# Patient Record
Sex: Female | Born: 1940 | Race: White | Hispanic: No | Smoking: Current every day smoker
Health system: Southern US, Community
[De-identification: ages and names within clinical notes are randomized; demographics above are authoritative.]

## PROBLEM LIST (undated history)

## (undated) DIAGNOSIS — E119 Type 2 diabetes mellitus without complications: Secondary | ICD-10-CM

## (undated) DIAGNOSIS — B029 Zoster without complications: Secondary | ICD-10-CM

## (undated) DIAGNOSIS — N289 Disorder of kidney and ureter, unspecified: Secondary | ICD-10-CM

## (undated) DIAGNOSIS — I509 Heart failure, unspecified: Secondary | ICD-10-CM

## (undated) DIAGNOSIS — I10 Essential (primary) hypertension: Secondary | ICD-10-CM

## (undated) HISTORY — DX: Disorder of kidney and ureter, unspecified: N28.9

## (undated) HISTORY — DX: Zoster without complications: B02.9

---

## 2010-11-26 ENCOUNTER — Other Ambulatory Visit: Payer: Self-pay | Admitting: Nephrology

## 2010-11-26 DIAGNOSIS — N181 Chronic kidney disease, stage 1: Secondary | ICD-10-CM

## 2010-12-01 ENCOUNTER — Ambulatory Visit
Admission: RE | Admit: 2010-12-01 | Discharge: 2010-12-01 | Disposition: A | Discharge status: Home / Self Care | Payer: Medicare Other | Source: Ambulatory Visit | Attending: Nephrology | Admitting: Nephrology

## 2010-12-01 DIAGNOSIS — N181 Chronic kidney disease, stage 1: Secondary | ICD-10-CM

## 2014-01-22 ENCOUNTER — Other Ambulatory Visit (HOSPITAL_BASED_OUTPATIENT_CLINIC_OR_DEPARTMENT_OTHER): Payer: Self-pay | Admitting: Family Medicine

## 2014-01-22 DIAGNOSIS — R0609 Other forms of dyspnea: Principal | ICD-10-CM

## 2014-01-24 ENCOUNTER — Ambulatory Visit (HOSPITAL_BASED_OUTPATIENT_CLINIC_OR_DEPARTMENT_OTHER)
Admission: RE | Admit: 2014-01-24 | Discharge: 2014-01-24 | Disposition: A | Discharge status: Home / Self Care | Payer: Medicare Other | Source: Ambulatory Visit | Attending: Family Medicine | Admitting: Family Medicine

## 2014-01-24 DIAGNOSIS — I509 Heart failure, unspecified: Secondary | ICD-10-CM | POA: Insufficient documentation

## 2014-01-24 DIAGNOSIS — R0602 Shortness of breath: Secondary | ICD-10-CM | POA: Diagnosis present

## 2014-01-24 DIAGNOSIS — J811 Chronic pulmonary edema: Secondary | ICD-10-CM | POA: Diagnosis not present

## 2014-01-24 DIAGNOSIS — J90 Pleural effusion, not elsewhere classified: Secondary | ICD-10-CM | POA: Diagnosis not present

## 2014-01-24 DIAGNOSIS — R0609 Other forms of dyspnea: Secondary | ICD-10-CM

## 2015-05-29 DIAGNOSIS — L0100 Impetigo, unspecified: Secondary | ICD-10-CM | POA: Insufficient documentation

## 2015-05-29 DIAGNOSIS — I5033 Acute on chronic diastolic (congestive) heart failure: Secondary | ICD-10-CM | POA: Insufficient documentation

## 2016-01-27 ENCOUNTER — Inpatient Hospital Stay (HOSPITAL_COMMUNITY): Payer: Medicare Other

## 2016-01-27 ENCOUNTER — Encounter (HOSPITAL_COMMUNITY): Payer: Self-pay | Admitting: Emergency Medicine

## 2016-01-27 ENCOUNTER — Inpatient Hospital Stay (HOSPITAL_COMMUNITY)
Admission: EM | Admit: 2016-01-27 | Discharge: 2016-02-05 | DRG: 291 | Disposition: A | Discharge status: Skilled Nursing Facility | Payer: Medicare Other | Attending: Internal Medicine | Admitting: Internal Medicine

## 2016-01-27 ENCOUNTER — Emergency Department (HOSPITAL_COMMUNITY): Payer: Medicare Other

## 2016-01-27 DIAGNOSIS — I509 Heart failure, unspecified: Secondary | ICD-10-CM | POA: Diagnosis not present

## 2016-01-27 DIAGNOSIS — F17210 Nicotine dependence, cigarettes, uncomplicated: Secondary | ICD-10-CM | POA: Diagnosis present

## 2016-01-27 DIAGNOSIS — Z6841 Body Mass Index (BMI) 40.0 and over, adult: Secondary | ICD-10-CM

## 2016-01-27 DIAGNOSIS — Z7984 Long term (current) use of oral hypoglycemic drugs: Secondary | ICD-10-CM | POA: Diagnosis not present

## 2016-01-27 DIAGNOSIS — Z7189 Other specified counseling: Secondary | ICD-10-CM

## 2016-01-27 DIAGNOSIS — I872 Venous insufficiency (chronic) (peripheral): Secondary | ICD-10-CM | POA: Diagnosis present

## 2016-01-27 DIAGNOSIS — Z9119 Patient's noncompliance with other medical treatment and regimen: Secondary | ICD-10-CM

## 2016-01-27 DIAGNOSIS — Z882 Allergy status to sulfonamides status: Secondary | ICD-10-CM

## 2016-01-27 DIAGNOSIS — I248 Other forms of acute ischemic heart disease: Secondary | ICD-10-CM | POA: Diagnosis present

## 2016-01-27 DIAGNOSIS — N170 Acute kidney failure with tubular necrosis: Secondary | ICD-10-CM | POA: Diagnosis present

## 2016-01-27 DIAGNOSIS — I130 Hypertensive heart and chronic kidney disease with heart failure and stage 1 through stage 4 chronic kidney disease, or unspecified chronic kidney disease: Secondary | ICD-10-CM | POA: Diagnosis present

## 2016-01-27 DIAGNOSIS — F4320 Adjustment disorder, unspecified: Secondary | ICD-10-CM | POA: Diagnosis not present

## 2016-01-27 DIAGNOSIS — I4892 Unspecified atrial flutter: Secondary | ICD-10-CM | POA: Diagnosis present

## 2016-01-27 DIAGNOSIS — Z515 Encounter for palliative care: Secondary | ICD-10-CM | POA: Diagnosis present

## 2016-01-27 DIAGNOSIS — G4733 Obstructive sleep apnea (adult) (pediatric): Secondary | ICD-10-CM

## 2016-01-27 DIAGNOSIS — I5033 Acute on chronic diastolic (congestive) heart failure: Secondary | ICD-10-CM

## 2016-01-27 DIAGNOSIS — N183 Chronic kidney disease, stage 3 (moderate): Secondary | ICD-10-CM | POA: Diagnosis present

## 2016-01-27 DIAGNOSIS — I5032 Chronic diastolic (congestive) heart failure: Secondary | ICD-10-CM | POA: Diagnosis not present

## 2016-01-27 DIAGNOSIS — I5023 Acute on chronic systolic (congestive) heart failure: Secondary | ICD-10-CM

## 2016-01-27 DIAGNOSIS — J441 Chronic obstructive pulmonary disease with (acute) exacerbation: Secondary | ICD-10-CM | POA: Diagnosis present

## 2016-01-27 DIAGNOSIS — E1122 Type 2 diabetes mellitus with diabetic chronic kidney disease: Secondary | ICD-10-CM | POA: Diagnosis present

## 2016-01-27 DIAGNOSIS — Z720 Tobacco use: Secondary | ICD-10-CM | POA: Diagnosis not present

## 2016-01-27 DIAGNOSIS — S81802D Unspecified open wound, left lower leg, subsequent encounter: Secondary | ICD-10-CM

## 2016-01-27 DIAGNOSIS — E118 Type 2 diabetes mellitus with unspecified complications: Secondary | ICD-10-CM | POA: Diagnosis not present

## 2016-01-27 DIAGNOSIS — Z66 Do not resuscitate: Secondary | ICD-10-CM | POA: Diagnosis present

## 2016-01-27 DIAGNOSIS — D638 Anemia in other chronic diseases classified elsewhere: Secondary | ICD-10-CM | POA: Diagnosis present

## 2016-01-27 DIAGNOSIS — N189 Chronic kidney disease, unspecified: Secondary | ICD-10-CM

## 2016-01-27 DIAGNOSIS — E039 Hypothyroidism, unspecified: Secondary | ICD-10-CM | POA: Diagnosis present

## 2016-01-27 DIAGNOSIS — Z79899 Other long term (current) drug therapy: Secondary | ICD-10-CM | POA: Diagnosis not present

## 2016-01-27 DIAGNOSIS — E0590 Thyrotoxicosis, unspecified without thyrotoxic crisis or storm: Secondary | ICD-10-CM | POA: Diagnosis present

## 2016-01-27 DIAGNOSIS — N182 Chronic kidney disease, stage 2 (mild): Secondary | ICD-10-CM

## 2016-01-27 DIAGNOSIS — I5043 Acute on chronic combined systolic (congestive) and diastolic (congestive) heart failure: Secondary | ICD-10-CM | POA: Diagnosis present

## 2016-01-27 DIAGNOSIS — I420 Dilated cardiomyopathy: Secondary | ICD-10-CM | POA: Diagnosis present

## 2016-01-27 DIAGNOSIS — L0100 Impetigo, unspecified: Secondary | ICD-10-CM | POA: Diagnosis present

## 2016-01-27 DIAGNOSIS — R0602 Shortness of breath: Secondary | ICD-10-CM

## 2016-01-27 DIAGNOSIS — I272 Pulmonary hypertension, unspecified: Secondary | ICD-10-CM | POA: Diagnosis present

## 2016-01-27 DIAGNOSIS — I5022 Chronic systolic (congestive) heart failure: Secondary | ICD-10-CM

## 2016-01-27 DIAGNOSIS — J9601 Acute respiratory failure with hypoxia: Secondary | ICD-10-CM | POA: Diagnosis present

## 2016-01-27 DIAGNOSIS — E662 Morbid (severe) obesity with alveolar hypoventilation: Secondary | ICD-10-CM | POA: Diagnosis present

## 2016-01-27 DIAGNOSIS — E119 Type 2 diabetes mellitus without complications: Secondary | ICD-10-CM

## 2016-01-27 DIAGNOSIS — S81802A Unspecified open wound, left lower leg, initial encounter: Secondary | ICD-10-CM

## 2016-01-27 HISTORY — DX: Type 2 diabetes mellitus without complications: E11.9

## 2016-01-27 HISTORY — DX: Essential (primary) hypertension: I10

## 2016-01-27 HISTORY — DX: Disorder of kidney and ureter, unspecified: N28.9

## 2016-01-27 HISTORY — DX: Heart failure, unspecified: I50.9

## 2016-01-27 LAB — POCT I-STAT 3, ART BLOOD GAS (G3+)
Acid-Base Excess: 4 mmol/L — ABNORMAL HIGH (ref 0.0–2.0)
BICARBONATE: 28 mmol/L (ref 20.0–28.0)
O2 Saturation: 89 %
PH ART: 7.483 — AB (ref 7.350–7.450)
TCO2: 29 mmol/L (ref 0–100)
pCO2 arterial: 37.4 mmHg (ref 32.0–48.0)
pO2, Arterial: 52 mmHg — ABNORMAL LOW (ref 83.0–108.0)

## 2016-01-27 LAB — I-STAT TROPONIN, ED: Troponin i, poc: 0.03 ng/mL (ref 0.00–0.08)

## 2016-01-27 LAB — CBC AND DIFFERENTIAL
HCT: 32 % — AB (ref 36–46)
HEMOGLOBIN: 9.4 g/dL — AB (ref 12.0–16.0)
Platelets: 399 10*3/uL (ref 150–399)
WBC: 9.1 10^3/mL

## 2016-01-27 LAB — TROPONIN I
TROPONIN I: 0.04 ng/mL — AB (ref <0.03)
Troponin I: 0.04 ng/mL (ref <0.03)

## 2016-01-27 LAB — BASIC METABOLIC PANEL
ANION GAP: 7 (ref 5–15)
BUN: 43 mg/dL — AB (ref 4–21)
BUN: 43 mg/dL — ABNORMAL HIGH (ref 6–20)
CHLORIDE: 98 mmol/L — AB (ref 101–111)
CO2: 31 mmol/L (ref 22–32)
Calcium: 8.4 mg/dL — ABNORMAL LOW (ref 8.9–10.3)
Creatinine, Ser: 1.71 mg/dL — ABNORMAL HIGH (ref 0.44–1.00)
Creatinine: 1.7 mg/dL — AB (ref 0.5–1.1)
GFR calc Af Amer: 32 mL/min — ABNORMAL LOW (ref >60)
GFR, EST NON AFRICAN AMERICAN: 28 mL/min — AB (ref >60)
GLUCOSE: 111 mg/dL — AB (ref 65–99)
Glucose: 111 mg/dL
POTASSIUM: 3.8 mmol/L (ref 3.4–5.3)
POTASSIUM: 3.8 mmol/L (ref 3.5–5.1)
SODIUM: 136 mmol/L — AB (ref 137–147)
Sodium: 136 mmol/L (ref 135–145)

## 2016-01-27 LAB — I-STAT ARTERIAL BLOOD GAS, ED
Acid-Base Excess: 2 mmol/L (ref 0.0–2.0)
Bicarbonate: 28.5 mmol/L — ABNORMAL HIGH (ref 20.0–28.0)
O2 Saturation: 92 %
PCO2 ART: 50.3 mmHg — AB (ref 32.0–48.0)
Patient temperature: 98.4
TCO2: 30 mmol/L (ref 0–100)
pH, Arterial: 7.361 (ref 7.350–7.450)
pO2, Arterial: 66 mmHg — ABNORMAL LOW (ref 83.0–108.0)

## 2016-01-27 LAB — CBC WITH DIFFERENTIAL/PLATELET
Basophils Absolute: 0 10*3/uL (ref 0.0–0.1)
Basophils Relative: 0 %
Eosinophils Absolute: 0.1 10*3/uL (ref 0.0–0.7)
Eosinophils Relative: 2 %
HEMATOCRIT: 31.5 % — AB (ref 36.0–46.0)
Hemoglobin: 9.4 g/dL — ABNORMAL LOW (ref 12.0–15.0)
LYMPHS ABS: 1.1 10*3/uL (ref 0.7–4.0)
LYMPHS PCT: 12 %
MCH: 24.7 pg — AB (ref 26.0–34.0)
MCHC: 29.8 g/dL — AB (ref 30.0–36.0)
MCV: 82.9 fL (ref 78.0–100.0)
MONO ABS: 0.9 10*3/uL (ref 0.1–1.0)
MONOS PCT: 10 %
NEUTROS ABS: 6.9 10*3/uL (ref 1.7–7.7)
Neutrophils Relative %: 76 %
Platelets: 399 10*3/uL (ref 150–400)
RBC: 3.8 MIL/uL — ABNORMAL LOW (ref 3.87–5.11)
RDW: 19.4 % — AB (ref 11.5–15.5)
WBC: 9.1 10*3/uL (ref 4.0–10.5)

## 2016-01-27 LAB — GLUCOSE, CAPILLARY
GLUCOSE-CAPILLARY: 64 mg/dL — AB (ref 65–99)
GLUCOSE-CAPILLARY: 67 mg/dL (ref 65–99)
Glucose-Capillary: 90 mg/dL (ref 65–99)
Glucose-Capillary: 99 mg/dL (ref 65–99)

## 2016-01-27 LAB — ECHOCARDIOGRAM COMPLETE
Height: 60 in
Weight: 4548.8 oz

## 2016-01-27 LAB — TSH: TSH: 6.845 u[IU]/mL — ABNORMAL HIGH (ref 0.350–4.500)

## 2016-01-27 LAB — LACTIC ACID, PLASMA
LACTIC ACID, VENOUS: 0.8 mmol/L (ref 0.5–1.9)
Lactic Acid, Venous: 1 mmol/L (ref 0.5–1.9)

## 2016-01-27 LAB — BRAIN NATRIURETIC PEPTIDE: B Natriuretic Peptide: 512.7 pg/mL — ABNORMAL HIGH (ref 0.0–100.0)

## 2016-01-27 LAB — PREALBUMIN: PREALBUMIN: 21.3 mg/dL (ref 18–38)

## 2016-01-27 LAB — MRSA PCR SCREENING: MRSA BY PCR: NEGATIVE

## 2016-01-27 MED ORDER — DOCUSATE SODIUM 100 MG PO CAPS
100.0000 mg | ORAL_CAPSULE | Freq: Two times a day (BID) | ORAL | Status: DC | PRN
  Administered 2016-01-31: 100 mg via ORAL
  Filled 2016-01-27 (×2): qty 1

## 2016-01-27 MED ORDER — ACETAMINOPHEN 325 MG PO TABS
650.0000 mg | ORAL_TABLET | ORAL | Status: DC | PRN
  Administered 2016-01-28: 650 mg via ORAL
  Filled 2016-01-27: qty 2

## 2016-01-27 MED ORDER — AMLODIPINE BESYLATE 10 MG PO TABS
10.0000 mg | ORAL_TABLET | Freq: Every day | ORAL | Status: DC
  Administered 2016-01-28: 10 mg via ORAL
  Filled 2016-01-27: qty 1

## 2016-01-27 MED ORDER — CHLORHEXIDINE GLUCONATE 0.12 % MT SOLN
15.0000 mL | Freq: Two times a day (BID) | OROMUCOSAL | Status: DC
  Administered 2016-01-27 – 2016-02-04 (×14): 15 mL via OROMUCOSAL
  Filled 2016-01-27 (×16): qty 15

## 2016-01-27 MED ORDER — SODIUM CHLORIDE 0.9 % IV SOLN: 250.0000 mL | INTRAVENOUS | Status: DC | PRN

## 2016-01-27 MED ORDER — POTASSIUM CHLORIDE CRYS ER 20 MEQ PO TBCR
40.0000 meq | EXTENDED_RELEASE_TABLET | Freq: Two times a day (BID) | ORAL | Status: DC
  Administered 2016-01-27 – 2016-02-05 (×17): 40 meq via ORAL
  Filled 2016-01-27 (×19): qty 2

## 2016-01-27 MED ORDER — DEXTROSE 50 % IV SOLN
INTRAVENOUS | Status: AC
  Administered 2016-01-27: 50 mL
  Filled 2016-01-27: qty 50

## 2016-01-27 MED ORDER — HYDRALAZINE HCL 25 MG PO TABS
25.0000 mg | ORAL_TABLET | Freq: Three times a day (TID) | ORAL | Status: DC
  Administered 2016-01-27 – 2016-02-05 (×25): 25 mg via ORAL
  Filled 2016-01-27 (×28): qty 1

## 2016-01-27 MED ORDER — ALPRAZOLAM 0.25 MG PO TABS
0.2500 mg | ORAL_TABLET | Freq: Two times a day (BID) | ORAL | Status: DC | PRN
  Administered 2016-01-28 – 2016-01-31 (×6): 0.25 mg via ORAL
  Filled 2016-01-27 (×10): qty 1

## 2016-01-27 MED ORDER — FUROSEMIDE 10 MG/ML IJ SOLN: 120.0000 mg | Freq: Two times a day (BID) | INTRAVENOUS | Status: DC

## 2016-01-27 MED ORDER — FUROSEMIDE 10 MG/ML IJ SOLN
120.0000 mg | Freq: Two times a day (BID) | INTRAVENOUS | Status: DC
  Administered 2016-01-27 – 2016-01-29 (×4): 120 mg via INTRAVENOUS
  Filled 2016-01-27 (×6): qty 12

## 2016-01-27 MED ORDER — SODIUM CHLORIDE 0.9% FLUSH: 3.0000 mL | INTRAVENOUS | Status: DC | PRN

## 2016-01-27 MED ORDER — DEXTROSE 5 % IV SOLN
120.0000 mg | Freq: Two times a day (BID) | INTRAVENOUS | Status: DC
  Filled 2016-01-27: qty 12

## 2016-01-27 MED ORDER — HEPARIN SODIUM (PORCINE) 5000 UNIT/ML IJ SOLN
5000.0000 [IU] | Freq: Three times a day (TID) | INTRAMUSCULAR | Status: DC
  Administered 2016-01-27 – 2016-02-05 (×23): 5000 [IU] via SUBCUTANEOUS
  Filled 2016-01-27 (×23): qty 1

## 2016-01-27 MED ORDER — FUROSEMIDE 10 MG/ML IJ SOLN
80.0000 mg | INTRAMUSCULAR | Status: AC
  Administered 2016-01-27: 80 mg via INTRAVENOUS
  Filled 2016-01-27: qty 8

## 2016-01-27 MED ORDER — SODIUM CHLORIDE 0.9% FLUSH
3.0000 mL | Freq: Two times a day (BID) | INTRAVENOUS | Status: DC
  Administered 2016-01-27 – 2016-01-29 (×5): 3 mL via INTRAVENOUS

## 2016-01-27 MED ORDER — CEPHALEXIN 500 MG PO CAPS
500.0000 mg | ORAL_CAPSULE | Freq: Two times a day (BID) | ORAL | Status: DC
  Administered 2016-01-27 – 2016-02-01 (×10): 500 mg via ORAL
  Filled 2016-01-27 (×11): qty 1

## 2016-01-27 MED ORDER — FUROSEMIDE 10 MG/ML IJ SOLN
40.0000 mg | Freq: Once | INTRAMUSCULAR | Status: AC
  Administered 2016-01-27: 40 mg via INTRAVENOUS
  Filled 2016-01-27: qty 4

## 2016-01-27 MED ORDER — ORAL CARE MOUTH RINSE
15.0000 mL | Freq: Two times a day (BID) | OROMUCOSAL | Status: DC
  Administered 2016-01-27 – 2016-02-02 (×12): 15 mL via OROMUCOSAL

## 2016-01-27 MED ORDER — ASPIRIN EC 81 MG PO TBEC
81.0000 mg | DELAYED_RELEASE_TABLET | Freq: Every day | ORAL | Status: DC
  Administered 2016-01-28 – 2016-02-05 (×9): 81 mg via ORAL
  Filled 2016-01-27 (×10): qty 1

## 2016-01-27 MED ORDER — ONDANSETRON HCL 4 MG/2ML IJ SOLN: 4.0000 mg | Freq: Four times a day (QID) | INTRAMUSCULAR | Status: DC | PRN

## 2016-01-27 MED ORDER — INSULIN ASPART 100 UNIT/ML ~~LOC~~ SOLN
0.0000 [IU] | Freq: Three times a day (TID) | SUBCUTANEOUS | Status: DC
  Administered 2016-01-29 (×2): 2 [IU] via SUBCUTANEOUS
  Administered 2016-01-30: 1 [IU] via SUBCUTANEOUS
  Administered 2016-01-30: 2 [IU] via SUBCUTANEOUS
  Administered 2016-01-30: 3 [IU] via SUBCUTANEOUS
  Administered 2016-01-31: 1 [IU] via SUBCUTANEOUS
  Administered 2016-01-31 – 2016-02-01 (×2): 3 [IU] via SUBCUTANEOUS
  Administered 2016-02-01: 2 [IU] via SUBCUTANEOUS
  Administered 2016-02-02: 3 [IU] via SUBCUTANEOUS
  Administered 2016-02-03 (×2): 2 [IU] via SUBCUTANEOUS
  Administered 2016-02-05 (×2): 1 [IU] via SUBCUTANEOUS

## 2016-01-27 NOTE — Progress Notes (Signed)
Orthopedic Tech Progress Note Patient Details:  Sue MacleodCarolyn Roach 07/10/1940 [id number]  Ortho Devices Type of Ortho Device: Roland RackUnna boot Ortho Device/Splint Location: lle Ortho Device/Splint Interventions: Application   Ashana Tullo 01/27/2016, 3:09 PM

## 2016-01-27 NOTE — Progress Notes (Signed)
CSW consult acknowledged re: "To provide home health nursing upon discharge". CSW to defer to RN Case Manager to assist with home health needs. This CSW signing off. Please contact should new need(s) arise.           Enos FlingAshley Shellie Goettl, MSW, LCSW [facility] ED/40M Clinical Social Worker [phone]

## 2016-01-27 NOTE — ED Triage Notes (Addendum)
Patient daughter called EMS from home.  Patient was at 61% O2 sat on room air when they arrived.  Per EMS, placed patient directly on CPAP and sats came up within 5 minutes to 83%.   Patient on arrival was 95% on CPAP.   EMS placed 22 G L hand en route.  Patient refused to be undressed and wants to keep her sweat clothes on.

## 2016-01-27 NOTE — H&P (Signed)
History and Physical    Sue Roach ZOX:[id number] DOB: 01/07/1941 DOA: 01/27/2016   PCP: Kaleen MaskELKINS,WILSON OLIVER, MD   Patient coming from:  Home    Chief Complaint: Shortness of breath   HPI: Sue Roach is a 75 y.o. female with medical history significant for chronic diastolic congestive heart failure, with prior hospitalization in April 2017 with similar symptoms as of today, presenting with acute episode of shortness of breath.  The patient is not on oxygen at home. When  EMS arrived, she was slumped over in the chair, minimally responsive. Her O2 sats were 61% in room air. EMS applied 4 L with improvement to 83%. Her level of consciousness at the time improved, so she was able to get out of her chair, and walked to the front door. However, she was increasingly fatigued, and was started on CPAP, with immediate improvement of symptoms. The patient had been seen by her PCP 2 days prior, at which time the PCP did not deem necessary to change any of her CHF meds. The patient was taking Lasix twice a day, sometimes 3. However, her symptoms appear to worsen . Her O2 sats with BiPAP, in prove to 94%, but she was unable to be weaned off. The patient according to her daughter has had a 10 to 12 pound weight gain, with some decreased urine output recently. She also has noted increase in lower extremity edema, in addition to impetigo of the left leg, currently dressed in bandages. Moreover, she has lower extremity edema occur right as well. According to her daughter, the patient cannot walk more than 20 steps without becoming short of breath. She is unable to walk upstairs to her bedroom. No apartment chest pain or palpitations. She did CAD intermittent nausea without vomiting, no diarrhea or abdominal pain.  No fever, chills, night sweats or bleeding issues reported . She is being admitted for further management of her symptoms, and further evaluation.  ED Course:  BP 113/55   Pulse 63    Temp 98.4 F (36.9 C) (Axillary)   Resp 15   Ht 5' (1.524 m)   Wt 129.3 kg (285 lb)   SpO2 91%   BMI 55.66 kg/m     Received 80 mg IV Lasix at 9:45 am   CXR with mild CHF with cardiomegaly and pulmonary vascular congestion Weight  129.3 kg (per daughter, 10-12 lbs more than her BL )  O2 sats  Were in the 60s, placed on NRB , unable to wean her at this time    ECHO in 05/2015 (TEE) EF 55 .  BNP 512.7 troponin 0.03  white count normal at 9.1.  Bicarb 31.    Review of Systems: As per HPI otherwise 10 point review of systems negative.   Past Medical History:  Diagnosis Date  . CHF (congestive heart failure) (HCC)   . Diabetes mellitus without complication (HCC)   . Hypertension   . Renal disorder     No past surgical history on file.  Social History Social History   Social History  . Marital status: Widowed    Spouse name: N/A  . Number of children: N/A  . Years of education: N/A   Occupational History  . Not on file.   Social History Main Topics  . Smoking status: Current Every Day Smoker    Packs/day: 1.00    Types: Cigarettes  . Smokeless tobacco: Not on file  . Alcohol use No  . Drug use: No  . Sexual  activity: Not on file   Other Topics Concern  . Not on file   Social History Narrative  . No narrative on file     Allergies  Allergen Reactions  . Sulfa Antibiotics     No family history on file.    Prior to Admission medications   Medication Sig Start Date End Date Taking? Authorizing Provider  amLODipine (NORVASC) 10 MG tablet Take 10 mg by mouth daily. 11/03/15  Yes Historical Provider, MD  aspirin EC 81 MG tablet Take 81 mg by mouth daily.   Yes Historical Provider, MD  docusate sodium (COLACE) 100 MG capsule Take 100 mg by mouth 2 (two) times daily as needed for constipation.   Yes Historical Provider, MD  furosemide (LASIX) 80 MG tablet Take 80 mg by mouth every 12 (twelve) hours. Max 3 tabs in 24 hours 12/21/15  Yes Historical Provider, MD    glimepiride (AMARYL) 4 MG tablet Take 8 mg by mouth every morning. 12/28/15  Yes Historical Provider, MD  hydrALAZINE (APRESOLINE) 25 MG tablet Take 25 mg by mouth 3 (three) times daily. 11/04/15  Yes Historical Provider, MD  metFORMIN (GLUCOPHAGE) 1000 MG tablet Take 1,000 mg by mouth 2 (two) times daily.   Yes Historical Provider, MD  pioglitazone (ACTOS) 45 MG tablet Take 45 mg by mouth daily. 11/20/15  Yes Historical Provider, MD  traMADol (ULTRAM) 50 MG tablet Take 25 mg by mouth every 8 (eight) hours as needed for fluid. 01/25/16  Yes Historical Provider, MD    Physical Exam:    Vitals:   01/27/16 0927 01/27/16 0930 01/27/16 0945 01/27/16 1015  BP: 132/58  116/55 113/55  Pulse: 66  63 63  Resp: 16  18 15   Temp:      TempSrc:      SpO2: 94% 93% 92% 91%  Weight:      Height:           Constitutional: NAD, calm, comfortable on BiPAP, sound asleep  Vitals:   01/27/16 0927 01/27/16 0930 01/27/16 0945 01/27/16 1015  BP: 132/58  116/55 113/55  Pulse: 66  63 63  Resp: 16  18 15   Temp:      TempSrc:      SpO2: 94% 93% 92% 91%  Weight:      Height:       Eyes: PERRL, lids and conjunctivae normal ENMT: patient on BiPAP, unable to assess Neck: normal, supple, no masses, no thyromegaly Respiratory:  Rhonchi and crackle bilaterally, no wheezing  Normal respiratory effort. No accessory muscle use.  Cardiovascular: Regular rate and rhythm, no murmurs / rubs / gallops. 2-3 + pitting extremity edema. LLE with wound dressing .  2+ pedal pulses. No carotid bruits.  Abdomen:  Morbidly obese no tenderness, no masses palpated. No hepatosplenomegaly. Bowel sounds positive.  Musculoskeletal: no clubbing / cyanosis. No joint deformity upper and lower extremities.  Skin:Remarkable for left lower extremity with bandage due to leg went, right lower extremity with some impetigo noted, and [facility] orders report, known right sacral wound Neurologic: Unable to assess, patient is sound asleep at this  time     Labs on Admission: I have personally reviewed following labs and imaging studies  CBC:  Recent Labs Lab 01/27/16 0858  WBC 9.1  NEUTROABS 6.9  HGB 9.4*  HCT 31.5*  MCV 82.9  PLT 399    Basic Metabolic Panel:  Recent Labs Lab 01/27/16 0858  NA 136  K 3.8  CL 98*  CO2 31  GLUCOSE 111*  BUN 43*  CREATININE 1.71*  CALCIUM 8.4*    GFR: Estimated Creatinine Clearance: 34.9 mL/min (by C-G formula based on SCr of 1.71 mg/dL (H)).  Liver Function Tests: No results for input(s): AST, ALT, ALKPHOS, BILITOT, PROT, ALBUMIN in the last 168 hours. No results for input(s): LIPASE, AMYLASE in the last 168 hours. No results for input(s): AMMONIA in the last 168 hours.  Coagulation Profile: No results for input(s): INR, PROTIME in the last 168 hours.  Cardiac Enzymes: No results for input(s): CKTOTAL, CKMB, CKMBINDEX, TROPONINI in the last 168 hours.  BNP (last 3 results) No results for input(s): PROBNP in the last 8760 hours.  HbA1C: No results for input(s): HGBA1C in the last 72 hours.  CBG: No results for input(s): GLUCAP in the last 168 hours.  Lipid Profile: No results for input(s): CHOL, HDL, LDLCALC, TRIG, CHOLHDL, LDLDIRECT in the last 72 hours.  Thyroid Function Tests: No results for input(s): TSH, T4TOTAL, FREET4, T3FREE, THYROIDAB in the last 72 hours.  Anemia Panel: No results for input(s): VITAMINB12, FOLATE, FERRITIN, TIBC, IRON, RETICCTPCT in the last 72 hours.  Urine analysis: No results found for: COLORURINE, APPEARANCEUR, LABSPEC, PHURINE, GLUCOSEU, HGBUR, BILIRUBINUR, KETONESUR, PROTEINUR, UROBILINOGEN, NITRITE, LEUKOCYTESUR  Sepsis Labs: @LABRCNTIP (procalcitonin:4,lacticidven:4) )No results found for this or any previous visit (from the past 240 hour(s)).   Radiological Exams on Admission: Dg Chest Port 1 View  Result Date: 01/27/2016 CLINICAL DATA:  Short of breath, low oxygen saturation, smoking history EXAM: PORTABLE CHEST 1  VIEW COMPARISON:  Chest x-ray of 01/24/2014 FINDINGS: The lungs are poorly aerated. There is underpenetration of the lung bases, and the left lung base in particular cannot be well evaluated. Pneumonia or atelectasis cannot be excluded at the left lung base. There is cardiomegaly present with mild pulmonary vascular congestion. No bony abnormality is seen. IMPRESSION: Poor inspiration film with poor penetration. Probable mild CHF with cardiomegaly and pulmonary vascular congestion. Cannot evaluate the left lung base. Electronically Signed   By: Dwyane Dee M.D.   On: 01/27/2016 09:12    EKG: Independently reviewed.  Assessment/Plan Active Problems:   CHF (congestive heart failure) (HCC)   Acute respiratory failure with hypoxia (HCC)   Chronic kidney disease   Diabetes mellitus, type II (HCC)   Leg wound, left   Anemia of chronic disease   Tobacco abuse  Acute respiratory failure with hypoxia likely due to  Acute on chronic diastolic CHF exacerbation with undiagnosed underlying COPD . Received 80 mg IV Lasix at 9:45 am  CXR with mild CHF with cardiomegaly and pulmonary vascular congestion.  Tn neg  Weight  129.3 kg (per daughter, 10-12 lbs more than her BL )  O2 sats  Were in the 60s, placed on NRB , unable to wean her at this time   ECHO in 05/2015 (TEE) EF 55 . BNP 512.7 troponin 0.03 white count normal at 9.1. Bicarb 31. Lactic acid pending  Stepdown  CHF order set  TSH   Cycle troponins  Daily weights and strict I/O  ECHO   Will increase her Lasix dose to 120 bid, with 40 of Kdur  Bladder scan to monitor residual   Continue aspirin  2 gm sodium diet with   fluid restriction Will need CHF OP follow up  OP PFT to monitor for COPD   Hypertension BP 132/58  Pulse 66  Controlled, at her baseline  Continue home anti-hypertensive medications    Type II Diabetes Current blood sugar level is  111 No results found for: HGBA1C Hgb A1C Hold home oral diabetic medications.   SSI    Acute  on Chronic kidney disease stage 3  baseline creatinine  1.5-1.6     Current Cr 1.7  Lab Results  Component Value Date   CREATININE 1.71 (H) 01/27/2016  Will hold IVF at this time  Repeat CMET in am  Anemia of chronic disease Hemoglobin on admission 9, at baseline  . No bleeding issues reported  Repeat CBC in am Start Iron supplement   . Tobacco abuse  Unable to determine amount of cigarettes a day at this time  -  Nicotine patch  prn   LLE  And R sacral wound.   Wearing una boot  on her LLE  Keflex   Wound care consult for replacement of  UNA boot prior to her discharge and for the care of her R sacral wound   Deconditioning. OT/PT consult Check PAB, may need nutritional consultati9n   Social: patient lives alone, will benefit from home health nursing, due to the multitudes of medical issues, and unknown compliance with medications.  DVT prophylaxis Heparin   Code Status:   DNR  Family Communication:  Discussed with patient Disposition Plan: Expect patient to be discharged to home after condition improves Consults called:    None Admission status:  Inpatient SDU    [facility] E, PA-C [facility]   01/27/2016, 10:45 AM

## 2016-01-27 NOTE — Consult Note (Signed)
WOC Nurse wound consult note Reason for Consult: LLE unna's boot and R sacral wound However when I arrived to the unit patient denies sacral wound and refuses to be turned.  When I talked with 2 bedside nurses that admitted the patient to the unit and completed the skin assessment they both state the patient has no wounds on the sacrum. Wound type: Venous stasis LLE Wound bed: venous stasis dermatitis, with evidence of some skin peeling but no active draining open wounds, only some drainage on the Unna's boot most likely from weeping. Palpable pulses  Drainage (amount, consistency, odor) no active drainage Periwound: venous stasis dermatitis and hemosiderin staining. Dressing procedure/placement/frequency: Orthopedic tech to replace Unna's boot to the LLE, change weekly on Wednesdays. Resume HHRN for or MD for weekly changes.  Discussed POC with patient and bedside nurse.  Re consult if needed, will not follow at this time. Thanks  [place] [employer]a M[employer] MSN, RN,CWOCN, CNS [phone])

## 2016-01-27 NOTE — Care Management Note (Signed)
Case Management Note  Patient Details  Name: Sue Roach MRN: [id number] Date of Birth: 1941/01/24  Subjective/Objective:                  75 y.o. female with medical history significant for chronic diastolic congestive heart failure, with prior hospitalization in April 2017 with similar symptoms as of today, presenting with acute respiratory distress. The patient is not on oxygen at home. Went EMS arrived, she was slumped over in the chair, minimally responsive. / From home.  Action/Plan: Follow for disposition needs.   Expected Discharge Date:  01/30/16               Expected Discharge Plan:  Home w Home Health Services; DME  In-House Referral:  NA  Discharge planning Services  CM Consult  Post Acute Care Choice:    [facility]     Status of Service:  In process, will continue to follow    Additional Comments:  Sue Roach, Sue Vahey, RN 01/27/2016, 11:09 AM

## 2016-01-27 NOTE — ED Provider Notes (Signed)
[facility]-EMERGENCY DEPT Provider Note   CSN: [id number] Arrival date & time: 01/27/16  [id number]     History   Chief Complaint Chief Complaint  Patient presents with  . Respiratory Distress    HPI Sue Roach is a 75 y.o. female.  The history is provided by medical records.    75 year old female with history of congestive heart failure with EF estimated at 55% by echo in April 2017, HTN, presenting to the ED for SOB.  EMS was called out this morning for respiratory distress. When they arrived at her home she was slumped over in the chair and minimally responsive. Her O2 sats were 61% on RA.  They applied 4L with improvement to 83%.  Her level of consciousness improved and she was able to get out of a chair and walk to the front door, however with very fatigued and increasingly short of breath after this. Patient was started on CPAP with immediate improvement of her symptoms, after about 5-10 minutes she reported she was feeling significantly better. She denies any fever. She has had a recent cough. Patient does have impetigo of the left leg, currently dressed in bandages.  States now she has pain in her right leg as well.  Currently, patient is awake, alert, oriented.  She is able to talk through the bipap in short sentences.  Her O2 sats are around 94% on bipap.  She does have significant pitting edema of the legs.  She does take high dose lasix, 80 mg BID-TID.  states she saw her PCP on Monday and told him that she did not feel her medications were working.  Reports 10-12 lbs weight gain, decreased urine output recently.  She does not use home O2.  She is not followed by cardiology for her CHF.  No past medical history on file.  There are no active problems to display for this patient.   No past surgical history on file.  OB History    No data available       Home Medications    Prior to Admission medications   Not on File    Family History No family history on  file.  Social History Social History  Substance Use Topics  . Smoking status: Not on file  . Smokeless tobacco: Not on file  . Alcohol use Not on file     Allergies   Patient has no allergy information on record.   Review of Systems Review of Systems  Respiratory: Positive for cough and shortness of breath.   Cardiovascular: Positive for leg swelling.  All other systems reviewed and are negative.    Physical Exam Updated Vital Signs BP (!) 152/52 (BP Location: Right Arm)   Pulse 76   Temp 98.4 F (36.9 C) (Axillary)   Resp 20   SpO2 95%   Physical Exam  Constitutional: She is oriented to person, place, and time. She appears well-developed and well-nourished.  HENT:  Head: Normocephalic and atraumatic.  Mouth/Throat: Oropharynx is clear and moist.  Eyes: Conjunctivae and EOM are normal. Pupils are equal, round, and reactive to light.  Neck: Normal range of motion.  Cardiovascular: Normal rate, regular rhythm and normal heart sounds.   Pulmonary/Chest: Effort normal and breath sounds normal.  Slightly increased work of breathing, appears comfortable on the bipap, able to speak in short sentences through bipap  Abdominal: Soft. Bowel sounds are normal.  Musculoskeletal: Normal range of motion.  2+ pitting edema of the legs, left leg in wound dressing,  right leg slightly erythematous and warm to touch; some areas appear to look like impetigo; no active drainage; no tissue crepitus  Neurological: She is alert and oriented to person, place, and time.  Skin: Skin is warm and dry.  Psychiatric: She has a normal mood and affect.  Nursing note and vitals reviewed.    ED Treatments / Results  Labs (all labs ordered are listed, but only abnormal results are displayed) Labs Reviewed  CBC WITH DIFFERENTIAL/PLATELET - Abnormal; Notable for the following:       Result Value   RBC 3.80 (*)    Hemoglobin 9.4 (*)    HCT 31.5 (*)    MCH 24.7 (*)    MCHC 29.8 (*)    RDW 19.4  (*)    All other components within normal limits  BRAIN NATRIURETIC PEPTIDE - Abnormal; Notable for the following:    B Natriuretic Peptide 512.7 (*)    All other components within normal limits  BASIC METABOLIC PANEL - Abnormal; Notable for the following:    Chloride 98 (*)    Glucose, Bld 111 (*)    BUN 43 (*)    Creatinine, Ser 1.71 (*)    Calcium 8.4 (*)    GFR calc non Af Amer 28 (*)    GFR calc Af Amer 32 (*)    All other components within normal limits  I-STAT ARTERIAL BLOOD GAS, ED - Abnormal; Notable for the following:    pCO2 arterial 50.3 (*)    pO2, Arterial 66.0 (*)    Bicarbonate 28.5 (*)    All other components within normal limits  I-STAT TROPOININ, ED    EKG  EKG Interpretation None       Radiology No results found.  Procedures Procedures (including critical care time)  CRITICAL CARE Performed by: Garlon Hatchet   Total critical care time: 50 minutes  Critical care time was exclusive of separately billable procedures and treating other patients.  Critical care was necessary to treat or prevent imminent or life-threatening deterioration.  Critical care was time spent personally by me on the following activities: development of treatment plan with patient and/or surrogate as well as nursing, discussions with consultants, evaluation of patient's response to treatment, examination of patient, obtaining history from patient or surrogate, ordering and performing treatments and interventions, ordering and review of laboratory studies, ordering and review of radiographic studies, pulse oximetry and re-evaluation of patient's condition.   Medications Ordered in ED Medications  furosemide (LASIX) injection 40 mg (not administered)  amLODipine (NORVASC) tablet 10 mg (not administered)  aspirin EC tablet 81 mg (not administered)  docusate sodium (COLACE) capsule 100 mg (not administered)  hydrALAZINE (APRESOLINE) tablet 25 mg (not administered)  sodium  chloride flush (NS) 0.9 % injection 3 mL (not administered)  sodium chloride flush (NS) 0.9 % injection 3 mL (not administered)  0.9 %  sodium chloride infusion (not administered)  acetaminophen (TYLENOL) tablet 650 mg (not administered)  ondansetron (ZOFRAN) injection 4 mg (not administered)  heparin injection 5,000 Units (not administered)  potassium chloride SA (K-DUR,KLOR-CON) CR tablet 40 mEq (not administered)  ALPRAZolam (XANAX) tablet 0.25 mg (not administered)  insulin aspart (novoLOG) injection 0-9 Units (not administered)  furosemide (LASIX) 120 mg in dextrose 5 % 50 mL IVPB (not administered)  cephALEXin (KEFLEX) capsule 500 mg (not administered)  furosemide (LASIX) injection 80 mg (80 mg Intravenous Given 01/27/16 0944)     Initial Impression / Assessment and Plan / ED Course  I  have reviewed the triage vital signs and the nursing notes.  Pertinent labs & imaging results that were available during my care of the patient were reviewed by me and considered in my medical decision making (see chart for details).  Clinical Course    75 year old female here with shortness of breath. She was hypoxic in the field at a low of 61% on room air, has improved significantly with placement of CPAP. She was transitioned to BiPAP on her arrival. She is awake, alert, oriented. She is able to talk in short sentences with the BiPAP. She does have significant edema of her legs and reports a 10-12 pound weight gain over the past few days despite taking her Lasix as directed. Patient does appear fluid overloaded and workup is consistent with such. Her creatinine is 1.7 which is baseline. She is anemic with a hemoglobin of 9.4 which is also baseline. Chest x-ray with vascular congestion, BNP elevated at 512. Patient was given dose of IV Lasix. She remains calm on the BiPAP. Will admit for ongoing diuresis.  Discussed with hospitalist service, will admit for ongoing care.  Inpatient, step-down.  Temp  admit orders placed.  Final Clinical Impressions(s) / ED Diagnoses   Final diagnoses:  Acute on chronic congestive heart failure, unspecified congestive heart failure type (HCC)  Shortness of breath    New Prescriptions New Prescriptions   No medications on file     Garlon HatchetLisa M Tijuana Scheidegger, Cordelia Poche-C 01/27/16 1116    Laurence Spatesachel Morgan Little, MD 01/27/16 1146

## 2016-01-27 NOTE — ED Notes (Signed)
Tried to call report to 4N.   RN in a room and will c/b when she is able to get report.

## 2016-01-27 NOTE — Progress Notes (Signed)
Pt. Was transported to 4N12 without any complications.

## 2016-01-27 NOTE — Progress Notes (Signed)
  Echocardiogram 2D Echocardiogram has been performed.  Janalyn HarderWest, Donovon Micheletti R 01/27/2016, 2:55 PM

## 2016-01-28 ENCOUNTER — Inpatient Hospital Stay (HOSPITAL_COMMUNITY): Payer: Medicare Other

## 2016-01-28 DIAGNOSIS — I5022 Chronic systolic (congestive) heart failure: Secondary | ICD-10-CM

## 2016-01-28 DIAGNOSIS — J441 Chronic obstructive pulmonary disease with (acute) exacerbation: Secondary | ICD-10-CM

## 2016-01-28 DIAGNOSIS — N182 Chronic kidney disease, stage 2 (mild): Secondary | ICD-10-CM

## 2016-01-28 DIAGNOSIS — E118 Type 2 diabetes mellitus with unspecified complications: Secondary | ICD-10-CM

## 2016-01-28 DIAGNOSIS — N170 Acute kidney failure with tubular necrosis: Secondary | ICD-10-CM

## 2016-01-28 DIAGNOSIS — I272 Pulmonary hypertension, unspecified: Secondary | ICD-10-CM

## 2016-01-28 DIAGNOSIS — G4733 Obstructive sleep apnea (adult) (pediatric): Secondary | ICD-10-CM

## 2016-01-28 DIAGNOSIS — E662 Morbid (severe) obesity with alveolar hypoventilation: Secondary | ICD-10-CM

## 2016-01-28 DIAGNOSIS — I420 Dilated cardiomyopathy: Secondary | ICD-10-CM

## 2016-01-28 LAB — BASIC METABOLIC PANEL
Anion gap: 10 (ref 5–15)
BUN: 43 mg/dL — AB (ref 6–20)
CHLORIDE: 100 mmol/L — AB (ref 101–111)
CO2: 28 mmol/L (ref 22–32)
CREATININE: 1.64 mg/dL — AB (ref 0.44–1.00)
Calcium: 8.2 mg/dL — ABNORMAL LOW (ref 8.9–10.3)
GFR calc Af Amer: 34 mL/min — ABNORMAL LOW (ref >60)
GFR calc non Af Amer: 29 mL/min — ABNORMAL LOW (ref >60)
GLUCOSE: 44 mg/dL — AB (ref 65–99)
POTASSIUM: 3.9 mmol/L (ref 3.5–5.1)
Sodium: 138 mmol/L (ref 135–145)

## 2016-01-28 LAB — GLUCOSE, CAPILLARY
GLUCOSE-CAPILLARY: 110 mg/dL — AB (ref 65–99)
GLUCOSE-CAPILLARY: 75 mg/dL (ref 65–99)
Glucose-Capillary: 97 mg/dL (ref 65–99)
Glucose-Capillary: 122 mg/dL — ABNORMAL HIGH (ref 65–99)
Glucose-Capillary: 110 mg/dL — ABNORMAL HIGH (ref 65–99)

## 2016-01-28 LAB — HEMOGLOBIN A1C
Hgb A1c MFr Bld: 6.5 % — ABNORMAL HIGH (ref 4.8–5.6)
MEAN PLASMA GLUCOSE: 140 mg/dL

## 2016-01-28 LAB — TROPONIN I: TROPONIN I: 0.03 ng/mL — AB (ref <0.03)

## 2016-01-28 MED ORDER — CARVEDILOL 3.125 MG PO TABS: 3.1250 mg | ORAL_TABLET | Freq: Two times a day (BID) | ORAL | Status: DC

## 2016-01-28 MED ORDER — HALOPERIDOL LACTATE 5 MG/ML IJ SOLN
5.0000 mg | Freq: Four times a day (QID) | INTRAMUSCULAR | Status: DC | PRN
  Administered 2016-01-29: 5 mg via INTRAVENOUS
  Filled 2016-01-28: qty 1

## 2016-01-28 MED ORDER — LEVOTHYROXINE SODIUM 25 MCG PO TABS
25.0000 ug | ORAL_TABLET | Freq: Every day | ORAL | Status: DC
  Administered 2016-01-29 – 2016-02-05 (×7): 25 ug via ORAL
  Filled 2016-01-28 (×9): qty 1

## 2016-01-28 MED ORDER — METOLAZONE 5 MG PO TABS
5.0000 mg | ORAL_TABLET | Freq: Two times a day (BID) | ORAL | Status: AC
  Administered 2016-01-28 – 2016-01-29 (×3): 5 mg via ORAL
  Filled 2016-01-28 (×3): qty 1

## 2016-01-28 MED ORDER — CARVEDILOL 3.125 MG PO TABS
3.1250 mg | ORAL_TABLET | Freq: Two times a day (BID) | ORAL | Status: DC
  Administered 2016-01-28 – 2016-02-05 (×15): 3.125 mg via ORAL
  Filled 2016-01-28 (×18): qty 1

## 2016-01-28 NOTE — Progress Notes (Signed)
Blood glucose now up to 97.  Will continue to monitor.

## 2016-01-28 NOTE — Progress Notes (Signed)
CRITICAL VALUE ALERT  Critical value received:  Glucose 44  Date of notification:  01/28/2016  Time of notification:  0500  Critical value read back:no  Nurse who received alert:  Herma ArdMIchele Khali Albanese RN  MD notified (1st page):   Time of first page:    MD notified (2nd page):  Time of second page:  Responding MD:    Time MD responded:

## 2016-01-28 NOTE — Evaluation (Signed)
Occupational Therapy Evaluation Patient Details Name: Sue Roach MRN: [id number] DOB: Nov 18, 1940 Today's Date: 01/28/2016    History of Present Illness Pt adm with Acute respiratory failure with hypoxia likely due to  Acute on chronic diastolic CHF exacerbation with undiagnosed underlying COPD. PMH - chf,  DM, HTN   Clinical Impression   Pt admitted with above. She demonstrates the below listed deficits and will benefit from continued OT to maximize safety and independence with BADLs.  Pt presents to OT with generalized weakness, impaired cognition, pain Lt LE, decreased activity tolerance.  She is very irritable and requires max encouragement for limited participation.  She requires max A for ADLs, and +2 assist for bed mobility.  She was unable to achieve standing position with +2 max A.  She was living alone PTA, and will not be able to return to home at that level at discharge.  Recommend SNF level rehab (pt likely to resist)      Follow Up Recommendations  SNF    Equipment Recommendations  None recommended by OT    Recommendations for Other Services       Precautions / Restrictions Precautions Precautions: Fall Restrictions Weight Bearing Restrictions: No      Mobility Bed Mobility Overal bed mobility: Needs Assistance Bed Mobility: Supine to Sit;Sit to Supine     Supine to sit: +2 for physical assistance;Max assist Sit to supine: +2 for physical assistance;Mod assist   General bed mobility comments: Assist to move legs off bed and to elevate trunk into sitting. Assist to bring legs back into bed when returning to supine.  Transfers                 General transfer comment: Attempted to stand from EOB with +2 assist. Pt unable to bring hips up from bed. C/O lt foot pain. Returned pt to supine for placement of foley.    Balance Overall balance assessment: Needs assistance Sitting-balance support: No upper extremity supported;Feet  supported Sitting balance-Leahy Scale: Fair                                      ADL Overall ADL's : Needs assistance/impaired Eating/Feeding: Independent   Grooming: Wash/dry face;Wash/dry hands;Oral care;Brushing hair;Set up;Sitting   Upper Body Bathing: Maximal assistance;Sitting;Bed level   Lower Body Bathing: Total assistance;Bed level   Upper Body Dressing : Maximal assistance;Sitting   Lower Body Dressing: Total assistance;Bed level   Toilet Transfer: Total assistance Toilet Transfer Details (indicate cue type and reason): unable  Toileting- Clothing Manipulation and Hygiene: Total assistance;Bed level Toileting - Clothing Manipulation Details (indicate cue type and reason): pt incontinent of urine, making no attempt to contact staff for assistance with bedpan or peri care      Functional mobility during ADLs: Maximal assistance;+2 for physical assistance (bed mobility) General ADL Comments: Pt very irritable, and would frequently refuse to answer questions or engage in activity when asked.      Vision     Perception     Praxis      Pertinent Vitals/Pain Pain Assessment: Faces Faces Pain Scale: Hurts even more Pain Location: lt foot Pain Descriptors / Indicators: Grimacing Pain Intervention(s): Limited activity within patient's tolerance     Hand Dominance     Extremity/Trunk Assessment Upper Extremity Assessment Upper Extremity Assessment: Generalized weakness   Lower Extremity Assessment Lower Extremity Assessment: Defer to PT evaluation   Cervical /  Trunk Assessment Cervical / Trunk Assessment: Normal (pt morbidly obese )   Communication Communication Communication: No difficulties   Cognition Arousal/Alertness: Awake/alert Behavior During Therapy:  (Very irritable) Overall Cognitive Status: No family/caregiver present to determine baseline cognitive functioning Area of Impairment: Memory;Orientation;Safety/judgement;Problem  solving Orientation Level: Disoriented to;Place   Memory: Decreased short-term memory   Safety/Judgement: Decreased awareness of deficits;Decreased awareness of safety   Problem Solving: Slow processing;Decreased initiation;Requires verbal cues;Difficulty sequencing;Requires tactile cues General Comments: Pt very irritable and uncooperative for much of session.    General Comments       Exercises       Shoulder Instructions      Home Living Family/patient expects to be discharged to:: Private residence Living Arrangements: Alone   Type of Home: House Home Access: Level entry     Home Layout: Two level;Bed/bath upstairs;1/2 bath on main level Alternate Level Stairs-Number of Steps: flight Alternate Level Stairs-Rails: Right Bathroom Shower/Tub: Tub/shower unit;Walk-in shower   Bathroom Toilet: Standard     Home Equipment: Environmental consultantWalker - 2 wheels;Cane - single point;Bedside commode   Additional Comments: Pt refused to answer most questions. Information gathered from old [facility] in April      Prior Functioning/Environment          Comments: Uses walker but pt unwilling to discuss details with us        OT Problem List: Decreased strength;Decreased activity tolerance;Impaired balance (sitting and/or standing);Decreased cognition;Decreased safety awareness;Decreased knowledge of use of DME or AE;Obesity;Pain   OT Treatment/Interventions: Self-care/ADL training;Therapeutic exercise;Energy conservation;DME and/or AE instruction;Therapeutic activities;Cognitive remediation/compensation;Patient/family education;Balance training    OT Goals(Current goals can be found in the care plan section) Acute Rehab OT Goals Patient Stated Goal: Not stated OT Goal Formulation: With patient Time For Goal Achievement: 02/11/16 Potential to Achieve Goals: Fair ADL Goals Pt Will Perform Grooming: with set-up;sitting Pt Will Perform Upper Body Bathing: with  supervision;with set-up;sitting Pt Will Perform Lower Body Bathing: sit to/from stand;with mod assist Pt Will Perform Upper Body Dressing: with set-up;sitting Pt Will Transfer to Toilet: with mod assist;stand pivot transfer;bedside commode Pt Will Perform Toileting - Clothing Manipulation and hygiene: with mod assist;sit to/from stand  OT Frequency: Min 2X/week   Barriers to D/C: Decreased caregiver support          Co-evaluation PT/OT/SLP Co-Evaluation/Treatment: Yes Reason for Co-Treatment: Complexity of the patient's impairments (multi-system involvement) PT goals addressed during session: Mobility/safety with mobility OT goals addressed during session: ADL's and self-care;Strengthening/ROM      End of Session Equipment Utilized During Treatment: Oxygen Nurse Communication: Mobility status  Activity Tolerance: Patient limited by pain;Other (comment) (cognitive and behavioral deficits ) Patient left: in bed;with call bell/phone within reach;with nursing/sitter in room   Time: 1449-1505 OT Time Calculation (min): 16 min Charges:  OT General Charges $OT Visit: 1 Procedure OT Evaluation $OT Eval Moderate Complexity: 1 Procedure G-Codes:    Sue Roach, Sue Roach 01/28/2016, 6:36 PM

## 2016-01-28 NOTE — Evaluation (Signed)
Physical Therapy Evaluation Patient Details Name: Sue Roach MRN: [id number] DOB: 11/17/1940 Today's Date: 01/28/2016   History of Present Illness  Pt adm with Acute respiratory failure with hypoxia likely due to  Acute on chronic diastolic CHF exacerbation with undiagnosed underlying COPD. PMH - chf,  DM, HTN  Clinical Impression  Pt admitted with above diagnosis and presents to PT with functional limitations due to deficits listed below (See PT problem list). Pt needs skilled PT to maximize independence and safety to allow discharge to SNF. Pt with significant cognitive and mobility issues. Pt currently self limiting and needed max encouragement for any participation.     Follow Up Recommendations SNF    Equipment Recommendations  None recommended by PT    Recommendations for Other Services       Precautions / Restrictions Precautions Precautions: Fall Restrictions Weight Bearing Restrictions: No      Mobility  Bed Mobility Overal bed mobility: Needs Assistance Bed Mobility: Supine to Sit;Sit to Supine     Supine to sit: +2 for physical assistance;Max assist Sit to supine: +2 for physical assistance;Mod assist   General bed mobility comments: Assist to move legs off bed and to elevate trunk into sitting. Assist to bring legs back into bed when returning to supine.  Transfers                 General transfer comment: Attempted to stand from EOB with +2 assist. Pt unable to bring hips up from bed. C/O lt foot pain. Returned pt to supine for placement of foley.  Ambulation/Gait                Stairs            Wheelchair Mobility    Modified Rankin (Stroke Patients Only)       Balance Overall balance assessment: Needs assistance Sitting-balance support: No upper extremity supported;Feet supported Sitting balance-Leahy Scale: Fair                                       Pertinent Vitals/Pain Pain Assessment:  Faces Faces Pain Scale: Hurts even more Pain Location: lt foot Pain Descriptors / Indicators: Grimacing Pain Intervention(s): Limited activity within patient's tolerance;Monitored during session    Home Living Family/patient expects to be discharged to:: Private residence Living Arrangements: Alone   Type of Home: House Home Access: Level entry     Home Layout: Two level;Bed/bath upstairs;1/2 bath on main level Home Equipment: Walker - 2 wheels;Cane - single point;Bedside commode Additional Comments: Pt refused to answer most questions. Information gathered from old [facility] in April    Prior Function           Comments: Uses walker but pt unwilling to discuss details with us     Hand Dominance        Extremity/Trunk Assessment   Upper Extremity Assessment: Defer to OT evaluation           Lower Extremity Assessment: Generalized weakness         Communication   Communication: No difficulties  Cognition Arousal/Alertness: Awake/alert Behavior During Therapy:  (Very irritable) Overall Cognitive Status: No family/caregiver present to determine baseline cognitive functioning Area of Impairment: Memory;Orientation;Safety/judgement;Problem solving Orientation Level: Disoriented to;Place   Memory: Decreased short-term memory   Safety/Judgement: Decreased awareness of deficits;Decreased awareness of safety   Problem Solving: Slow processing;Decreased initiation;Requires verbal cues;Difficulty sequencing;Requires  tactile cues General Comments: Pt very irritable and uncooperative for much of session.     General Comments      Exercises     Assessment/Plan    PT Assessment Patient needs continued PT services  PT Problem List Decreased strength;Decreased activity tolerance;Decreased balance;Decreased mobility;Decreased cognition;Decreased safety awareness;Obesity;Pain          PT Treatment Interventions DME instruction;Gait  training;Functional mobility training;Therapeutic activities;Therapeutic exercise;Balance training;Patient/family education    PT Goals (Current goals can be found in the Care Plan section)  Acute Rehab PT Goals Patient Stated Goal: Not stated PT Goal Formulation: With patient Time For Goal Achievement: 02/11/16 Potential to Achieve Goals: Fair    Frequency Min 3X/week   Barriers to discharge Inaccessible home environment;Decreased caregiver support two story home and lives alone    Co-evaluation PT/OT/SLP Co-Evaluation/Treatment: Yes Reason for Co-Treatment: Necessary to address cognition/behavior during functional activity;For patient/therapist safety PT goals addressed during session: Mobility/safety with mobility         End of Session Equipment Utilized During Treatment: Oxygen Activity Tolerance: Other (comment) (Self limiting) Patient left: in bed;with call bell/phone within reach;with nursing/sitter in room Nurse Communication: Mobility status         Time: [id number] PT Time Calculation (min) (ACUTE ONLY): 17 min   Charges:         PT G CodesAngelina Ok:        Sibel Khurana W Maycok 01/28/2016, 3:37 PM  [employer]or CorporationCary Johndavid Geralds PT [phone]

## 2016-01-28 NOTE — Progress Notes (Signed)
PROGRESS NOTE    Sue Roach  ZOX:[id number] DOB: [birth year] DOA: 01/27/2016 PCP: Kaleen Mask, MD   Brief Narrative:  75 y.o. WF PMHx Chronic Diastolic CHF, DM type II controlled with complication, HTN  With prior hospitalization in April 2017 with similar symptoms as of today, presenting with acute episode of shortness of breath.  The patient is not on oxygen at home. When  EMS arrived, she was slumped over in the chair, minimally responsive. Her O2 sats were 61% in room air. EMS applied 4 L with improvement to 83%. Her level of consciousness at the time improved, so she was able to get out of her chair, and walked to the front door. However, she was increasingly fatigued, and was started on CPAP, with immediate improvement of symptoms. The patient had been seen by her PCP 2 days prior, at which time the PCP did not deem necessary to change any of her CHF meds. The patient was taking Lasix twice a day, sometimes 3. However, her symptoms appear to worsen . Her O2 sats with BiPAP, improved to 94%, but she was unable to be weaned off. The patient according to her daughter has had a 10 to 12 pound weight gain, with some decreased urine output recently. She also has noted increase in lower extremity edema, in addition to impetigo of the left leg, currently dressed in bandages. Moreover, she has lower extremity edema occur right as well. According to her daughter, the patient cannot walk more than 20 steps without becoming short of breath. She is unable to walk upstairs to her bedroom. No apartment chest pain or palpitations. She did CAD intermittent nausea without vomiting, no diarrhea or abdominal pain.  No fever, chills, night sweats or bleeding issues reported . She is being admitted for further management of her symptoms, and further evaluation.   Subjective: 12/7 A/O 2 (does not know where, when). States does not use home O2 (daughter states is supposed to be on home O2). States  ambulates with walker with in-house. Unknown base weight, does not weigh self daily.     Assessment & Plan:   Active Problems:   CHF (congestive heart failure) (HCC)   Acute respiratory failure with hypoxia (HCC)   Chronic kidney disease   Diabetes mellitus, type II (HCC)   Leg wound, left   Anemia of chronic disease   Tobacco abuse   Diabetes mellitus with complication (HCC)   Acute respiratory failure with hypoxia  -Most likely multifactorial likely, Acute on chronic diastolic CHF,COPD, OSA/OHS .  -Troponins mildly elevated demand ischemia most likely cause. -  Will need CHF OP follow up  -OP PFT to monitor for COPD    COPD exacerbation  -Secondary to noncompliance -DuoNeb QID   OSA/OHS -BiPAP per respiratory QHS/PRN -Titrate O2 to maintain SPO2 89 and 93%  Tobacco abuse -Per daughter smoked 1 pack per day prior to admission  Chronic Systolic CHF/cardiomyopathy( Weight  129.3 kg (per daughter,10-12 lbs more than her BL) -Strict in and out since admission +76ml -Daily weight Filed Weights   01/27/16 0845 01/27/16 1200 01/28/16 0300  Weight: 129.3 kg (285 lb) 129 kg (284 lb 4.8 oz) 129.4 kg (285 lb 4.4 oz)  -Coreg 3.125 mg BID -Lasix 120 mg BID -Zaroxolyn 5 mg 3 doses -Hold on starting ACEI/ARB secondary to renal function  Pulmonary hypertension -See CHF  HTN -See CHF  Type II Diabetes  -Hemoglobin A1c= 6.5 -Sensitive SSI   Acute on Chronic kidney disease stage 3(baseline Cr1.5-1.6)  Lab Results  Component Value Date   CREATININE 1.64 (H) 01/28/2016   CREATININE 1.71 (H) 01/27/2016    Anemia of chronic disease Hemoglobin on admission 9, at baseline   -No bleeding issues reported  -Start Iron supplement   Tobacco abuse   -Per daughter 1 PPD   -  Nicotine patch  prn  Hyperthyroidism -TSH: Elevated 6.8 (hyperthyroidism)  -Synthroid 25 g daily   LLE  And R sacral wound.    -Wearing una boot  on her LLE  Keflex   Wound care consult for  replacement of  UNA boot prior to her discharge and for the care of her R sacral wound   Agitation -PRN Haldol  Deconditioning. OT/PT consult pending  Social:  -patient lives alone, does not appear to be competent. Psychiatry consult placed, IVC? -At a minimum patient will require SNF at discharge.     DVT prophylaxis: Subcutaneous heparin Code Status: DO NOT RESUSCITATE Family Communication: None Disposition Plan: IVC vs SNF   Consultants:  Psychiatry pending  Procedures/Significant Events:  12/6 Echocardiogram:Left ventricle: moderate LVH. -LVEF =50% to 55%. -Right ventricle: moderately dilated. - Right atrium: mildly to moderately dilated. - Pulmonary arteries: PA   peak pressure: 36 mm Hg (S).   VENTILATOR SETTINGS: NA   Cultures NA  Antimicrobials: NA  Devices    LINES / TUBES:  NA    Continuous Infusions:   Objective: Vitals:   01/28/16 0300 01/28/16 0700 01/28/16 0800 01/28/16 1100  BP: (!) 148/56 (!) 140/56 (!) 140/56 (!) 128/52  Pulse: 71 72 73 86  Resp: (!) 23 (!) 23 19 (!) 21  Temp: 97.9 F (36.6 C) 97.8 F (36.6 C)    TempSrc: Axillary Oral    SpO2: 92% 94% 92% 93%  Weight: 129.4 kg (285 lb 4.4 oz)     Height:        Intake/Output Summary (Last 24 hours) at 01/28/16 1330 Last data filed at 01/27/16 1825  Gross per 24 hour  Intake               62 ml  Output                0 ml  Net               62 ml   Filed Weights   01/27/16 0845 01/27/16 1200 01/28/16 0300  Weight: 129.3 kg (285 lb) 129 kg (284 lb 4.8 oz) 129.4 kg (285 lb 4.4 oz)    Examination:  General: A/O 2 (does not know where, when)., positive acute on chronic Respiratory distress (requires home O2 but REFUSES to use per daughter) Eyes: negative scleral hemorrhage, negative anisocoria, negative icterus ENT: Negative Runny nose, negative gingival bleeding, Neck:  Negative scars, masses, torticollis, lymphadenopathy, JVD Lungs: poor air movement diffusely,  positive mild expiratory  wheezes  Cardiovascular: Regular rate and rhythm without murmur gallop or rub normal S1 and S2 Abdomen: MORBIDLY OBESE, negative abdominal pain, nondistended, positive soft, bowel sounds, no rebound, no ascites, no appreciable mass, anasarca Extremities: LLE Unna boot, RLE closely edematous.  Skin: Negative rashes, lesions, ulcers Psychiatric:  Not competent. Patient with no understanding of her medical condition. Central nervous system:  Cranial nerves II through XII intact, tongue/uvula midline, all extremities muscle strength 5/5, sensation intact throughout, negative dysarthria, negative expressive aphasia, negative receptive aphasia.  .     Data Reviewed: Care during the described time interval was provided by me .  I have reviewed this  patient's available data, including medical history, events of note, physical examination, and all test results as part of my evaluation. I have personally reviewed and interpreted all radiology studies.  CBC:  Recent Labs Lab 01/27/16 0858  WBC 9.1  NEUTROABS 6.9  HGB 9.4*  HCT 31.5*  MCV 82.9  PLT 399   Basic Metabolic Panel:  Recent Labs Lab 01/27/16 0858 01/28/16 0359  NA 136 138  K 3.8 3.9  CL 98* 100*  CO2 31 28  GLUCOSE 111* 44*  BUN 43* 43*  CREATININE 1.71* 1.64*  CALCIUM 8.4* 8.2*   GFR: Estimated Creatinine Clearance: 36.4 mL/min (by C-G formula based on SCr of 1.64 mg/dL (H)). Liver Function Tests: No results for input(s): AST, ALT, ALKPHOS, BILITOT, PROT, ALBUMIN in the last 168 hours. No results for input(s): LIPASE, AMYLASE in the last 168 hours. No results for input(s): AMMONIA in the last 168 hours. Coagulation Profile: No results for input(s): INR, PROTIME in the last 168 hours. Cardiac Enzymes:  Recent Labs Lab 01/27/16 1130 01/27/16 1630 01/27/16 2242  TROPONINI 0.04* 0.04* 0.03*   BNP (last 3 results) No results for input(s): PROBNP in the last 8760 hours. HbA1C:  Recent  Labs  01/27/16 1130  HGBA1C 6.5*   CBG:  Recent Labs Lab 01/27/16 1727 01/27/16 2058 01/28/16 0524 01/28/16 0821 01/28/16 1131  GLUCAP 99 67 97 122* 110*   Lipid Profile: No results for input(s): CHOL, HDL, LDLCALC, TRIG, CHOLHDL, LDLDIRECT in the last 72 hours. Thyroid Function Tests:  Recent Labs  01/27/16 1130  TSH 6.845*   Anemia Panel: No results for input(s): VITAMINB12, FOLATE, FERRITIN, TIBC, IRON, RETICCTPCT in the last 72 hours. Urine analysis: No results found for: COLORURINE, APPEARANCEUR, LABSPEC, PHURINE, GLUCOSEU, HGBUR, BILIRUBINUR, KETONESUR, PROTEINUR, UROBILINOGEN, NITRITE, LEUKOCYTESUR Sepsis Labs: @LABRCNTIP (procalcitonin:4,lacticidven:4)  ) Recent Results (from the past 240 hour(s))  MRSA PCR Screening     Status: None   Collection Time: 01/27/16 11:52 AM  Result Value Ref Range Status   MRSA by PCR NEGATIVE NEGATIVE Final    Comment:        The GeneXpert MRSA Assay (FDA approved for NASAL specimens only), is one component of a comprehensive MRSA colonization surveillance program. It is not intended to diagnose MRSA infection nor to guide or monitor treatment for MRSA infections.          Radiology Studies: Dg Chest 2 View  Result Date: 01/28/2016 CLINICAL DATA:  CHF exacerbation EXAM: CHEST  2 VIEW COMPARISON:  Portable chest x-ray of 01/27/2016 FINDINGS: The probable CHF previously has diminished and aeration has improved. Moderate cardiomegaly remains. There may be very minimal pulmonary vascular congestion remaining. No effusion is seen. No bony abnormality is noted. IMPRESSION: Improvement in the edema pattern with perhaps mild residual pulmonary vascular congestion. Stable cardiomegaly Electronically Signed   By: Dwyane Dee M.D.   On: 01/28/2016 09:51   Dg Chest Port 1 View  Result Date: 01/27/2016 CLINICAL DATA:  Short of breath, low oxygen saturation, smoking history EXAM: PORTABLE CHEST 1 VIEW COMPARISON:  Chest x-ray of  01/24/2014 FINDINGS: The lungs are poorly aerated. There is underpenetration of the lung bases, and the left lung base in particular cannot be well evaluated. Pneumonia or atelectasis cannot be excluded at the left lung base. There is cardiomegaly present with mild pulmonary vascular congestion. No bony abnormality is seen. IMPRESSION: Poor inspiration film with poor penetration. Probable mild CHF with cardiomegaly and pulmonary vascular congestion. Cannot evaluate the left lung base. Electronically  Signed   By: Dwyane DeePaul  Barry M.D.   On: 01/27/2016 09:12        Scheduled Meds: . amLODipine  10 mg Oral Daily  . aspirin EC  81 mg Oral Daily  . cephALEXin  500 mg Oral Q12H  . chlorhexidine  15 mL Mouth Rinse BID  . furosemide  120 mg Intravenous BID  . heparin  5,000 Units Subcutaneous Q8H  . hydrALAZINE  25 mg Oral TID  . insulin aspart  0-9 Units Subcutaneous TID WC  . mouth rinse  15 mL Mouth Rinse q12n4p  . potassium chloride  40 mEq Oral BID  . sodium chloride flush  3 mL Intravenous Q12H   Continuous Infusions:   LOS: 1 day    Time spent: 40 minutes    [place], Roselind MessierURTIS J, MD [facility] Pager [phone]   If 7PM-7AM, please contact night-coverage [email or web address] Password [email or web address] 01/28/2016, 1:30 PM

## 2016-01-28 NOTE — Progress Notes (Signed)
Placed patient on BiPAP for the night without complications. RT will continue to monitor throughout use.

## 2016-01-29 DIAGNOSIS — F17210 Nicotine dependence, cigarettes, uncomplicated: Secondary | ICD-10-CM

## 2016-01-29 DIAGNOSIS — F4320 Adjustment disorder, unspecified: Secondary | ICD-10-CM

## 2016-01-29 DIAGNOSIS — D638 Anemia in other chronic diseases classified elsewhere: Secondary | ICD-10-CM

## 2016-01-29 DIAGNOSIS — Z79899 Other long term (current) drug therapy: Secondary | ICD-10-CM

## 2016-01-29 DIAGNOSIS — Z882 Allergy status to sulfonamides status: Secondary | ICD-10-CM

## 2016-01-29 LAB — BASIC METABOLIC PANEL
Anion gap: 11 (ref 5–15)
BUN: 43 mg/dL — AB (ref 6–20)
CO2: 28 mmol/L (ref 22–32)
CREATININE: 1.71 mg/dL — AB (ref 0.44–1.00)
Calcium: 8.1 mg/dL — ABNORMAL LOW (ref 8.9–10.3)
Chloride: 97 mmol/L — ABNORMAL LOW (ref 101–111)
GFR, EST AFRICAN AMERICAN: 32 mL/min — AB (ref >60)
GFR, EST NON AFRICAN AMERICAN: 28 mL/min — AB (ref >60)
Glucose, Bld: 125 mg/dL — ABNORMAL HIGH (ref 65–99)
POTASSIUM: 3.7 mmol/L (ref 3.5–5.1)
SODIUM: 136 mmol/L (ref 135–145)

## 2016-01-29 LAB — URINALYSIS, ROUTINE W REFLEX MICROSCOPIC
BILIRUBIN URINE: NEGATIVE
Glucose, UA: NEGATIVE mg/dL
Ketones, ur: NEGATIVE mg/dL
NITRITE: NEGATIVE
Protein, ur: 100 mg/dL — AB
Specific Gravity, Urine: 1.009 (ref 1.005–1.030)
pH: 5 (ref 5.0–8.0)

## 2016-01-29 LAB — MAGNESIUM: MAGNESIUM: 2.6 mg/dL — AB (ref 1.7–2.4)

## 2016-01-29 LAB — GLUCOSE, CAPILLARY
GLUCOSE-CAPILLARY: 177 mg/dL — AB (ref 65–99)
GLUCOSE-CAPILLARY: 170 mg/dL — AB (ref 65–99)
Glucose-Capillary: 124 mg/dL — ABNORMAL HIGH (ref 65–99)
Glucose-Capillary: 126 mg/dL — ABNORMAL HIGH (ref 65–99)
Glucose-Capillary: 97 mg/dL (ref 65–99)

## 2016-01-29 MED ORDER — ACETAMINOPHEN 325 MG PO TABS: 650.0000 mg | ORAL_TABLET | ORAL | Status: DC | PRN

## 2016-01-29 MED ORDER — FUROSEMIDE 10 MG/ML IJ SOLN
80.0000 mg | Freq: Three times a day (TID) | INTRAMUSCULAR | Status: DC
  Administered 2016-01-29 – 2016-01-30 (×3): 80 mg via INTRAVENOUS
  Filled 2016-01-29 (×3): qty 8

## 2016-01-29 NOTE — Progress Notes (Signed)
Patient refusing BiPAP, not placed back on at this time.

## 2016-01-29 NOTE — Progress Notes (Signed)
Patients CBG at bedtime was 75. Gave patient a 4 oz orange juice and 1/2 an Ensure for some protein. Had to take patient off of the bipap to administer and will wait 30 minutes after, before calling RT to put her back on. Will reassess CBG within 30 minutes of her completing supplements.

## 2016-01-29 NOTE — Progress Notes (Signed)
After completing her snack, patient refused to go back on Bipap. Stated she was afraid someone would come in and re-wrap her lower legs in an Radio broadcast assistantUnna boot. Nurse and RT explained to patient that her oxygen saturations were less than 885 and it was crucial. Patient responded by saying that [facility] discharged her with her sats that low and she's fine. Stated she wanted to go home and that we were liars. Patient was aggressive with staff. Haldol given IV as ordered but was unsuccessful. On call [facility] provider notified and said to turn her 02 up to 6L via n/c and as long as her sats were greater than 88% it was okay. Charge nurse notified and came in and could not reason with patient either. Will continue to monitor closely.

## 2016-01-29 NOTE — Consult Note (Signed)
[facility] Face-to-Face Psychiatry Consult   Reason for Consult:  Competency Referring Physician:  Dr. Sherral Hammers Patient Identification: Sue Roach MRN:  [id number] Principal Diagnosis: <principal problem not specified> Diagnosis:   Patient Active Problem List   Diagnosis Date Noted  . COPD exacerbation ([place]) [J44.1]   . Obstructive sleep apnea [G47.33]   . Obesity hypoventilation syndrome ([place]) [E66.2]   . Chronic systolic CHF (congestive heart failure) ([place]) [I50.22]   . Dilated cardiomyopathy ([place]) [I42.0]   . Pulmonary hypertension [I27.20]   . Controlled diabetes mellitus type 2 with complications ([place]) [E83.1]   . Acute renal failure with acute tubular necrosis superimposed on stage 2 chronic kidney disease ([place]) [N17.0, N18.2]   . CHF (congestive heart failure) ([place]) [I50.9] 01/27/2016  . Acute respiratory failure with hypoxia ([place]) [J96.01] 01/27/2016  . Chronic kidney disease [N18.9] 01/27/2016  . Diabetes mellitus, type II ([place]) [E11.9] 01/27/2016  . Leg wound, left [S81.802A] 01/27/2016  . Anemia of chronic disease [D63.8] 01/27/2016  . Tobacco abuse [Z72.0] 01/27/2016  . Diabetes mellitus with complication ([place]) [D17.6]   . Impetigo [L01.00] 05/29/2015  . Morbid obesity due to excess calories ([place]) [E66.01] 05/29/2015  . Acute on chronic diastolic congestive heart failure (HCC) [I50.33] 05/29/2015    Total Time spent with patient: 1 hour  Subjective:   Sue Roach is a 75 y.o. female patient admitted with acute respiratory failure with hypoxia.  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 Patient seen, chart reviewed and case discussed with staff RN and patient daughter who arrived to the patient bedside during my evaluation. Patient provided  consent to discuss with her regarding her case. Patient is awake, alert, oriented 3 and pleasant during this evaluation. Patient is on continuous oxygen supply and cardiac monitoring during this evaluation. Patient reported she was admitted to the hospital because her daughter contacted emergency medical services because of her shortness of breath. Patient reported she was recently admitted to [facility] for the same problem. Patient also endorses smoking tobacco 1 pack for day because she likes to smoke. Patient reported she is willing to quit because it's making her more sick and needed frequent hospitalization. Patient lives by herself and reportedly has help at home and also her daughter and son will visit her frequently and help her for shopping. Patient reported she walks with a walker at home and also has been paying her bills online banking. Patient reportedly fix her own medication and has a pill organizer at home. Patient cognitions are intact except mild confusion for being in hospital and being sick. For example patient stated she is in [employer] and [employer] She also slightly off on her days and date of the day. She has taken excessive amount of time for supper reactions of 7 or 3 serious but able to do well on spelling word "WORLD" even she took second attempt. She has fund of knowledge under appropriate language functions according to her age. Patient reportedly has driving privileges but decided not to drive because of her health problems. Patient likes to have her own independence and preferred to live at her own home when given options of staying skilled nursing facility with rehabilitation services.  Past Psychiatric History: None reported.  Risk to Self: Is patient at risk for suicide?: No Risk  to Others:   Prior Inpatient Therapy:   Prior Outpatient Therapy:    Past Medical History:  Past Medical History:  Diagnosis Date  . CHF (congestive heart  failure) ([place])   . Diabetes mellitus without complication ([place])   . Hypertension   . Renal disorder    History reviewed. No pertinent surgical history. Family History: History reviewed. No pertinent family history. Family Psychiatric  History: No family history of psychiatric illness. Social History:  History  Alcohol Use No     History  Drug Use No    Social History   Social History  . Marital status: Widowed    Spouse name: N/A  . Number of children: N/A  . Years of education: N/A   Social History Main Topics  . Smoking status: Current Every Day Smoker    Packs/day: 1.00    Types: Cigarettes  . Smokeless tobacco: Never Used  . Alcohol use No  . Drug use: No  . Sexual activity: Not Asked   Other Topics Concern  . None   Social History Narrative  . None   Additional Social History:    Allergies:   Allergies  Allergen Reactions  . Sulfa Antibiotics     Labs:  Results for orders placed or performed during the hospital encounter of 01/27/16 (from the past 48 hour(s))  I-Stat arterial blood gas, ED     Status: Abnormal   Collection Time: 01/27/16  9:24 AM  Result Value Ref Range   pH, Arterial 7.361 7.350 - 7.450   pCO2 arterial 50.3 (H) 32.0 - 48.0 mmHg   pO2, Arterial 66.0 (L) 83.0 - 108.0 mmHg   Bicarbonate 28.5 (H) 20.0 - 28.0 mmol/L   TCO2 30 0 - 100 mmol/L   O2 Saturation 92.0 %   Acid-Base Excess 2.0 0.0 - 2.0 mmol/L   Patient temperature 98.4 F    Collection site RADIAL, ALLEN'S TEST ACCEPTABLE    Drawn by RT    Sample type ARTERIAL   Hemoglobin A1c     Status: Abnormal   Collection Time: 01/27/16 11:30 AM  Result Value Ref Range   Hgb A1c MFr Bld 6.5 (H) 4.8 - 5.6 %    Comment: (NOTE)         Pre-diabetes: 5.7 - 6.4         Diabetes: >6.4         Glycemic control for adults with diabetes: <7.0    Mean Plasma Glucose 140 mg/dL    Comment: (NOTE) Performed At: [facility] [place], [place] [id number] Lindon Romp  MD CB:[id number]   TSH     Status: Abnormal   Collection Time: 01/27/16 11:30 AM  Result Value Ref Range   TSH 6.845 (H) 0.350 - 4.500 uIU/mL    Comment: Performed by a 3rd Generation assay with a functional sensitivity of <=0.01 uIU/mL.  Troponin I     Status: Abnormal   Collection Time: 01/27/16 11:30 AM  Result Value Ref Range   Troponin I 0.04 (HH) <0.03 ng/mL    Comment: CRITICAL RESULT CALLED TO, READ BACK BY AND VERIFIED WITH: ATEPE,A RN @ 1305 01/27/16 LEONARA,D   Lactic acid, plasma     Status: None   Collection Time: 01/27/16 11:30 AM  Result Value Ref Range   Lactic Acid, Venous 1.0 0.5 - 1.9 mmol/L  Prealbumin     Status: None   Collection Time: 01/27/16 11:30 AM  Result Value Ref Range   Prealbumin  21.3 18 - 38 mg/dL  MRSA PCR Screening     Status: None   Collection Time: 01/27/16 11:52 AM  Result Value Ref Range   MRSA by PCR NEGATIVE NEGATIVE    Comment:        The GeneXpert MRSA Assay (FDA approved for NASAL specimens only), is one component of a comprehensive MRSA colonization surveillance program. It is not intended to diagnose MRSA infection nor to guide or monitor treatment for MRSA infections.   Glucose, capillary     Status: None   Collection Time: 01/27/16 12:39 PM  Result Value Ref Range   Glucose-Capillary 90 65 - 99 mg/dL  Lactic acid, plasma     Status: None   Collection Time: 01/27/16  3:09 PM  Result Value Ref Range   Lactic Acid, Venous 0.8 0.5 - 1.9 mmol/L  Glucose, capillary     Status: Abnormal   Collection Time: 01/27/16  3:50 PM  Result Value Ref Range   Glucose-Capillary 64 (L) 65 - 99 mg/dL  Troponin I     Status: Abnormal   Collection Time: 01/27/16  4:30 PM  Result Value Ref Range   Troponin I 0.04 (HH) <0.03 ng/mL    Comment: CRITICAL VALUE NOTED.  VALUE IS CONSISTENT WITH PREVIOUSLY REPORTED AND CALLED VALUE.  Glucose, capillary     Status: None   Collection Time: 01/27/16  5:27 PM  Result Value Ref Range    Glucose-Capillary 99 65 - 99 mg/dL  I-STAT 3, arterial blood gas (G3+)     Status: Abnormal   Collection Time: 01/27/16  6:10 PM  Result Value Ref Range   pH, Arterial 7.483 (H) 7.350 - 7.450   pCO2 arterial 37.4 32.0 - 48.0 mmHg   pO2, Arterial 52.0 (L) 83.0 - 108.0 mmHg   Bicarbonate 28.0 20.0 - 28.0 mmol/L   TCO2 29 0 - 100 mmol/L   O2 Saturation 89.0 %   Acid-Base Excess 4.0 (H) 0.0 - 2.0 mmol/L   Patient temperature HIDE    Sample type ARTERIAL   Glucose, capillary     Status: None   Collection Time: 01/27/16  8:58 PM  Result Value Ref Range   Glucose-Capillary 67 65 - 99 mg/dL  Troponin I     Status: Abnormal   Collection Time: 01/27/16 10:42 PM  Result Value Ref Range   Troponin I 0.03 (HH) <0.03 ng/mL    Comment: CRITICAL VALUE NOTED.  VALUE IS CONSISTENT WITH PREVIOUSLY REPORTED AND CALLED VALUE.  Basic metabolic panel     Status: Abnormal   Collection Time: 01/28/16  3:59 AM  Result Value Ref Range   Sodium 138 135 - 145 mmol/L   Potassium 3.9 3.5 - 5.1 mmol/L   Chloride 100 (L) 101 - 111 mmol/L   CO2 28 22 - 32 mmol/L   Glucose, Bld 44 (LL) 65 - 99 mg/dL    Comment: CRITICAL RESULT CALLED TO, READ BACK BY AND VERIFIED WITH: MCMULLEN M,RN 01/28/16 0502 WAYK    BUN 43 (H) 6 - 20 mg/dL   Creatinine, Ser 1.64 (H) 0.44 - 1.00 mg/dL   Calcium 8.2 (L) 8.9 - 10.3 mg/dL   GFR calc non Af Amer 29 (L) >60 mL/min   GFR calc Af Amer 34 (L) >60 mL/min    Comment: (NOTE) The eGFR has been calculated using the CKD EPI equation. This calculation has not been validated in all clinical situations. eGFR's persistently <60 mL/min signify possible Chronic Kidney Disease.  Anion gap 10 5 - 15  Glucose, capillary     Status: None   Collection Time: 01/28/16  5:24 AM  Result Value Ref Range   Glucose-Capillary 97 65 - 99 mg/dL  Glucose, capillary     Status: Abnormal   Collection Time: 01/28/16  8:21 AM  Result Value Ref Range   Glucose-Capillary 122 (H) 65 - 99 mg/dL   Glucose, capillary     Status: Abnormal   Collection Time: 01/28/16 11:31 AM  Result Value Ref Range   Glucose-Capillary 110 (H) 65 - 99 mg/dL  Glucose, capillary     Status: Abnormal   Collection Time: 01/28/16  4:14 PM  Result Value Ref Range   Glucose-Capillary 110 (H) 65 - 99 mg/dL  Glucose, capillary     Status: None   Collection Time: 01/28/16 10:24 PM  Result Value Ref Range   Glucose-Capillary 75 65 - 99 mg/dL  Glucose, capillary     Status: Abnormal   Collection Time: 01/29/16 12:16 AM  Result Value Ref Range   Glucose-Capillary 126 (H) 65 - 99 mg/dL  Basic metabolic panel     Status: Abnormal   Collection Time: 01/29/16  1:41 AM  Result Value Ref Range   Sodium 136 135 - 145 mmol/L   Potassium 3.7 3.5 - 5.1 mmol/L   Chloride 97 (L) 101 - 111 mmol/L   CO2 28 22 - 32 mmol/L   Glucose, Bld 125 (H) 65 - 99 mg/dL   BUN 43 (H) 6 - 20 mg/dL   Creatinine, Ser 1.71 (H) 0.44 - 1.00 mg/dL   Calcium 8.1 (L) 8.9 - 10.3 mg/dL   GFR calc non Af Amer 28 (L) >60 mL/min   GFR calc Af Amer 32 (L) >60 mL/min    Comment: (NOTE) The eGFR has been calculated using the CKD EPI equation. This calculation has not been validated in all clinical situations. eGFR's persistently <60 mL/min signify possible Chronic Kidney Disease.    Anion gap 11 5 - 15  Magnesium     Status: Abnormal   Collection Time: 01/29/16  1:41 AM  Result Value Ref Range   Magnesium 2.6 (H) 1.7 - 2.4 mg/dL  Urinalysis, Routine w reflex microscopic     Status: Abnormal   Collection Time: 01/29/16  5:08 AM  Result Value Ref Range   Color, Urine YELLOW YELLOW   APPearance CLEAR CLEAR   Specific Gravity, Urine 1.009 1.005 - 1.030   pH 5.0 5.0 - 8.0   Glucose, UA NEGATIVE NEGATIVE mg/dL   Hgb urine dipstick SMALL (A) NEGATIVE   Bilirubin Urine NEGATIVE NEGATIVE   Ketones, ur NEGATIVE NEGATIVE mg/dL   Protein, ur 100 (A) NEGATIVE mg/dL   Nitrite NEGATIVE NEGATIVE   Leukocytes, UA SMALL (A) NEGATIVE   RBC / HPF  6-30 0 - 5 RBC/hpf   WBC, UA 6-30 0 - 5 WBC/hpf   Bacteria, UA RARE (A) NONE SEEN   Squamous Epithelial / LPF 0-5 (A) NONE SEEN   Hyaline Casts, UA PRESENT   Glucose, capillary     Status: None   Collection Time: 01/29/16  7:47 AM  Result Value Ref Range   Glucose-Capillary 97 65 - 99 mg/dL    Current Facility-Administered Medications  Medication Dose Route Frequency Provider Last Rate Last Dose  . 0.9 %  sodium chloride infusion  250 mL Intravenous PRN Rondel Jumbo, PA-C      . acetaminophen (TYLENOL) tablet 650 mg  650 mg Oral Q4H  PRN Rondel Jumbo, PA-C   650 mg at 01/28/16 2312  . ALPRAZolam Duanne Moron) tablet 0.25 mg  0.25 mg Oral BID PRN Rondel Jumbo, PA-C   0.25 mg at 01/28/16 2311  . aspirin EC tablet 81 mg  81 mg Oral Daily Rondel Jumbo, PA-C   81 mg at 01/28/16 1050  . carvedilol (COREG) tablet 3.125 mg  3.125 mg Oral BID WC Allie Bossier, MD   3.125 mg at 01/28/16 2312  . cephALEXin (KEFLEX) capsule 500 mg  500 mg Oral Q12H Rondel Jumbo, PA-C   500 mg at 01/28/16 2311  . chlorhexidine (PERIDEX) 0.12 % solution 15 mL  15 mL Mouth Rinse BID Larene Pickett, PA-C   15 mL at 01/28/16 2322  . docusate sodium (COLACE) capsule 100 mg  100 mg Oral BID PRN Rondel Jumbo, PA-C      . furosemide (LASIX) 120 mg in dextrose 5 % 50 mL IVPB  120 mg Intravenous BID Rondel Jumbo, PA-C   120 mg at 01/28/16 1531  . haloperidol lactate (HALDOL) injection 5 mg  5 mg Intravenous Q6H PRN Allie Bossier, MD   5 mg at 01/29/16 0047  . heparin injection 5,000 Units  5,000 Units Subcutaneous Q8H Rondel Jumbo, PA-C   5,000 Units at 01/28/16 2312  . hydrALAZINE (APRESOLINE) tablet 25 mg  25 mg Oral TID Rondel Jumbo, PA-C   25 mg at 01/28/16 2311  . insulin aspart (novoLOG) injection 0-9 Units  0-9 Units Subcutaneous TID WC Rondel Jumbo, PA-C      . levothyroxine (SYNTHROID, LEVOTHROID) tablet 25 mcg  25 mcg Oral QAC breakfast Allie Bossier, MD      . MEDLINE mouth rinse  15 mL Mouth Rinse  q12n4p Larene Pickett, PA-C   15 mL at 01/28/16 1531  . metolazone (ZAROXOLYN) tablet 5 mg  5 mg Oral BID Allie Bossier, MD   5 mg at 01/28/16 1529  . ondansetron (ZOFRAN) injection 4 mg  4 mg Intravenous Q6H PRN Rondel Jumbo, PA-C      . potassium chloride SA (K-DUR,KLOR-CON) CR tablet 40 mEq  40 mEq Oral BID Rondel Jumbo, PA-C   40 mEq at 01/28/16 2312  . sodium chloride flush (NS) 0.9 % injection 3 mL  3 mL Intravenous Q12H Rondel Jumbo, PA-C   3 mL at 01/28/16 2313  . sodium chloride flush (NS) 0.9 % injection 3 mL  3 mL Intravenous PRN Rondel Jumbo, PA-C        Musculoskeletal: Strength & Muscle Tone: decreased Gait & Station: unable to stand Patient leans: N/A  Psychiatric Specialty Exam: Physical Exam as per history and physical   ROS morbid overweight, multiple medical problems including shortness of breath and hypoxia, dry lips and frequently taking sips. No Fever-chills, No Headache, No changes with Vision or hearing, reports vertigo No problems swallowing food or Liquids, No Chest pain, Cough or Shortness of Breath, No Abdominal pain, No Nausea or Vommitting, Bowel movements are regular, No Blood in stool or Urine, No dysuria, No new skin rashes or bruises, No new joints pains-aches,  No new weakness, tingling, numbness in any extremity, No recent weight gain or loss, No polyuria, polydypsia or polyphagia,   A full 10 point Review of Systems was done, except as stated above, all other Review of Systems were negative.   Blood pressure (!) 148/53, pulse 71, temperature 97.5 F (36.4 C),  temperature source Oral, resp. rate (!) 28, height 5' (1.524 m), weight 128.6 kg (283 lb 8.2 oz), SpO2 100 %.Body mass index is 55.37 kg/m.  General Appearance: Casual  Eye Contact:  Good  Speech:  Clear and Coherent  Volume:  Decreased  Mood:  Anxious and Euthymic  Affect:  Appropriate and Congruent  Thought Process:  Coherent and Goal Directed  Orientation:  Full (Time,  Place, and Person)  Thought Content:  Logical  Suicidal Thoughts:  No  Homicidal Thoughts:  No  Memory:  Immediate;   Fair Recent;   Fair Remote;   Fair  Judgement:  Intact  Insight:  Fair  Psychomotor Activity:  Decreased  Concentration:  Concentration: Fair and Attention Span: Fair  Recall:  [employer] of Knowledge:  Good  Language:  Good  Akathisia:  Negative  Handed:  Right  AIMS (if indicated):     Assets:  Communication Skills Desire for Improvement Financial Resources/Insurance Housing Leisure Time Resilience Social Support  ADL's:  Impaired  Cognition:  WNL  Sleep:        Treatment Plan Summary: 75 years old female with multiple medical problems including congestive heart failure, acute respiratory failure with hypoxia, chronic kidney disease, diabetes mellitus type 2 and left leg swelling and also tobacco dependence admitted for shortness of breath. Patient has mild cognitive deficits but overall she has intact mental status. Based on my evaluation patient meets criteria capacity to make her own medical decisions and living arrangements. It is understood patient family concern that she may not able to get the medical help on time when she needed and she may not able to ask for help and nobody around her as she lives by herself and has a lot of physical disabilities .  Patient daughter was advised to make sure family members have medical care power of attorney to make appropriate medical decisions and living arrangements when she becomes incapacitated in near future.   Reportedly patient son may have already medical care power of attorney as suggested.  Appreciate psychiatric consultation and we sign off as of today Please contact [phone] or [phone] if needs further assistance   Disposition: Patient benefit from skilled nursing facility with rehabilitation secondary to multiple physical disabilities and also has a limited support at home.  Patient at least  benefit from home health care services if she is not chosen to go to skilled nursing facility during this hospitalization.  Supportive therapy provided about ongoing stressors.  Ambrose Finland, MD 01/29/2016 9:17 AM

## 2016-01-29 NOTE — Progress Notes (Addendum)
[place] TEAM 1 - Stepdown/ICU TEAM  Sue Roach  ZOX:[id number] DOB: 1940-09-12 DOA: 01/27/2016 PCP: Kaleen MaskELKINS,WILSON OLIVER, MD    Brief Narrative:  75 y.o.F Hx Chronic Diastolic CHF, DM2, and HTN who presented with acute shortness of breath. When EMS arrived she was slumped over in a chair, minimally responsive. Her O2 sats were 61% on RA. EMS applied 4 L with improvement to 83%. Her level of consciousness improved.  She was started on CPAP, with immediate improvement of symptoms. The patient had a recent 10-12 pound weight gain, with decreased urine output accompanied by increased lower extremity edema.   Subjective: The patient is resting soundly the time of my visit.  Oxygen saturations while asleep are 88-89% and she is taking rapid shallow breaths.  She awakens to during my exam and denies complaints but then very quickly goes back to sleep.  She cannot provide a detailed review of systems due to somnolence.  Assessment & Plan:  Acute hypoxic respiratory failure  Multifactorial:  Acute on chronic diastolic CHF, COPD, OSA/OHS - wean oxygen as able  Acute exacerbation chronic diastolic CHF TTE unable to comment on diastolic function - physical exam consistent with significant volume overload - diurese and follow Filed Weights   01/28/16 0300 01/29/16 0326 01/29/16 0411  Weight: 129.4 kg (285 lb 4.4 oz) 128.6 kg (283 lb 8.2 oz) 128.6 kg (283 lb 8.2 oz)    Acute COPD exacerbation  Continue maximal medical therapy and follow  OSA/OHS BiPAP QHS as patient will allow but note patient has refused already since admission  Acute on Chronic kidney disease stage 3 baseline Cr 1.5-1.6 - follow with ongoing diuresis  Recent Labs Lab 01/27/16 0858 01/28/16 0359 01/29/16 0141  CREATININE 1.71* 1.64* 1.71*    Tobacco abuse Ongoing 1 pack per day habit  Pulmonary hypertension  HTN Blood pressure not at goal - follow trend  DM2 A1c 6.5 - CBG reasonably controlled -  follow without change in treatment plan today  Anemia of chronic disease Hemoglobin 9 at baseline   Recent Labs Lab 01/27/16 0858  HGB 9.4*   Hypothyroidism TSH elevated at 6.8 - Synthroid 25 g daily initiated this hospital stay   Left LE Was wearing an una boot on her LLE - plan for replacement of UNA boot prior to her discharge and for the care of her R sacral wound   Agitation PRN Haldol  Deconditioning OT/PT consulted  Social Psychiatry has evaluated the patient and found her to be competent to make her own healthcare decisions - she will herself therefore have to consent to any disposition plans  DVT prophylaxis: SQ heparin Code Status: DNR / NO CODE Family Communication: no family present at time of exam - Son desired call from physician but patient is not able to remain awake long enough to provide consent for me to share her healthcare information with others and therefore I am not able to call the son to provide protected healthcare information/treatment plans Disposition Plan: SDU   Consultants:  Psychiatry   Procedures: 12/6 TTE - EF 50-55 % - RV moderately dilated - RA moderately dilated - PA peak pressure 36 mmHg  Antimicrobials:  none  Objective: Blood pressure (!) 141/64, pulse 78, temperature 97.7 F (36.5 C), temperature source Oral, resp. rate (!) 22, height 5' (1.524 m), weight 128.6 kg (283 lb 8.2 oz), SpO2 90 %.  Intake/Output Summary (Last 24 hours) at 01/29/16 1644 Last data filed at 01/29/16 0930  Gross per 24  hour  Intake              662 ml  Output             1475 ml  Net             -813 ml   Filed Weights   01/28/16 0300 01/29/16 0326 01/29/16 0411  Weight: 129.4 kg (285 lb 4.4 oz) 128.6 kg (283 lb 8.2 oz) 128.6 kg (283 lb 8.2 oz)    Examination: General: Somnolent Lungs: Poor air movement throughout all fields - no wheezing - basilar crackles Cardiovascular: Regular rate and rhythm without murmur gallop or rub normal S1  and S2 Abdomen: Nontender, nondistended, soft, bowel sounds positive, no rebound, no ascites, no appreciable mass Extremities: 2+ bilateral lower extremity edema  CBC:  Recent Labs Lab 01/27/16 0858  WBC 9.1  NEUTROABS 6.9  HGB 9.4*  HCT 31.5*  MCV 82.9  PLT 399   Basic Metabolic Panel:  Recent Labs Lab 01/27/16 0858 01/28/16 0359 01/29/16 0141  NA 136 138 136  K 3.8 3.9 3.7  CL 98* 100* 97*  CO2 31 28 28   GLUCOSE 111* 44* 125*  BUN 43* 43* 43*  CREATININE 1.71* 1.64* 1.71*  CALCIUM 8.4* 8.2* 8.1*  MG  --   --  2.6*   GFR: Estimated Creatinine Clearance: 34.8 mL/min (by C-G formula based on SCr of 1.71 mg/dL (H)).  Liver Function Tests: No results for input(s): AST, ALT, ALKPHOS, BILITOT, PROT, ALBUMIN in the last 168 hours. No results for input(s): LIPASE, AMYLASE in the last 168 hours. No results for input(s): AMMONIA in the last 168 hours.  Cardiac Enzymes:  Recent Labs Lab 01/27/16 1130 01/27/16 1630 01/27/16 2242  TROPONINI 0.04* 0.04* 0.03*    HbA1C: Hgb A1c MFr Bld  Date/Time Value Ref Range Status  01/27/2016 11:30 AM 6.5 (H) 4.8 - 5.6 % Final    Comment:    (NOTE)         Pre-diabetes: 5.7 - 6.4         Diabetes: >6.4         Glycemic control for adults with diabetes: <7.0     CBG:  Recent Labs Lab 01/28/16 2224 01/29/16 0016 01/29/16 0747 01/29/16 1144 01/29/16 1544  GLUCAP 75 126* 97 177* 170*    Recent Results (from the past 240 hour(s))  MRSA PCR Screening     Status: None   Collection Time: 01/27/16 11:52 AM  Result Value Ref Range Status   MRSA by PCR NEGATIVE NEGATIVE Final    Comment:        The GeneXpert MRSA Assay (FDA approved for NASAL specimens only), is one component of a comprehensive MRSA colonization surveillance program. It is not intended to diagnose MRSA infection nor to guide or monitor treatment for MRSA infections.      Scheduled Meds: . aspirin EC  81 mg Oral Daily  . carvedilol  3.125 mg  Oral BID WC  . cephALEXin  500 mg Oral Q12H  . chlorhexidine  15 mL Mouth Rinse BID  . furosemide  120 mg Intravenous BID  . heparin  5,000 Units Subcutaneous Q8H  . hydrALAZINE  25 mg Oral TID  . insulin aspart  0-9 Units Subcutaneous TID WC  . levothyroxine  25 mcg Oral QAC breakfast  . mouth rinse  15 mL Mouth Rinse q12n4p  . metolazone  5 mg Oral BID  . potassium chloride  40 mEq Oral BID  .  sodium chloride flush  3 mL Intravenous Q12H    LOS: 2 days   Lonia BloodJeffrey T. McClung, MD [facility] Office  [phone] Pager - Text Page per Amion as per below:  On-Call/Text Page:      [email or web address]      password [email or web address]  If 7PM-7AM, please contact night-coverage [email or web address] Password [email or web address] 01/29/2016, 4:44 PM

## 2016-01-30 LAB — CBC AND DIFFERENTIAL
HEMATOCRIT: 31 % — AB (ref 36–46)
Hemoglobin: 8.7 g/dL — AB (ref 12.0–16.0)
PLATELETS: 328 10*3/uL (ref 150–399)
WBC: 7.7 10^3/mL

## 2016-01-30 LAB — COMPREHENSIVE METABOLIC PANEL
ALBUMIN: 2.4 g/dL — AB (ref 3.5–5.0)
ALK PHOS: 83 U/L (ref 38–126)
ALT: 16 U/L (ref 14–54)
ANION GAP: 10 (ref 5–15)
AST: 14 U/L — ABNORMAL LOW (ref 15–41)
BILIRUBIN TOTAL: 0.9 mg/dL (ref 0.3–1.2)
BUN: 45 mg/dL — ABNORMAL HIGH (ref 6–20)
CALCIUM: 8.5 mg/dL — AB (ref 8.9–10.3)
CO2: 30 mmol/L (ref 22–32)
Chloride: 100 mmol/L — ABNORMAL LOW (ref 101–111)
Creatinine, Ser: 1.7 mg/dL — ABNORMAL HIGH (ref 0.44–1.00)
GFR calc non Af Amer: 28 mL/min — ABNORMAL LOW (ref >60)
GFR, EST AFRICAN AMERICAN: 33 mL/min — AB (ref >60)
GLUCOSE: 87 mg/dL (ref 65–99)
POTASSIUM: 4.3 mmol/L (ref 3.5–5.1)
SODIUM: 140 mmol/L (ref 135–145)
TOTAL PROTEIN: 6 g/dL — AB (ref 6.5–8.1)

## 2016-01-30 LAB — HEPATIC FUNCTION PANEL: Bilirubin, Total: 0.9 mg/dL

## 2016-01-30 LAB — GLUCOSE, CAPILLARY
GLUCOSE-CAPILLARY: 149 mg/dL — AB (ref 65–99)
GLUCOSE-CAPILLARY: 223 mg/dL — AB (ref 65–99)
GLUCOSE-CAPILLARY: 168 mg/dL — AB (ref 65–99)
GLUCOSE-CAPILLARY: 116 mg/dL — AB (ref 65–99)

## 2016-01-30 LAB — CBC
HEMATOCRIT: 30.5 % — AB (ref 36.0–46.0)
HEMOGLOBIN: 8.7 g/dL — AB (ref 12.0–15.0)
MCH: 24.2 pg — ABNORMAL LOW (ref 26.0–34.0)
MCHC: 28.5 g/dL — ABNORMAL LOW (ref 30.0–36.0)
MCV: 85 fL (ref 78.0–100.0)
Platelets: 328 10*3/uL (ref 150–400)
RBC: 3.59 MIL/uL — AB (ref 3.87–5.11)
RDW: 19.3 % — ABNORMAL HIGH (ref 11.5–15.5)
WBC: 7.7 10*3/uL (ref 4.0–10.5)

## 2016-01-30 MED ORDER — FUROSEMIDE 10 MG/ML IJ SOLN
80.0000 mg | Freq: Four times a day (QID) | INTRAMUSCULAR | Status: DC
  Administered 2016-01-30 – 2016-02-03 (×14): 80 mg via INTRAVENOUS
  Filled 2016-01-30 (×15): qty 8

## 2016-01-30 MED ORDER — HALOPERIDOL LACTATE 5 MG/ML IJ SOLN
2.0000 mg | Freq: Four times a day (QID) | INTRAMUSCULAR | Status: DC | PRN
  Administered 2016-01-31: 2 mg via INTRAVENOUS
  Filled 2016-01-30: qty 1

## 2016-01-30 MED ORDER — METOLAZONE 5 MG PO TABS
5.0000 mg | ORAL_TABLET | Freq: Every day | ORAL | Status: DC
  Administered 2016-01-30 – 2016-02-01 (×3): 5 mg via ORAL
  Filled 2016-01-30 (×3): qty 1

## 2016-01-30 NOTE — Progress Notes (Signed)
Patient refused BIPAP at this time. Patient is currently on 5L [place] with O2 sat 92%. RT will continue to monitor as needed.

## 2016-01-30 NOTE — Progress Notes (Signed)
[place] TEAM 1 - Stepdown/ICU TEAM  Sue Roach  FAO:[id number] DOB: [birth year] DOA: 01/27/2016 PCP: Kaleen MaskELKINS,WILSON OLIVER, MD    Brief Narrative:  75 y.o.F Hx Chronic Diastolic CHF, DM2, and HTN who presented with acute shortness of breath. When EMS arrived she was slumped over in a chair, minimally responsive. Her O2 sats were 61% on RA. EMS applied 4 L with improvement to 83%. Her level of consciousness improved.  She was started on CPAP, with immediate improvement of symptoms. The patient had a recent 10-12 pound weight gain, with decreased urine output accompanied by increased lower extremity edema.   Subjective: The patient is much more alert and interactive today.  She tells me she still feels short of breath and that this is only improved a very slight bit.  She denies chest pain fevers chills nausea vomiting or abdominal pain.  She tells me she would be willing to be admitted into a nursing facility for long-term rehabilitation but that her insurance will pay for it.  I have assured her that I'll ask case manager to look into this.  Assessment & Plan:  Acute hypoxic respiratory failure  Multifactorial:  Acute on chronic diastolic CHF, COPD, OSA/OHS - wean oxygen as able  Acute exacerbation chronic diastolic CHF TTE unable to comment on diastolic function - physical exam consistent with significant volume overload - cont to diurese and follow - net negative only ~800cc thus far  [employer] PowerFiled Weights   01/29/16 0326 01/29/16 0411 01/30/16 0400  Weight: 128.6 kg (283 lb 8.2 oz) 128.6 kg (283 lb 8.2 oz) 125.3 kg (276 lb 3.8 oz)    Acute COPD exacerbation  Continue maximal medical therapy and follow - wheezing appears to have improved significantly   OSA/OHS BiPAP QHS as patient will allow but note patient has refused already since admission - spoke w/ her today about importance of compliance   Acute on Chronic kidney disease stage 3 baseline Cr 1.5-1.6 - follow with  ongoing diuresis - holding steady for now   Recent Labs Lab 01/27/16 0858 01/28/16 0359 01/29/16 0141 01/30/16 0155  CREATININE 1.71* 1.64* 1.71* 1.70*    Tobacco abuse Ongoing 1 pack per day habit  Pulmonary hypertension  HTN Blood pressure well controlled at this time  DM2 A1c 6.5 - CBG reasonably controlled - follow without change in treatment plan today  Anemia of chronic disease Hemoglobin 9 at baseline - stable at this time  Recent Labs Lab 01/27/16 0858 01/30/16 0155  HGB 9.4* 8.7*   Hypothyroidism TSH elevated at 6.8 - Synthroid 25 g daily initiated this hospital stay   Left LE Was wearing an una boot on her LLE - plan for replacement of UNA boot prior to her discharge   Agitation PRN Haldol  Deconditioning OT/PT consulted  Social Psychiatry has evaluated the patient and found her to be competent to make her own healthcare decisions - she will herself therefore have to consent to any disposition plans  DVT prophylaxis: SQ heparin Code Status: DNR / NO CODE Family Communication: Spoke with son at bedside - patient gave me permission today to speak with either for signs or her daughter in person or via telephone concerning her medical condition and treatment plans Disposition Plan: SDU   Consultants:  Psychiatry   Procedures: 12/6 TTE - EF 50-55 % - RV moderately dilated - RA moderately dilated - PA peak pressure 36 mmHg  Antimicrobials:  none  Objective: Blood pressure (!) 122/51, pulse 67, temperature 97.6 F (  36.4 C), temperature source Oral, resp. rate (!) 25, height 5' (1.524 m), weight 125.3 kg (276 lb 3.8 oz), SpO2 (!) 88 %.  Intake/Output Summary (Last 24 hours) at 01/30/16 1650 Last data filed at 01/30/16 1000  Gross per 24 hour  Intake              940 ml  Output             1535 ml  Net             -595 ml   Filed Weights   01/29/16 0326 01/29/16 0411 01/30/16 0400  Weight: 128.6 kg (283 lb 8.2 oz) 128.6 kg (283 lb 8.2  oz) 125.3 kg (276 lb 3.8 oz)    Examination: General: Alert and conversant Lungs: Poor air movement throughout all fields - very distant breath sounds throughout - no active wheezing Cardiovascular: Very distant heart sounds - no Gallup or rub appreciated Abdomen: Nontender, morbidly obese, soft, bowel sounds positive, no rebound, no ascites, no appreciable mass Extremities: 2+ bilateral lower extremity edema  CBC:  Recent Labs Lab 01/27/16 0858 01/30/16 0155  WBC 9.1 7.7  NEUTROABS 6.9  --   HGB 9.4* 8.7*  HCT 31.5* 30.5*  MCV 82.9 85.0  PLT 399 328   Basic Metabolic Panel:  Recent Labs Lab 01/27/16 0858 01/28/16 0359 01/29/16 0141 01/30/16 0155  NA 136 138 136 140  K 3.8 3.9 3.7 4.3  CL 98* 100* 97* 100*  CO2 31 28 28 30   GLUCOSE 111* 44* 125* 87  BUN 43* 43* 43* 45*  CREATININE 1.71* 1.64* 1.71* 1.70*  CALCIUM 8.4* 8.2* 8.1* 8.5*  MG  --   --  2.6*  --    GFR: Estimated Creatinine Clearance: 34.4 mL/min (by C-G formula based on SCr of 1.7 mg/dL (H)).  Liver Function Tests:  Recent Labs Lab 01/30/16 0155  AST 14*  ALT 16  ALKPHOS 83  BILITOT 0.9  PROT 6.0*  ALBUMIN 2.4*    Cardiac Enzymes:  Recent Labs Lab 01/27/16 1130 01/27/16 1630 01/27/16 2242  TROPONINI 0.04* 0.04* 0.03*    HbA1C: Hgb A1c MFr Bld  Date/Time Value Ref Range Status  01/27/2016 11:30 AM 6.5 (H) 4.8 - 5.6 % Final    Comment:    (NOTE)         Pre-diabetes: 5.7 - 6.4         Diabetes: >6.4         Glycemic control for adults with diabetes: <7.0     CBG:  Recent Labs Lab 01/29/16 1144 01/29/16 1544 01/29/16 2037 01/30/16 0829 01/30/16 1252  GLUCAP 177* 170* 124* 149* 223*    Recent Results (from the past 240 hour(s))  MRSA PCR Screening     Status: None   Collection Time: 01/27/16 11:52 AM  Result Value Ref Range Status   MRSA by PCR NEGATIVE NEGATIVE Final    Comment:        The GeneXpert MRSA Assay (FDA approved for NASAL specimens only), is one  component of a comprehensive MRSA colonization surveillance program. It is not intended to diagnose MRSA infection nor to guide or monitor treatment for MRSA infections.      Scheduled Meds: . aspirin EC  81 mg Oral Daily  . carvedilol  3.125 mg Oral BID WC  . cephALEXin  500 mg Oral Q12H  . chlorhexidine  15 mL Mouth Rinse BID  . furosemide  80 mg Intravenous Q8H  . heparin  5,000 Units Subcutaneous Q8H  . hydrALAZINE  25 mg Oral TID  . insulin aspart  0-9 Units Subcutaneous TID WC  . levothyroxine  25 mcg Oral QAC breakfast  . mouth rinse  15 mL Mouth Rinse q12n4p  . potassium chloride  40 mEq Oral BID    LOS: 3 days   Lonia BloodJeffrey T. McClung, MD [facility] Office  [phone] Pager - Text Page per Loretha StaplerAmion as per below:  On-Call/Text Page:      [email or web address]      password [email or web address]  If 7PM-7AM, please contact night-coverage [email or web address] Password [email or web address] 01/30/2016, 4:50 PM

## 2016-01-31 LAB — RENAL FUNCTION PANEL
ALBUMIN: 2.4 g/dL — AB (ref 3.5–5.0)
ANION GAP: 7 (ref 5–15)
BUN: 52 mg/dL — ABNORMAL HIGH (ref 6–20)
CALCIUM: 8.4 mg/dL — AB (ref 8.9–10.3)
CO2: 36 mmol/L — ABNORMAL HIGH (ref 22–32)
Chloride: 96 mmol/L — ABNORMAL LOW (ref 101–111)
Creatinine, Ser: 1.72 mg/dL — ABNORMAL HIGH (ref 0.44–1.00)
GFR calc non Af Amer: 28 mL/min — ABNORMAL LOW (ref >60)
GFR, EST AFRICAN AMERICAN: 32 mL/min — AB (ref >60)
Glucose, Bld: 109 mg/dL — ABNORMAL HIGH (ref 65–99)
PHOSPHORUS: 4.7 mg/dL — AB (ref 2.5–4.6)
Potassium: 4.1 mmol/L (ref 3.5–5.1)
SODIUM: 139 mmol/L (ref 135–145)

## 2016-01-31 LAB — BASIC METABOLIC PANEL
BUN: 52 mg/dL — AB (ref 4–21)
Creatinine: 1.7 mg/dL — AB (ref 0.5–1.1)
Glucose: 109 mg/dL
POTASSIUM: 4.1 mmol/L (ref 3.4–5.3)
SODIUM: 139 mmol/L (ref 137–147)

## 2016-01-31 LAB — GLUCOSE, CAPILLARY
GLUCOSE-CAPILLARY: 107 mg/dL — AB (ref 65–99)
GLUCOSE-CAPILLARY: 217 mg/dL — AB (ref 65–99)
GLUCOSE-CAPILLARY: 137 mg/dL — AB (ref 65–99)
Glucose-Capillary: 120 mg/dL — ABNORMAL HIGH (ref 65–99)

## 2016-01-31 MED ORDER — DIPHENHYDRAMINE HCL 25 MG PO CAPS
25.0000 mg | ORAL_CAPSULE | Freq: Four times a day (QID) | ORAL | Status: DC | PRN
  Administered 2016-01-31 – 2016-02-02 (×2): 25 mg via ORAL
  Filled 2016-01-31 (×2): qty 1

## 2016-01-31 NOTE — Progress Notes (Signed)
[place] TEAM 1 - Stepdown/ICU TEAM  Derald Macleodarolyn Calvert-Morse  UJW:[id number]:[id number] DOB: February 03, 1941 DOA: 01/27/2016 PCP: Kaleen MaskELKINS,WILSON OLIVER, MD    Brief Narrative:  75 y.o.F Hx Chronic Diastolic CHF, DM2, and HTN who presented with acute shortness of breath. When EMS arrived she was slumped over in a chair, minimally responsive. Her O2 sats were 61% on RA. EMS applied 4 L with improvement to 83%. Her level of consciousness improved.  She was started on CPAP, with immediate improvement of symptoms. The patient had a recent 10-12 pound weight gain, with decreased urine output accompanied by increased lower extremity edema.   Subjective: The patient is awake and conversant.  She does not remember me or speaking to me at length yesterday.  She states her breathing is somewhat better.  She asked me when she can go home.  I have again advised her that I don't feel it would be safe for her to live alone anymore and that a skilled nursing facility/rehabilitation facility would be the most appropriate disposition for her.  She acts as if this is the first time she has heard this (we discussed this yesterday) but again she tells me she is in agreement with this if I feel it is best.  She denies chest pain nausea vomiting or abdominal pain.  She complains that the food is not good.  Assessment & Plan:  Disposition The patient has multiple chronic incurable medical illnesses that are all impacting her respiratory status/dyspnea - per my discussion with her today she continues to desire ongoing medical therapy to maximize control of these issues - she and her son are made aware that control is the best we can hope for and that progression of these issues and therefore worsening of her difficult to control dyspnea is very likely - at this stage we plan to continue to treat the things that we can treat but recognize that she will progress at some point beyond the point of benefiting from ongoing medical care at which  time a transition to comfort focused care will be most appropriate - I will ask palliative care to assist the patient in formulating very clear goals of care / a treatment plan for her skilled nursing facility in attempt to prevent future hospitalizations and a smooth transition to full comfort care at such time that she is no longer responding to medical treatment  Acute hypoxic respiratory failure  Multifactorial:  Acute on chronic diastolic CHF, COPD, OSA/OHS - wean oxygen as able  Acute exacerbation chronic diastolic CHF TTE unable to comment on diastolic function - physical exam consistent with significant volume overload - cont to diurese and follow - net negative ~3400cc thus far  [employer]iled Weights   01/29/16 0411 01/30/16 0400 01/31/16 0414  Weight: 128.6 kg (283 lb 8.2 oz) 125.3 kg (276 lb 3.8 oz) 126.4 kg (278 lb 10.6 oz)    Acute COPD exacerbation  Continue maximal medical therapy and follow - no wheezing at time of exam today   OSA/OHS BiPAP QHS as patient will allow but note patient has refused already since admission  Acute on Chronic kidney disease stage 3 baseline Cr 1.5-1.6 - follow with ongoing diuresis - crt continues to hold steady for now   Recent Labs Lab 01/27/16 0858 01/28/16 0359 01/29/16 0141 01/30/16 0155 01/31/16 0259  CREATININE 1.71* 1.64* 1.71* 1.70* 1.72*    Tobacco abuse ongoing 1 pack per day habit  Pulmonary hypertension  HTN Blood pressure well controlled at this time  DM2 A1c 6.5 - CBG reasonably controlled - follow without change in treatment plan today  Anemia of chronic disease Hemoglobin 9 at baseline - stable at this time  Recent Labs Lab 01/27/16 0858 01/30/16 0155  HGB 9.4* 8.7*   Hypothyroidism TSH elevated at 6.8 - Synthroid 25 g daily initiated this hospital stay   Left LE Was wearing an una boot on her LLE    Agitation PRN Haldol  Deconditioning OT/PT consulted  Social Psychiatry has evaluated the  patient and found her to be competent to make her own healthcare decisions - she will herself therefore have to consent to any disposition plans  DVT prophylaxis: SQ heparin Code Status: DNR / NO CODE Family Communication: Spoke with her other son at bedside and in the hallway at length  Disposition Plan: SDU   Consultants:  Psychiatry   Procedures: 12/6 TTE - EF 50-55 % - RV moderately dilated - RA moderately dilated - PA peak pressure 36 mmHg  Antimicrobials:  Kelfex 12/6 >   Objective: Blood pressure 120/63, pulse (!) 59, temperature 97.4 F (36.3 C), temperature source Oral, resp. rate (!) 24, height 5' (1.524 m), weight 126.4 kg (278 lb 10.6 oz), SpO2 93 %.  Intake/Output Summary (Last 24 hours) at 01/31/16 1555 Last data filed at 01/31/16 9604  Gross per 24 hour  Intake              720 ml  Output             1951 ml  Net            -1231 ml   Filed Weights   01/29/16 0411 01/30/16 0400 01/31/16 0414  Weight: 128.6 kg (283 lb 8.2 oz) 125.3 kg (276 lb 3.8 oz) 126.4 kg (278 lb 10.6 oz)    Examination: General: Alert and conversant - grumpy Lungs: Poor air movement throughout all fields - very distant breath sounds throughout - no wheezing Cardiovascular: Very distant heart sounds - no gallup or rub  Abdomen: Nontender, morbidly obese, soft, bowel sounds positive, no rebound, no ascites, no appreciable mass Extremities: 2+ bilateral lower extremity edema with perhaps some minimal improvement   CBC:  Recent Labs Lab 01/27/16 0858 01/30/16 0155  WBC 9.1 7.7  NEUTROABS 6.9  --   HGB 9.4* 8.7*  HCT 31.5* 30.5*  MCV 82.9 85.0  PLT 399 328   Basic Metabolic Panel:  Recent Labs Lab 01/27/16 0858 01/28/16 0359 01/29/16 0141 01/30/16 0155 01/31/16 0259  NA 136 138 136 140 139  K 3.8 3.9 3.7 4.3 4.1  CL 98* 100* 97* 100* 96*  CO2 31 28 28 30  36*  GLUCOSE 111* 44* 125* 87 109*  BUN 43* 43* 43* 45* 52*  CREATININE 1.71* 1.64* 1.71* 1.70* 1.72*  CALCIUM 8.4*  8.2* 8.1* 8.5* 8.4*  MG  --   --  2.6*  --   --   PHOS  --   --   --   --  4.7*   GFR: Estimated Creatinine Clearance: 34.2 mL/min (by C-G formula based on SCr of 1.72 mg/dL (H)).  Liver Function Tests:  Recent Labs Lab 01/30/16 0155 01/31/16 0259  AST 14*  --   ALT 16  --   ALKPHOS 83  --   BILITOT 0.9  --   PROT 6.0*  --   ALBUMIN 2.4* 2.4*    Cardiac Enzymes:  Recent Labs Lab 01/27/16 1130 01/27/16 1630 01/27/16 2242  TROPONINI 0.04* 0.04* 0.03*  HbA1C: Hgb A1c MFr Bld  Date/Time Value Ref Range Status  01/27/2016 11:30 AM 6.5 (H) 4.8 - 5.6 % Final    Comment:    (NOTE)         Pre-diabetes: 5.7 - 6.4         Diabetes: >6.4         Glycemic control for adults with diabetes: <7.0     CBG:  Recent Labs Lab 01/30/16 1252 01/30/16 1649 01/30/16 2018 01/31/16 0722 01/31/16 1117  GLUCAP 223* 168* 116* 107* 217*    Recent Results (from the past 240 hour(s))  MRSA PCR Screening     Status: None   Collection Time: 01/27/16 11:52 AM  Result Value Ref Range Status   MRSA by PCR NEGATIVE NEGATIVE Final    Comment:        The GeneXpert MRSA Assay (FDA approved for NASAL specimens only), is one component of a comprehensive MRSA colonization surveillance program. It is not intended to diagnose MRSA infection nor to guide or monitor treatment for MRSA infections.      Scheduled Meds: . aspirin EC  81 mg Oral Daily  . carvedilol  3.125 mg Oral BID WC  . cephALEXin  500 mg Oral Q12H  . chlorhexidine  15 mL Mouth Rinse BID  . furosemide  80 mg Intravenous Q6H  . heparin  5,000 Units Subcutaneous Q8H  . hydrALAZINE  25 mg Oral TID  . insulin aspart  0-9 Units Subcutaneous TID WC  . levothyroxine  25 mcg Oral QAC breakfast  . mouth rinse  15 mL Mouth Rinse q12n4p  . metolazone  5 mg Oral Daily  . potassium chloride  40 mEq Oral BID    LOS: 4 days   Lonia BloodJeffrey T. Quinlin Conant, MD [facility] Office  [phone] Pager - Text Page per Amion as  per below:  On-Call/Text Page:      [email or web address]      password [email or web address]  If 7PM-7AM, please contact night-coverage [email or web address] Password [email or web address] 01/31/2016, 3:55 PM

## 2016-01-31 NOTE — Progress Notes (Signed)
Rn called RT to place pt on bipap for the evening per MD request. Pt refused bipap at this time stating that "the doctor did not tell me to wear it and if you want to call him and he can tell it to my ear" RN made aware. RT will ensure MD to talk with pt in AM about importance of wearing it. RT will continue to monitor in case pt changes mind and wants to wear bipap.

## 2016-02-01 DIAGNOSIS — Z515 Encounter for palliative care: Secondary | ICD-10-CM

## 2016-02-01 DIAGNOSIS — Z7189 Other specified counseling: Secondary | ICD-10-CM

## 2016-02-01 LAB — BASIC METABOLIC PANEL
ANION GAP: 11 (ref 5–15)
BUN: 59 mg/dL — AB (ref 4–21)
BUN: 59 mg/dL — ABNORMAL HIGH (ref 6–20)
CO2: 34 mmol/L — AB (ref 22–32)
Calcium: 8.7 mg/dL — ABNORMAL LOW (ref 8.9–10.3)
Chloride: 96 mmol/L — ABNORMAL LOW (ref 101–111)
Creatinine, Ser: 1.81 mg/dL — ABNORMAL HIGH (ref 0.44–1.00)
Creatinine: 1.8 mg/dL — AB (ref 0.5–1.1)
GFR calc Af Amer: 30 mL/min — ABNORMAL LOW (ref >60)
GFR, EST NON AFRICAN AMERICAN: 26 mL/min — AB (ref >60)
GLUCOSE: 101 mg/dL — AB (ref 65–99)
Glucose: 101 mg/dL
POTASSIUM: 4.2 mmol/L (ref 3.5–5.1)
Potassium: 4.2 mmol/L (ref 3.4–5.3)
SODIUM: 141 mmol/L (ref 137–147)
Sodium: 141 mmol/L (ref 135–145)

## 2016-02-01 LAB — GLUCOSE, CAPILLARY
GLUCOSE-CAPILLARY: 194 mg/dL — AB (ref 65–99)
GLUCOSE-CAPILLARY: 209 mg/dL — AB (ref 65–99)
GLUCOSE-CAPILLARY: 129 mg/dL — AB (ref 65–99)
Glucose-Capillary: 117 mg/dL — ABNORMAL HIGH (ref 65–99)

## 2016-02-01 MED ORDER — HALOPERIDOL LACTATE 5 MG/ML IJ SOLN: 1.0000 mg | Freq: Four times a day (QID) | INTRAMUSCULAR | Status: DC | PRN

## 2016-02-01 NOTE — Consult Note (Signed)
Consultation Note Date: 02/01/2016   Patient Name: Sue Roach  DOB: 11/24/1940  MRN: [id number]  Age / Sex: 76 y.o., female  PCP: Leonard Downing, MD Referring Physician: Cherene Altes, MD  Reason for Consultation: Establishing goals of care  HPI/Patient Profile: 75 y.o. female  with past medical history of CHF (diastolic, EF 74-82%, LVH, systolic fx normal), CKD, chronic venous stasis, DM2, HTN, admitted on 01/27/2016 with SOB and hypoxia. She was found at home by her daughter, slumped over, EMS was called and she was 61% on RA. She required CPAP IV diuresis. Admitted for treatment of CHF exacerbation. Palliative medicine consulted for [place].  Clinical Assessment and Goals of Care: Met with patient at bedside. She is alert and oriented. Worked previously as an Field seismologist. She was living at home alone and was able to complete her ADL's independently. This is her second admission in the last year for CHF exacerbation with hypoxia. She is DNR status, but desires all other medical treatment. Her goal is to stabilize and return home. Her son, [name] expressed concern regarding her safety living at home alone. This is validated by the fact she has twice been found at home hypoxic. She currently is unable to independently complete her ADL's and required full assistance for transfer from bed to chair.  Prior to her son arriving, I discussed with Ms. Calvert-Morse the trajectory of CHF. She has noted decline in her status and is aware she has a progressive illness that will have increased exacerbations until she dies. She feels she is on a downhill slide and her time is limited. She does desire to try and return home, but she is aware this may not happen. She requests facilitated conversation with all her children present. I contacted her son, Inocente Salles, and arrangements have been made for family meeting to be  held on Wednesday 12/13 at 1100.  We also discussed follow up by palliative medicine for her at discharge. She is not yet ready to proceed with Hospice care but notes that palliative follow up would be helpful for symptom management and continued GOC.     Primary Decision Maker PATIENT    SUMMARY OF RECOMMENDATIONS -Community palliative f/u at discharge for continued GOC and symptom management     Code Status/Advance Care Planning:  DNR    Palliative Prophylaxis:   Frequent Pain Assessment   Psycho-social/Spiritual:   Desire for further Chaplaincy support:no  Additional Recommendations: Education on Hospice  Prognosis:    < 6 months  Discharge Planning: To Be Determined  Primary Diagnoses: Present on Admission: **None**   I have reviewed the medical record, interviewed the patient and family, and examined the patient. The following aspects are pertinent.  Past Medical History:  Diagnosis Date  . CHF (congestive heart failure) ([place])   . Diabetes mellitus without complication ([place])   . Hypertension   . Renal disorder    Social History   Social History  . Marital status: Widowed    Spouse name: N/A  . Number  of children: N/A  . Years of education: N/A   Social History Main Topics  . Smoking status: Current Every Day Smoker    Packs/day: 1.00    Types: Cigarettes  . Smokeless tobacco: Never Used  . Alcohol use No  . Drug use: No  . Sexual activity: Not Asked   Other Topics Concern  . None   Social History Narrative  . None   History reviewed. No pertinent family history. Scheduled Meds: . aspirin EC  81 mg Oral Daily  . carvedilol  3.125 mg Oral BID WC  . cephALEXin  500 mg Oral Q12H  . chlorhexidine  15 mL Mouth Rinse BID  . furosemide  80 mg Intravenous Q6H  . heparin  5,000 Units Subcutaneous Q8H  . hydrALAZINE  25 mg Oral TID  . insulin aspart  0-9 Units Subcutaneous TID WC  . levothyroxine  25 mcg Oral QAC breakfast  . mouth rinse   15 mL Mouth Rinse q12n4p  . metolazone  5 mg Oral Daily  . potassium chloride  40 mEq Oral BID   Continuous Infusions: PRN Meds:.acetaminophen, ALPRAZolam, diphenhydrAMINE, docusate sodium, haloperidol lactate, ondansetron (ZOFRAN) IV Medications Prior to Admission:  Prior to Admission medications   Medication Sig Start Date End Date Taking? Authorizing Provider  amLODipine (NORVASC) 10 MG tablet Take 10 mg by mouth daily. 11/03/15  Yes Historical Provider, MD  aspirin EC 81 MG tablet Take 81 mg by mouth daily.   Yes Historical Provider, MD  docusate sodium (COLACE) 100 MG capsule Take 100 mg by mouth 2 (two) times daily as needed for constipation.   Yes Historical Provider, MD  furosemide (LASIX) 80 MG tablet Take 80 mg by mouth every 12 (twelve) hours. Max 3 tabs in 24 hours 12/21/15  Yes Historical Provider, MD  glimepiride (AMARYL) 4 MG tablet Take 8 mg by mouth every morning. 12/28/15  Yes Historical Provider, MD  hydrALAZINE (APRESOLINE) 25 MG tablet Take 25 mg by mouth 3 (three) times daily. 11/04/15  Yes Historical Provider, MD  metFORMIN (GLUCOPHAGE) 1000 MG tablet Take 1,000 mg by mouth 2 (two) times daily.   Yes Historical Provider, MD  pioglitazone (ACTOS) 45 MG tablet Take 45 mg by mouth daily. 11/20/15  Yes Historical Provider, MD  traMADol (ULTRAM) 50 MG tablet Take 25 mg by mouth every 8 (eight) hours as needed for fluid. 01/25/16  Yes Historical Provider, MD   Allergies  Allergen Reactions  . Sulfa Antibiotics    Review of Systems  Constitutional: Positive for activity change, appetite change and fatigue.  Respiratory: Positive for cough and shortness of breath.   Cardiovascular: Positive for leg swelling.  Psychiatric/Behavioral: Negative for dysphoric mood.  All other systems reviewed and are negative.   Physical Exam  Constitutional: She is oriented to person, place, and time. She appears well-developed and well-nourished. No distress.  HENT:  Head: Normocephalic and  atraumatic.  Eyes: EOM are normal.  Cardiovascular: Normal rate and regular rhythm.   Distant heart sounds   Pulmonary/Chest: Effort normal. She has wheezes.  Musculoskeletal: Normal range of motion. She exhibits edema.  Neurological: She is alert and oriented to person, place, and time.  Skin: Skin is warm and dry.  LLE dry, red  Psychiatric: She has a normal mood and affect. Her behavior is normal. Judgment and thought content normal.    Vital Signs: BP (!) 128/93 (BP Location: Left Wrist)   Pulse 65   Temp 97.6 F (36.4 C) (Oral)  Resp (!) 22   Ht 5' (1.524 m)   Wt 122.4 kg (269 lb 13.5 oz)   SpO2 94%   BMI 52.70 kg/m  Pain Assessment: No/denies pain POSS *See Group Information*: S-Acceptable,Sleep, easy to arouse Pain Score: Asleep   SpO2: SpO2: 94 % O2 Device:SpO2: 94 % O2 Flow Rate: .O2 Flow Rate (L/min): 6 L/min  IO: Intake/output summary:  Intake/Output Summary (Last 24 hours) at 02/01/16 1236 Last data filed at 02/01/16 1483  Gross per 24 hour  Intake              600 ml  Output             3050 ml  Net            -2450 ml    LBM: Last BM Date: 01/31/16 Baseline Weight: Weight: 129.3 kg (285 lb) Most recent weight: Weight: 122.4 kg (269 lb 13.5 oz)     Palliative Assessment/Data: PPS: 40%   Flowsheet Rows   Flowsheet Row Most Recent Value  Intake Tab  Referral Department  Hospitalist  Unit at Time of Referral  ICU  Palliative Care Primary Diagnosis  Cardiac  Date Notified  01/31/16  Palliative Care Type  New Palliative care  Reason for referral  Clarify Goals of Care  Date of Admission  01/27/16  # of days IP prior to Palliative referral  4  Clinical Assessment  Psychosocial & Spiritual Assessment  Palliative Care Outcomes      Thank you for this consult. Palliative medicine will continue to follow and assist as needed.   Time In: 0900 Time Out:1030 Time Total:90 minutes  Greater than 50%  of this time was spent counseling and  coordinating care related to the above assessment and plan.  Signed by: Mariana Kaufman, AGNP-C Palliative Medicine    Please contact Palliative Medicine Team phone at [phone] for questions and concerns.  For individual provider: See Shea Evans

## 2016-02-01 NOTE — Progress Notes (Signed)
Patient admitted from 4N via wheelchair. Patient assisted to bed. Telemetry, box #9, placed; CCMD notified. Vitals taken. Oxygen saturations 80%. Per report from 4N RN, MD is aware of patient's sats being in "80-90's." MD paged.

## 2016-02-01 NOTE — Clinical Social Work Note (Signed)
Clinical Social Work Assessment  Patient Details  Name: Sue Roach MRN: [id number] Date of Birth: 1940/11/13  Date of referral:  02/01/16               Reason for consult:  Facility Placement                Permission sought to share information with:  Facility Sport and exercise psychologist, Family Supports Permission granted to share information::  Yes, Verbal Permission Granted  Name::     [facility] or [facility]::  SNFs  Relationship::  Adult Research scientist (life sciences) Information:     Housing/Transportation Living arrangements for the past 2 months:  Single Family Home Source of Information:  Patient, Adult Children Patient Interpreter Needed:  None Criminal Activity/Legal Involvement Pertinent to Current Situation/Hospitalization:  No - Comment as needed Significant Relationships:  Adult Children Lives with:  Self Do you feel safe going back to the place where you live?  No Need for family participation in patient care:  Yes (Comment)  Care giving concerns:  CSW received consult for possible SNF placement at time of discharge. CSW met with patient and patient's son, [name], at bedside regarding PT recommendation of SNF placement at time of discharge. Per patient's sson, patient lives alone and is currently unable to care for herself at home given patient's current physical needs and fall risk. Patient and patient's son expressed understanding of PT recommendation and are agreeable to SNF placement at time of discharge. Patient's daughter, [name], lives locally and can help decide on SNFs.CSW to continue to follow and assist with discharge planning needs.   Social Worker assessment / plan:  CSW spoke with patient and patient's son concerning possibility of rehab at [facility] before returning home.  Employment status:  Retired Nurse, adult PT Recommendations:  [place] / Referral to community resources:  [place]  Patient/Family's Response to care:  Though she would rather discharge home, the patient and patient's son recognize need for rehab before returning home and are agreeable to a SNF in [facility].   Patient/Family's Understanding of and Emotional Response to Diagnosis, Current Treatment, and Prognosis:  Patient/family is realistic regarding therapy needs and expressed being hopeful for SNF placement. Patient expressed understanding of CSW role and discharge process. No questions/concerns about plan or treatment.    Emotional Assessment Appearance:  Appears stated age Attitude/Demeanor/Rapport:  Other (Appropriate) Affect (typically observed):  Accepting, Appropriate Orientation:  Oriented to Self, Oriented to Place, Oriented to  Time, Oriented to Situation Alcohol / Substance use:  Not Applicable Psych involvement (Current and /or in the community):  No (Comment)  Discharge Needs  Concerns to be addressed:  Care Coordination Readmission within the last 30 days:  No Current discharge risk:  None Barriers to Discharge:  Continued Medical Work up   [employer], [place] 02/01/2016, 1:27 PM

## 2016-02-01 NOTE — Progress Notes (Signed)
Sue Roach TEAM 1 - Stepdown/ICU TEAM  Sue Roach  ZOX:[id number] DOB: 15-Aug-1940 DOA: 01/27/2016 PCP: Kaleen MaskELKINS,WILSON OLIVER, MD    Brief Narrative:  [age] y.o.F Hx Chronic Diastolic CHF, DM2, and HTN who presented with acute shortness of breath. When EMS arrived she was slumped over in a chair, minimally responsive. Her O2 sats were 61% on RA. EMS applied 4 L with improvement to 83%. Her level of consciousness improved.  She was started on CPAP, with immediate improvement of symptoms. The patient had a recent 10-12 pound weight gain, with decreased urine output accompanied by increased lower extremity edema.   Subjective: The patient looks dramatically better today.  She is alert and bright and conversant.  She complains of feeling weak in general and mildly short of breath but denies any acute distress.  She reports an improving appetite.  She does feel that she continues to be "a little bit swollen".  Assessment & Plan:  Disposition The patient has multiple chronic incurable medical illnesses that are all impacting her respiratory status/dyspnea - per my discussion with her 12/10 she continues to desire ongoing medical therapy to maximize control of these issues - she and her son are made aware that control is the best we can hope for and that progression of these issues and therefore worsening of her difficult to control dyspnea is very likely - at this stage we plan to continue to treat the things that we can treat but recognize that she will progress at some point beyond the point of benefiting from ongoing medical care at which time a transition to comfort focused care will be most appropriate - I have asked palliative care to assist the patient in formulating very clear goals of care / a treatment plan for her skilled nursing facility in attempt to prevent future hospitalizations and a smooth transition to full comfort care at such time that she is no longer responding to medical  treatment  Acute hypoxic respiratory failure  Multifactorial:  Acute on chronic diastolic CHF, COPD, OSA/OHS - weaning oxygen as able  Acute exacerbation chronic diastolic CHF TTE unable to comment on diastolic function - physical exam consistent with significant volume overload - cont to diurese and follow - net negative ~4800cc thus far  [facility]   01/30/16 0400 01/31/16 0414 02/01/16 0534  Weight: 125.3 kg (276 lb 3.8 oz) 126.4 kg (278 lb 10.6 oz) 122.4 kg (269 lb 13.5 oz)    Acute COPD exacerbation  Continue maximal medical therapy and follow - no wheezing at time of exam today - appears to be stabilizing to an extent  OSA/OHS BiPAP QHS as patient will allow but note patient has refused already since admission  Acute on Chronic kidney disease stage 3 baseline Cr 1.5-1.6 - follow with ongoing diuresis - crt may be slowly trending upward - follow w/ onging diuresis - may soon limit our ability to continue diuresis   Recent Labs Lab 01/28/16 0359 01/29/16 0141 01/30/16 0155 01/31/16 0259 02/01/16 0448  CREATININE 1.64* 1.71* 1.70* 1.72* 1.81*    Tobacco abuse ongoing 1 pack per day habit - counseled on need to stop smoking   Pulmonary hypertension  HTN Blood pressure well controlled at this time  DM2 A1c 6.5 - CBG reasonably controlled - follow without change   Anemia of chronic disease Hemoglobin 9 at baseline - stable at this time w/o evidence of acute blood loss   Recent Labs Lab 01/27/16 0858 01/30/16 0155  HGB 9.4* 8.7*  Hypothyroidism TSH elevated at 6.8 - Synthroid 25 g daily initiated this hospital stay   Left LE Was wearing an una boot on her LLE at admit - this has now been removed, revealing no skin wound at this time - WOC has evaluated    Agitation PRN Haldol  Deconditioning OT/PT consulted and following - plan is for rehab stay at SNF post d/c   Social Psychiatry has evaluated the patient and found her to be competent to  make her own healthcare decisions - she will herself therefore have to consent to any disposition plans  DVT prophylaxis: SQ heparin Code Status: DNR / NO CODE Family Communication: Spoke with her son from [place]. at bedside Disposition Plan: Stable for transfer to telemetry bed - continue attempts to diurese watching renal function - continue rehabilitation efforts - anticipate discharge to skilled nursing facility for rehabilitation stay in approximately 48 hours  Consultants:  Psychiatry  Palliative Care  Procedures: 12/6 TTE - EF 50-55 % - RV moderately dilated - RA moderately dilated - PA peak pressure 36 mmHg  Antimicrobials:  Kelfex 12/6 > 12/11  Objective: Blood pressure (!) 140/59, pulse 61, temperature 97.5 F (36.4 C), temperature source Oral, resp. rate 20, height 5' (1.524 m), weight 122.4 kg (269 lb 13.5 oz), SpO2 93 %.  Intake/Output Summary (Last 24 hours) at 02/01/16 1646 Last data filed at 02/01/16 0659  Gross per 24 hour  Intake              480 ml  Output             2250 ml  Net            -1770 ml   Filed Weights   01/30/16 0400 01/31/16 0414 02/01/16 0534  Weight: 125.3 kg (276 lb 3.8 oz) 126.4 kg (278 lb 10.6 oz) 122.4 kg (269 lb 13.5 oz)    Examination: General: Alert and conversant - Quite pleasant and interactive today Lungs: Poor air movement throughout all fields - very distant breath sounds throughout - no wheezing or focal crackles Cardiovascular: Very distant heart sounds - no gallup or rub - no appreciable murmur Abdomen: Nontender, morbidly obese, soft, bowel sounds positive, no rebound, no ascites, no appreciable mass Extremities: 1+ B LE edmea w/ change of venous stasis dermatitis   CBC:  Recent Labs Lab 01/27/16 0858 01/30/16 0155  WBC 9.1 7.7  NEUTROABS 6.9  --   HGB 9.4* 8.7*  HCT 31.5* 30.5*  MCV 82.9 85.0  PLT 399 328   Basic Metabolic Panel:  Recent Labs Lab 01/28/16 0359 01/29/16 0141 01/30/16 0155 01/31/16 0259  02/01/16 0448  NA 138 136 140 139 141  K 3.9 3.7 4.3 4.1 4.2  CL 100* 97* 100* 96* 96*  CO2 28 28 30  36* 34*  GLUCOSE 44* 125* 87 109* 101*  BUN 43* 43* 45* 52* 59*  CREATININE 1.64* 1.71* 1.70* 1.72* 1.81*  CALCIUM 8.2* 8.1* 8.5* 8.4* 8.7*  MG  --  2.6*  --   --   --   PHOS  --   --   --  4.7*  --    GFR: Estimated Creatinine Clearance: 31.9 mL/min (by C-G formula based on SCr of 1.81 mg/dL (H)).  Liver Function Tests:  Recent Labs Lab 01/30/16 0155 01/31/16 0259  AST 14*  --   ALT 16  --   ALKPHOS 83  --   BILITOT 0.9  --   PROT 6.0*  --  ALBUMIN 2.4* 2.4*    Cardiac Enzymes:  Recent Labs Lab 01/27/16 1130 01/27/16 1630 01/27/16 2242  TROPONINI 0.04* 0.04* 0.03*    HbA1C: Hgb A1c MFr Bld  Date/Time Value Ref Range Status  01/27/2016 11:30 AM 6.5 (H) 4.8 - 5.6 % Final    Comment:    (NOTE)         Pre-diabetes: 5.7 - 6.4         Diabetes: >6.4         Glycemic control for adults with diabetes: <7.0     CBG:  Recent Labs Lab 01/31/16 1607 01/31/16 2128 02/01/16 0730 02/01/16 1105 02/01/16 1559  GLUCAP 137* 120* 117* 194* 209*    Recent Results (from the past 240 hour(s))  MRSA PCR Screening     Status: None   Collection Time: 01/27/16 11:52 AM  Result Value Ref Range Status   MRSA by PCR NEGATIVE NEGATIVE Final    Comment:        The GeneXpert MRSA Assay (FDA approved for NASAL specimens only), is one component of a comprehensive MRSA colonization surveillance program. It is not intended to diagnose MRSA infection nor to guide or monitor treatment for MRSA infections.      Scheduled Meds: . aspirin EC  81 mg Oral Daily  . carvedilol  3.125 mg Oral BID WC  . cephALEXin  500 mg Oral Q12H  . chlorhexidine  15 mL Mouth Rinse BID  . furosemide  80 mg Intravenous Q6H  . heparin  5,000 Units Subcutaneous Q8H  . hydrALAZINE  25 mg Oral TID  . insulin aspart  0-9 Units Subcutaneous TID WC  . levothyroxine  25 mcg Oral QAC breakfast    . mouth rinse  15 mL Mouth Rinse q12n4p  . metolazone  5 mg Oral Daily  . potassium chloride  40 mEq Oral BID    LOS: 5 days   Lonia BloodJeffrey T. Obediah Welles, MD [facility] Office  [phone] Pager - Text Page per Amion as per below:  On-Call/Text Page:      [email or web address]      password [email or web address]  If 7PM-7AM, please contact night-coverage [email or web address] Password [email or web address] 02/01/2016, 4:46 PM

## 2016-02-01 NOTE — Consult Note (Signed)
WOC follow up to check status of the LLE.  Patient was admitted with Unna's boot in place to the LLE.  Today the patient is not on BiPAP any longer and she is able to tell me that her family physician's office placed the original Unna's boot.  We since changed the compression wrap while in house, was due last Friday for change, however at the time of my consultation we were not sure if she had any ulcers under the wraps so I decided to remove to assess the legs.  She requested the Coban layer be removed the next day after re-application and then she requested the zinc paste layer be removed on Friday.  She currently does not have any active ulcerations or weeping. The leg is dry.  I have offered to replace the Unna's boot today and she does not wish to have it replaced.  Reports "its too tight", "it smothers my leg".    Patient to resume follow up for compression wraps with her family physician at the time of DC.  Discussed POC with patient and bedside nurse.  Re consult if needed, will not follow at this time. Thanks  Keziyah Kneale M.D.C. Holdingsustin MSN, RN,CWOCN, CNS [phone])

## 2016-02-01 NOTE — [place] FL2 (Signed)
[place] MEDICA[place] FL2 LEVEL OF CARE SCREENING TOOL     IDENTIFICATION  Patient Name: Sue Roach Birthdate: March 03, 1940 Sex: female Admission Date (Current Location): 01/27/2016  [facility] [place]   Facility and Address:  [facility], [address], [place], [place] [address]      Provider Number: [id number]  Attending Physician Name and Address:  Lonia BloodJeffrey T McClung, MD  Relative Name and Phone Number:  Shaune Pascalheobe, daughter [phone]    Current Level of Care: Hospital Recommended Level of Care: Skilled Nursing Facility Prior Approval Number:    Date Approved/Denied:   PASRR Number: [id number]  Discharge Plan: SNF    Current Diagnoses: Patient Active Problem List   Diagnosis Date Noted  . COPD exacerbation (HCC)   . Obstructive sleep apnea   . Obesity hypoventilation syndrome (HCC)   . Chronic systolic CHF (congestive heart failure) (HCC)   . Dilated cardiomyopathy (HCC)   . Pulmonary hypertension   . Controlled diabetes mellitus type 2 with complications (HCC)   . Acute renal failure with acute tubular necrosis superimposed on stage 2 chronic kidney disease (HCC)   . CHF (congestive heart failure) (HCC) 01/27/2016  . Acute respiratory failure with hypoxia (HCC) 01/27/2016  . Chronic kidney disease 01/27/2016  . Diabetes mellitus, type II (HCC) 01/27/2016  . Leg wound, left 01/27/2016  . Anemia of chronic disease 01/27/2016  . Tobacco abuse 01/27/2016  . Diabetes mellitus with complication (HCC)   . Impetigo 05/29/2015  . Morbid obesity due to excess calories (HCC) 05/29/2015  . Acute on chronic diastolic congestive heart failure (HCC) 05/29/2015    Orientation RESPIRATION BLADDER Height & Weight     Self, Time, Situation, Place  O2 (Nasal cannula 6L; CPAP 14/8) Incontinent, Indwelling catheter Weight: 122.4 kg (269 lb 13.5 oz) Height:  5' (152.4 cm)  BEHAVIORAL SYMPTOMS/MOOD NEUROLOGICAL BOWEL NUTRITION  STATUS      Incontinent Diet (Please see DC Summary)  AMBULATORY STATUS COMMUNICATION OF NEEDS Skin   Limited Assist Verbally Other (Comment) (Wound on leg)                       Personal Care Assistance Level of Assistance  Bathing, Feeding, Dressing Bathing Assistance: Maximum assistance Feeding assistance: Independent Dressing Assistance: Limited assistance     Functional Limitations Info             SPECIAL CARE FACTORS FREQUENCY  PT (By licensed PT), OT (By licensed OT)     PT Frequency: 5x/week OT Frequency: 3x/week            Contractures      Additional Factors Info  Code Status, Allergies, Insulin Sliding Scale Code Status Info: DNR Allergies Info: Sulfa Antibiotics   Insulin Sliding Scale Info: insulin aspart (novoLOG) injection 0-9 Units       Current Medications (02/01/2016):  This is the current hospital active medication list Current Facility-Administered Medications  Medication Dose Route Frequency Provider Last Rate Last Dose  . acetaminophen (TYLENOL) tablet 650 mg  650 mg Oral Q4H PRN Lonia BloodJeffrey T McClung, MD      . ALPRAZolam Prudy Feeler(XANAX) tablet 0.25 mg  0.25 mg Oral BID PRN Marcos EkeSara E Wertman, PA-C   0.25 mg at 01/31/16 1648  . aspirin EC tablet 81 mg  81 mg Oral Daily Marcos EkeSara E Wertman, PA-C   81 mg at 02/01/16 1033  . carvedilol (COREG) tablet 3.125 mg  3.125 mg Oral BID WC Lyda Jesterurtis  Leonides SchanzJ Woods, MD   3.125 mg at 02/01/16 1034  . cephALEXin (KEFLEX) capsule 500 mg  500 mg Oral Q12H Marcos EkeSara E Wertman, PA-C   500 mg at 02/01/16 1232  . chlorhexidine (PERIDEX) 0.12 % solution 15 mL  15 mL Mouth Rinse BID Garlon HatchetLisa M Sanders, PA-C   15 mL at 01/31/16 2058  . diphenhydrAMINE (BENADRYL) capsule 25 mg  25 mg Oral Q6H PRN Lonia BloodJeffrey T McClung, MD   25 mg at 01/31/16 1038  . docusate sodium (COLACE) capsule 100 mg  100 mg Oral BID PRN Marcos EkeSara E Wertman, PA-C   100 mg at 01/31/16 1027  . furosemide (LASIX) injection 80 mg  80 mg Intravenous Q6H Lonia BloodJeffrey T McClung, MD   80 mg at  02/01/16 1232  . haloperidol lactate (HALDOL) injection 2 mg  2 mg Intravenous Q6H PRN Lonia BloodJeffrey T McClung, MD   2 mg at 01/31/16 2254  . heparin injection 5,000 Units  5,000 Units Subcutaneous Q8H Marcos EkeSara E Wertman, PA-C   5,000 Units at 02/01/16 [id number]  . hydrALAZINE (APRESOLINE) tablet 25 mg  25 mg Oral TID Marcos EkeSara E Wertman, PA-C   25 mg at 01/31/16 2057  . insulin aspart (novoLOG) injection 0-9 Units  0-9 Units Subcutaneous TID WC Marcos EkeSara E Wertman, PA-C   2 Units at 02/01/16 1232  . levothyroxine (SYNTHROID, LEVOTHROID) tablet 25 mcg  25 mcg Oral QAC breakfast Drema Dallasurtis J Woods, MD   25 mcg at 01/31/16 1027  . MEDLINE mouth rinse  15 mL Mouth Rinse q12n4p Garlon HatchetLisa M Sanders, PA-C   15 mL at 01/31/16 1600  . metolazone (ZAROXOLYN) tablet 5 mg  5 mg Oral Daily Lonia BloodJeffrey T McClung, MD   5 mg at 02/01/16 1035  . ondansetron (ZOFRAN) injection 4 mg  4 mg Intravenous Q6H PRN Marcos EkeSara E Wertman, PA-C      . potassium chloride SA (K-DUR,KLOR-CON) CR tablet 40 mEq  40 mEq Oral BID Marcos EkeSara E Wertman, PA-C   40 mEq at 01/31/16 2058     Discharge Medications: Please see discharge summary for a list of discharge medications.  Relevant Imaging Results:  Relevant Lab Results:   Additional Information SSN: [id number]   Will need CPAP  Mearl LatinNadia S Keelin Sheridan, LCSWA

## 2016-02-01 NOTE — Progress Notes (Signed)
Occupational Therapy Treatment Patient Details Name: Sue Roach MRN: [id number] DOB: 1941-01-25 Today's Date: 02/01/2016    History of present illness Pt adm with Acute respiratory failure with hypoxia likely due to  Acute on chronic diastolic CHF exacerbation with undiagnosed underlying COPD. PMH - chf,  DM, HTN   OT comments  Pt significantly improved.  She requires min a +2 for functional mobility this date and was able to ambulate ~4' to [facility].  She continues to require max A for LB ADLs and toileting, and demonstrates impaired safety awareness, memory, and problem solving.  Recommend SNF  Follow Up Recommendations  SNF    Equipment Recommendations  None recommended by OT    Recommendations for Other Services      Precautions / Restrictions Precautions Precautions: Fall Restrictions Weight Bearing Restrictions: No       Mobility Bed Mobility Overal bed mobility: Needs Assistance Bed Mobility: Supine to Sit     Supine to sit: Min assist;+2 for safety/equipment     General bed mobility comments: Assist to elevate trunk into sitting  Transfers Overall transfer level: Needs assistance Equipment used: Rolling walker (2 wheeled) Transfers: Sit to/from [employer] CorporationStand;Stand Pivot Transfers Sit to Stand: Min assist;+2 safety/equipment Stand pivot transfers: Min assist;+2 safety/equipment       General transfer comment: Assist to bring hips up and for balance. Verbal cues to keep feet inside walker as she pivoted. Cues to complete turn to chair before sitting down.    Balance Overall balance assessment: Needs assistance Sitting-balance support: No upper extremity supported;Feet supported Sitting balance-Leahy Scale: Good     Standing balance support: Bilateral upper extremity supported Standing balance-Leahy Scale: Poor Standing balance comment: walker and min guard for static standing                   ADL Overall ADL's : Needs assistance/impaired      Grooming: Brushing hair;Minimal assistance;Sitting                   Toilet Transfer: Minimal assistance;+2 for safety/equipment;BSC;RW   Toileting- Clothing Manipulation and Hygiene: Maximal assistance;Sit to/from stand Toileting - Clothing Manipulation Details (indicate cue type and reason): Pt unable to access posterior peri area      Functional mobility during ADLs: Minimal assistance;+2 for safety/equipment;Rolling walker        Vision                     Perception     Praxis      Cognition   Behavior During Therapy: [facility] for tasks assessed/performed Overall Cognitive Status: No family/caregiver present to determine baseline cognitive functioning Area of Impairment: Memory;Safety/judgement;Problem solving     Memory: Decreased short-term memory    Safety/Judgement: Decreased awareness of deficits;Decreased awareness of safety   Problem Solving: Slow processing;Decreased initiation;Requires verbal cues;Requires tactile cues General Comments: Pt cooperative. Pt incontinent of stool with poor awareness.  She requires min cues/assist for problem solving during ADL tasks     Extremity/Trunk Assessment               Exercises     Shoulder Instructions       General Comments      Pertinent Vitals/ Pain       Pain Assessment: No/denies pain  Home Living  Prior Functioning/Environment              Frequency  Min 2X/week        Progress Toward Goals  OT Goals(current goals can now be found in the care plan section)  Progress towards OT goals: Progressing toward goals  Acute Rehab OT Goals Patient Stated Goal: Not stated  Plan Discharge plan remains appropriate    Co-evaluation    PT/OT/SLP Co-Evaluation/Treatment: Yes Reason for Co-Treatment: Complexity of the patient's impairments (multi-system involvement);To address functional/ADL transfers;For patient/therapist  safety   OT goals addressed during session: ADL's and self-care;Strengthening/ROM      End of Session Equipment Utilized During Treatment: Oxygen   Activity Tolerance Patient limited by fatigue   Patient Left in chair;with call bell/phone within reach;with chair alarm set;with nursing/sitter in room   Nurse Communication Mobility status        Time: [id number] OT Time Calculation (min): 38 min  Charges: OT General Charges $OT Visit: 1 Procedure OT Treatments $Self Care/Home Management : 23-37 mins  Daine Croker M 02/01/2016, 4:13 PM

## 2016-02-01 NOTE — Progress Notes (Signed)
Physical Therapy Treatment Patient Details Name: Sue MacleodCarolyn Calvert-Morse MRN: [id number] DOB: 14-Jun-1940 Today's Date: 02/01/2016    History of Present Illness Pt adm with Acute respiratory failure with hypoxia likely due to  Acute on chronic diastolic CHF exacerbation with undiagnosed underlying COPD. PMH - chf,  DM, HTN    PT Comments    Pt much more cooperative. Mobility and cognition improved but still impaired and pt still needs SNF.  Follow Up Recommendations  SNF     Equipment Recommendations  None recommended by PT    Recommendations for Other Services       Precautions / Restrictions Precautions Precautions: Fall Restrictions Weight Bearing Restrictions: No    Mobility  Bed Mobility Overal bed mobility: Needs Assistance Bed Mobility: Supine to Sit     Supine to sit: Min assist;+2 for safety/equipment     General bed mobility comments: Assist to elevate trunk into sitting  Transfers Overall transfer level: Needs assistance Equipment used: Rolling walker (2 wheeled) Transfers: Sit to/from UGI CorporationStand;Stand Pivot Transfers Sit to Stand: Min assist;+2 safety/equipment Stand pivot transfers: Min assist;+2 safety/equipment       General transfer comment: Assist to bring hips up and for balance. Verbal cues to keep feet inside walker as she pivoted. Cues to complete turn to chair before sitting down.  Ambulation/Gait Ambulation/Gait assistance: Min assist;+2 safety/equipment Ambulation Distance (Feet): 3 Feet (x 2 ) Assistive device: Rolling walker (2 wheeled) Gait Pattern/deviations: Step-through pattern;Decreased step length - right;Decreased step length - left;Shuffle;Trunk flexed Gait velocity: decr Gait velocity interpretation: <1.8 ft/sec, indicative of risk for recurrent falls General Gait Details: Assist for balance and support. Verbal cues to keep feet closer to walker   Stairs            Wheelchair Mobility    Modified Rankin (Stroke Patients  Only)       Balance Overall balance assessment: Needs assistance Sitting-balance support: No upper extremity supported;Feet supported Sitting balance-Leahy Scale: Good     Standing balance support: Bilateral upper extremity supported Standing balance-Leahy Scale: Poor Standing balance comment: walker and min guard for static standing                    Cognition Arousal/Alertness: Awake/alert Behavior During Therapy: WFL for tasks assessed/performed Overall Cognitive Status: No family/caregiver present to determine baseline cognitive functioning Area of Impairment: Memory;Safety/judgement;Problem solving     Memory: Decreased short-term memory   Safety/Judgement: Decreased awareness of deficits;Decreased awareness of safety   Problem Solving: Slow processing;Decreased initiation;Requires verbal cues;Requires tactile cues General Comments: Pt cooperative.     Exercises      General Comments        Pertinent Vitals/Pain Pain Assessment: No/denies pain    Home Living                      Prior Function            PT Goals (current goals can now be found in the care plan section) Acute Rehab PT Goals Patient Stated Goal: Not stated Progress towards PT goals: Progressing toward goals    Frequency    Min 3X/week      PT Plan Current plan remains appropriate    Co-evaluation PT/OT/SLP Co-Evaluation/Treatment: Yes Reason for Co-Treatment: For patient/therapist safety         End of Session Equipment Utilized During Treatment: Oxygen;Gait belt Activity Tolerance: Patient tolerated treatment well Patient left: with call bell/phone within reach;in chair;with chair alarm set  Time: [id number] PT Time Calculation (min) (ACUTE ONLY): 37 min  Charges:  $Gait Training: 8-22 mins                    G Codes:      Angelina OkCary W [place] 02/01/2016, 1:01 PM Fluor CorporationCary Dhillon [place] PT [phone]

## 2016-02-02 DIAGNOSIS — Z7189 Other specified counseling: Secondary | ICD-10-CM

## 2016-02-02 DIAGNOSIS — I509 Heart failure, unspecified: Secondary | ICD-10-CM

## 2016-02-02 LAB — GLUCOSE, CAPILLARY
GLUCOSE-CAPILLARY: 214 mg/dL — AB (ref 65–99)
Glucose-Capillary: 116 mg/dL — ABNORMAL HIGH (ref 65–99)
Glucose-Capillary: 135 mg/dL — ABNORMAL HIGH (ref 65–99)
Glucose-Capillary: 113 mg/dL — ABNORMAL HIGH (ref 65–99)

## 2016-02-02 LAB — BASIC METABOLIC PANEL
Anion gap: 13 (ref 5–15)
BUN: 59 mg/dL — AB (ref 4–21)
BUN: 59 mg/dL — AB (ref 6–20)
CHLORIDE: 92 mmol/L — AB (ref 101–111)
CO2: 35 mmol/L — AB (ref 22–32)
CREATININE: 1.6 mg/dL — AB (ref 0.5–1.1)
CREATININE: 1.63 mg/dL — AB (ref 0.44–1.00)
Calcium: 8.6 mg/dL — ABNORMAL LOW (ref 8.9–10.3)
GFR calc Af Amer: 34 mL/min — ABNORMAL LOW (ref >60)
GFR calc non Af Amer: 30 mL/min — ABNORMAL LOW (ref >60)
GLUCOSE: 110 mg/dL
Glucose, Bld: 110 mg/dL — ABNORMAL HIGH (ref 65–99)
POTASSIUM: 3.9 mmol/L (ref 3.4–5.3)
POTASSIUM: 3.9 mmol/L (ref 3.5–5.1)
SODIUM: 140 mmol/L (ref 135–145)
Sodium: 140 mmol/L (ref 137–147)

## 2016-02-02 MED ORDER — IPRATROPIUM-ALBUTEROL 0.5-2.5 (3) MG/3ML IN SOLN
3.0000 mL | Freq: Four times a day (QID) | RESPIRATORY_TRACT | Status: DC
  Administered 2016-02-02 – 2016-02-03 (×3): 3 mL via RESPIRATORY_TRACT
  Filled 2016-02-02 (×3): qty 3

## 2016-02-02 MED ORDER — MORPHINE SULFATE (CONCENTRATE) 10 MG/0.5ML PO SOLN
2.5000 mg | ORAL | Status: DC | PRN
  Filled 2016-02-02: qty 0.5

## 2016-02-02 MED ORDER — METOLAZONE 5 MG PO TABS
5.0000 mg | ORAL_TABLET | Freq: Every day | ORAL | Status: DC
  Administered 2016-02-02 – 2016-02-05 (×4): 5 mg via ORAL
  Filled 2016-02-02 (×4): qty 1

## 2016-02-02 NOTE — Progress Notes (Signed)
Patient's oxygen saturations between 70-80%. MD notified. Non-rebreather placed.

## 2016-02-02 NOTE — Progress Notes (Signed)
Daily Progress Note   Patient Name: Sue Roach       Date: 02/02/2016 DOB: 12-26-1940  Age: 75 y.o. MRN#: [id number] Attending Physician: Rhetta MuraJai-Gurmukh Samtani, MD Primary Care Physician: Kaleen MaskELKINS,WILSON OLIVER, MD Admit Date: 01/27/2016  Reason for Consultation/Follow-up: Establishing goals of care and Non pain symptom management  Subjective: Patient sitting up in chair eating her lunch. Per nursing she recently removed her mask to eat, fell asleep and the quickly desaturated. I noted her to be SOB while talking with Sue Roach and O2 dropped to high 80's. She states she is mostly comfortable. She knows that she is going to SNF and tells me her son is meeting with a realtor today to put her house up for sale. Family meeting is planned for tomorrow at 6711 with her son Sue MartinSam and daughter, Sue Roach. She is prepared to discuss her future health care decisions with them. She is concerned how they will react to hearing that her lifetime is limited. Emotional support given.  Review of Systems  Constitutional: Positive for malaise/fatigue.  Respiratory: Positive for cough and shortness of breath.   Genitourinary: Negative.   Neurological: Positive for weakness.  Psychiatric/Behavioral: The patient is nervous/anxious.   All other systems reviewed and are negative.   Length of Stay: 6  Current Medications: Scheduled Meds:  . aspirin EC  81 mg Oral Daily  . carvedilol  3.125 mg Oral BID WC  . chlorhexidine  15 mL Mouth Rinse BID  . furosemide  80 mg Intravenous Q6H  . heparin  5,000 Units Subcutaneous Q8H  . hydrALAZINE  25 mg Oral TID  . insulin aspart  0-9 Units Subcutaneous TID WC  . ipratropium-albuterol  3 mL Nebulization Q6H  . levothyroxine  25 mcg Oral QAC breakfast  . mouth rinse  15 mL  Mouth Rinse q12n4p  . metolazone  5 mg Oral Daily  . potassium chloride  40 mEq Oral BID    Continuous Infusions:   PRN Meds: acetaminophen, ALPRAZolam, diphenhydrAMINE, docusate sodium, haloperidol lactate, morphine CONCENTRATE, ondansetron (ZOFRAN) IV  Physical Exam  Constitutional: She is oriented to person, place, and time. She appears well-developed and well-nourished.  HENT:  Head: Normocephalic and atraumatic.  Cardiovascular: Normal rate, regular rhythm and intact distal pulses.   Pulmonary/Chest: Effort normal. No respiratory distress.  Abdominal: Soft. Bowel sounds are  normal.  Genitourinary:  Genitourinary Comments: Foley in place  Musculoskeletal: She exhibits edema.  Neurological: She is alert and oriented to person, place, and time.  Skin: Skin is warm and dry.  Psychiatric: She has a normal mood and affect. Her behavior is normal. Judgment and thought content normal.            Vital Signs: BP (!) 156/57 (BP Location: Left Arm)   Pulse 65   Temp 98.4 F (36.9 C) (Oral)   Resp 20   Ht 5' (1.524 m)   Wt 119.9 kg (264 lb 6.4 oz) Comment: scale a  SpO2 100%   BMI 51.64 kg/m  SpO2: SpO2: 100 % O2 Device: O2 Device: NRB O2 Flow Rate: O2 Flow Rate (L/min): 6 L/min  Intake/output summary:  Intake/Output Summary (Last 24 hours) at 02/02/16 1402 Last data filed at 02/02/16 1103  Gross per 24 hour  Intake              480 ml  Output             3350 ml  Net            -2870 ml   LBM: Last BM Date: 02/01/16 Baseline Weight: Weight: 129.3 kg (285 lb) Most recent weight: Weight: 119.9 kg (264 lb 6.4 oz) (scale a)       Palliative Assessment/Data: PPS: 40%    Flowsheet Rows   Flowsheet Row Most Recent Value  Intake Tab  Referral Department  Hospitalist  Unit at Time of Referral  ICU  Palliative Care Primary Diagnosis  Cardiac  Date Notified  01/31/16  Palliative Care Type  New Palliative care  Reason for referral  Clarify Goals of Care  Date of  Admission  01/27/16  # of days IP prior to Palliative referral  4  Clinical Assessment  Psychosocial & Spiritual Assessment  Palliative Care Outcomes      Patient Active Problem List   Diagnosis Date Noted  . Palliative care by specialist   . Goals of care, counseling/discussion   . COPD exacerbation (HCC)   . Obstructive sleep apnea   . Obesity hypoventilation syndrome (HCC)   . Chronic systolic CHF (congestive heart failure) (HCC)   . Dilated cardiomyopathy (HCC)   . Pulmonary hypertension   . Controlled diabetes mellitus type 2 with complications (HCC)   . Acute renal failure with acute tubular necrosis superimposed on stage 2 chronic kidney disease (HCC)   . CHF (congestive heart failure) (HCC) 01/27/2016  . Acute respiratory failure with hypoxia (HCC) 01/27/2016  . Chronic kidney disease 01/27/2016  . Diabetes mellitus, type II (HCC) 01/27/2016  . Leg wound, left 01/27/2016  . Anemia of chronic disease 01/27/2016  . Tobacco abuse 01/27/2016  . Diabetes mellitus with complication (HCC)   . Impetigo 05/29/2015  . Morbid obesity due to excess calories (HCC) 05/29/2015  . Acute on chronic diastolic congestive heart failure (HCC) 05/29/2015    Palliative Care Assessment & Plan   Patient Profile: 74 y.o. female  with past medical history of CHF (diastolic, EF 50-55%, LVH, systolic fx normal), CKD, chronic venous stasis, DM2, HTN, admitted on 01/27/2016 with SOB and hypoxia. She was found at home by her daughter, slumped over, EMS was called and she was 61% on RA. She required CPAP IV diuresis. Admitted for treatment of CHF exacerbation. Palliative medicine consulted for GOC.  Assessment/Recommendations/Plan   Morphine intensol 2.5mg  SL q2hr prn SOB, anxiety  Family meeting planned for 12/12  at 11am for further GOC and MOST form   Goals of Care and Additional Recommendations:  Limitations on Scope of Treatment: No Tracheostomy  Code Status:  DNR  Prognosis:   < 6  months  Discharge Planning:  Skilled Nursing Facility for rehab with Palliative care service follow-up  Care plan was discussed with patient and RN.  Thank you for allowing the Palliative Medicine Team to assist in the care of this patient.   Time In: 1300 Time Out: 1330 Total Time 30 minutes Prolonged Time Billed No      Greater than 50%  of this time was spent counseling and coordinating care related to the above assessment and plan.  Sue Roach, AGNP-C Palliative Medicine   Please contact Palliative Medicine Team phone at [phone] for questions and concerns.

## 2016-02-02 NOTE — Clinical Social Work Note (Addendum)
CSW met with patient and presented bed offers. Patient asked for a recommendation. CSW explained that a recommendation could not be given but CSW could tell her the most highly requested facilities. Patient stated that her daughter, [name] would be making the decision and gave CSW permission to call her. Patient stated that her daughter gets off work at 5:00 pm. Patient began desating while CSW in room. RN notified.  CSW called patient's daughter and left voicemail.  [name], [place] [phone]  11:11 am Patient's daughter returned call. CSW provided bed offers. [facility] is still pending. Discussed how insurance covers SNF and what will happen after she completes rehab. Discuss possibility of long-term care at SNF or ALF. CSW encouraged patient's daughter to get Medicaid for her mother. CSW emailed ALF list to patient's daughter for review.  [name], [place] [phone]  11:51 am Patient's son, [name], at bedside requesting to speak with CSW. Patient now on venturi mask. Discussed bed offers. First preference is [facility]. Patient's son will review SNF list with his spouse due to previous experience with getting his family into facilities. Patient's daughter forwarded emailed ALF list to patient's son. CSW provided contact card and patient's son provided his email address for future correspondences with his sister.  [name], [place]

## 2016-02-02 NOTE — Progress Notes (Signed)
Pt. O2 sats 78% on 6L [place]  while RN at bedside. Pt. In no distress. On call NP, Donnamarie PoagK. Kirby, paged to make aware. No new orders received at this time. Pts. O2 satuaration back up to 94% upon reassessment. RN will Cont to monitor pt. For changes in condition. Aarya Robinson, Cheryll DessertKaren Cherrell

## 2016-02-02 NOTE — Progress Notes (Signed)
Responded to bed alarm sounding in room with patient attempting to get OOB. Patient saturations 73% with good wave for at time of arrive, attempted to explain to patient not safe to get up at this time, and sounded nurse call button for assist. Son arrived to patient room and attempting to provide mother with RW to allow her to get up, explained to son that at this time, I could not allow her up out of bed without checking with her nurse as this patient was not familiar to me. Son visibly frustrated and said "shes the boss", I once again explained to patient that it was not safe for me to allow her to get up without speaking with her nurse. Patient stating that she was having difficulty breathing, son provided me with assistance in repositioning patient back in bed and elevating HOB. Patient saturations to 76% when nurse arrived to room, switched patient from 4 liters [place] to venti mask with nursing present. Left room with patient on venti mask and nursing at bedside.   Charlotte Crumbevon Tyisha Cressy, PT DPT [phone]

## 2016-02-02 NOTE — Progress Notes (Signed)
MD and respiratory therapist at bedside. Patient's oxygen saturation is 100% on non-rebreather. RT place patient back on 6L [place]. Will replace non-rebreather if oxygen saturation sustains low. Per MD, pt can maintain saturations between 88-92%.

## 2016-02-02 NOTE — Progress Notes (Signed)
Sue Roach ZOX:[id number] DOB: Mar 04, 1940 DOA: 01/27/2016 PCP: Kaleen Mask, MD  Brief narrative:  75 y/o ? Known Htn DM ty ii Prior tob abuse AOCD ckd stg iii pulm htn hypothyroidism Known LLE and R sacral wound Diastolic HF with ef 50-55% this admission  Admitted 12.6.17 with acute respiratory failure 2/2 to AECHF/OHSS initially was on Bipap Noted chr Venous stasis drmatitis   Past medical history-As per Problem list Chart reviewed as below-   Consultants:  palliative care  Psychiatry   Wound care  Procedures:    Antibiotics:     Subjective   Called by RN re sats low 80's Patient comfortable sleeping She seems to be a mouth breather  No cp no sob   Objective    Interim History:   Telemetry: nsr   Objective: Vitals:   02/01/16 1847 02/01/16 2150 02/01/16 2333 02/02/16 0615  BP: (!) 141/56 (!) 146/59 (!) 112/56 (!) 119/53  Pulse: 73 62 64 71  Resp: 20 (!) 22 18 18   Temp: 97.8 F (36.6 C) 97.4 F (36.3 C) 98 F (36.7 C) 98.4 F (36.9 C)  TempSrc: Oral Oral Oral Oral  SpO2: (!) 80% 91% 95% (!) 85%  Weight: 121.3 kg (267 lb 8 oz)   119.9 kg (264 lb 6.4 oz)  Height: 5' (1.524 m)       Intake/Output Summary (Last 24 hours) at 02/02/16 0730 Last data filed at 02/02/16 8119  Gross per 24 hour  Intake              480 ml  Output             3550 ml  Net            -3070 ml    Exam:  General: eomi thick neck, cannot appreciate jvd Cardiovascular: s1 s2 no m/r/g Respiratory: mild wheeze posterolaterally on R>L side Abdomen:  Obese soft nt nd no rebound no gaurd Skin venous stasis, grade 1-2 Le edema Neuro intact  Data Reviewed: Basic Metabolic Panel:  Recent Labs Lab 01/29/16 0141 01/30/16 0155 01/31/16 0259 02/01/16 0448 02/02/16 0309  NA 136 140 139 141 140  K 3.7 4.3 4.1 4.2 3.9  CL 97* 100* 96* 96* 92*  CO2 28 30 36* 34* 35*  GLUCOSE 125* 87 109* 101* 110*  BUN 43* 45* 52* 59* 59*  CREATININE  1.71* 1.70* 1.72* 1.81* 1.63*  CALCIUM 8.1* 8.5* 8.4* 8.7* 8.6*  MG 2.6*  --   --   --   --   PHOS  --   --  4.7*  --   --    Liver Function Tests:  Recent Labs Lab 01/30/16 0155 01/31/16 0259  AST 14*  --   ALT 16  --   ALKPHOS 83  --   BILITOT 0.9  --   PROT 6.0*  --   ALBUMIN 2.4* 2.4*   No results for input(s): LIPASE, AMYLASE in the last 168 hours. No results for input(s): AMMONIA in the last 168 hours. CBC:  Recent Labs Lab 01/27/16 0858 01/30/16 0155  WBC 9.1 7.7  NEUTROABS 6.9  --   HGB 9.4* 8.7*  HCT 31.5* 30.5*  MCV 82.9 85.0  PLT 399 328   Cardiac Enzymes:  Recent Labs Lab 01/27/16 1130 01/27/16 1630 01/27/16 2242  TROPONINI 0.04* 0.04* 0.03*   BNP: Invalid input(s): POCBNP CBG:  Recent Labs Lab 02/01/16 0730 02/01/16 1105 02/01/16 1559 02/01/16 2116 02/02/16 0615  GLUCAP 117* 194* 209*  129* 116*    Recent Results (from the past 240 hour(s))  MRSA PCR Screening     Status: None   Collection Time: 01/27/16 11:52 AM  Result Value Ref Range Status   MRSA by PCR NEGATIVE NEGATIVE Final    Comment:        The GeneXpert MRSA Assay (FDA approved for NASAL specimens only), is one component of a comprehensive MRSA colonization surveillance program. It is not intended to diagnose MRSA infection nor to guide or monitor treatment for MRSA infections.      Studies:              All Imaging reviewed and is as per above notation   Scheduled Meds: . aspirin EC  81 mg Oral Daily  . carvedilol  3.125 mg Oral BID WC  . chlorhexidine  15 mL Mouth Rinse BID  . furosemide  80 mg Intravenous Q6H  . heparin  5,000 Units Subcutaneous Q8H  . hydrALAZINE  25 mg Oral TID  . insulin aspart  0-9 Units Subcutaneous TID WC  . levothyroxine  25 mcg Oral QAC breakfast  . mouth rinse  15 mL Mouth Rinse q12n4p  . metolazone  5 mg Oral Daily  . potassium chloride  40 mEq Oral BID   Continuous Infusions:   Assessment/Plan:  Acute hypoxic respiratory  failure  Multifactorial:  Acute on chronic diastolic CHF, COPD, OSA/OHS- weaning oxygen as able Hasn't been tested prior for OSA.  Will use mask at night for sats below 88%, Rn informed  Acute exacerbation chronic diastolic CHF TTE unable to comment on diastolic function - physical exam consistent with significant volume overload - cont to diurese and follow - net negative ~7900cc thus far       [facility]   01/30/16 0400 01/31/16 0414 02/01/16 0534  Weight: 125.3 kg (276 lb 3.8 oz) 126.4 kg (278 lb 10.6 oz) 122.4 kg (269 lb 13.5 oz)    Acute COPD exacerbation - needs continued monitoring by Resp  - appears to be stabilizing to an extent -added back duonebs 12/12  OSA/OHS BiPAP QHS as patient will allow but note patient has refused already since admission  Acute on Chronic kidney disease stage 3 baseline Cr 1.5-1.6 - follow with ongoing diuresis - crt may be slowly trending upward - follow w/ onging diuresis - may soon limit our ability to continue diuresis   Last Labs    Recent Labs Lab 01/28/16 0359 01/29/16 0141 01/30/16 0155 01/31/16 0259 02/01/16 0448  CREATININE 1.64* 1.71* 1.70* 1.72* 1.81*      Tobacco abuse ongoing 1 pack per day habit - counseled on need to stop smoking   Pulmonary hypertension  HTN Blood pressure well controlled at this time Cont coreg 3.125 bid hydralazine 25 tid Amlodipine has been held  DM2 A1c 6.5 - CBG reasonably controlled 100 range- follow without change  On SSI now Resume amaryl 8 mg on d/c  Actos should be held going forward as should metformin  Anemia of chronic disease Hemoglobin 9 at baseline - stable at this time w/o evidence of acute blood loss   Last Labs    Recent Labs Lab 01/27/16 0858 01/30/16 0155  HGB 9.4* 8.7*     Hypothyroidism TSH elevated at 6.8 - Synthroid 25 g daily initiated this hospital stay   Left LE Was wearing an una boot on her LLE at admit - this has now been  removed, revealing no skin wound at this time -  WOC has evaluated    Agitation PRN Haldol  Deconditioning OT/PT consulted and following - plan is for rehab stay at SNF post d/c   Social Psychiatry has evaluated the patient and found her to be competent to make her own healthcare decisions - she will herself therefore have to consent to any disposition plans   No family present Expect will need some more diuresis and monitoring Keep on tele-d/c tele if no acute rythmn changes   Pleas KochJai Nikyla Navedo, MD  [facility] Pager [phone] 02/02/2016, 7:30 AM    LOS: 6 days

## 2016-02-02 NOTE — Progress Notes (Signed)
Physical Therapy Treatment Patient Details Name: Sue Roach MRN: [id number] DOB: [birth year] Today's Date: 02/02/2016    History of Present Illness Pt adm with Acute respiratory failure with hypoxia likely due to  Acute on chronic diastolic CHF exacerbation with undiagnosed underlying COPD. PMH - chf,  DM, HTN    PT Comments    Patient and family insistent on OOB to chair. PT and nsg assisted patient with OOB transfer activity. Patient with increased limitations in cognition and function today. Saturations on Vent mask stable but as soon as patient takes of mask patient desaturated into low 80s upper 70s. Unable to maintain saturations without supplemental O2. Patient with poor compliance and requires max cues for O2 compliance during session. Patient positioned OOB in chair and educated on elevation of LEs for edema control. Patient remains confused throughout session. Will continue to see and progress as tolerate.d  Follow Up Recommendations  SNF     Equipment Recommendations  None recommended by PT    Recommendations for Other Services       Precautions / Restrictions Precautions Precautions: Fall Precaution Comments: watch O2 saturations Restrictions Weight Bearing Restrictions: No    Mobility  Bed Mobility Overal bed mobility: Needs Assistance Bed Mobility: Supine to Sit     Supine to sit: Mod assist     General bed mobility comments: Moderate assist to elevate trunk to upright and rotate hips to EOB  Transfers Overall transfer level: Needs assistance Equipment used: 2 person hand held assist (face to face ) Transfers: Sit to/from Stand;Stand Pivot Transfers Sit to Stand: Min assist;+2 safety/equipment Stand pivot transfers: Min assist;+2 safety/equipment       General transfer comment: Min assist (+2) to power up to standing, difficulty initiating pivotal steps to chair  Ambulation/Gait                 Stairs            Wheelchair  Mobility    Modified Rankin (Stroke Patients Only)       Balance Overall balance assessment: Needs assistance Sitting-balance support: No upper extremity supported;Feet supported Sitting balance-Leahy Scale: Good     Standing balance support: Bilateral upper extremity supported Standing balance-Leahy Scale: Poor Standing balance comment: relaince on therapist support                    Cognition Arousal/Alertness: Awake/alert Behavior During Therapy: Impulsive Overall Cognitive Status: No family/caregiver present to determine baseline cognitive functioning Area of Impairment: Memory;Safety/judgement;Problem solving     Memory: Decreased short-term memory   Safety/Judgement: Decreased awareness of deficits;Decreased awareness of safety   Problem Solving: Slow processing;Decreased initiation;Requires verbal cues;Requires tactile cues General Comments: patient very impuslive and difficult to direct to task, poor compliance with OT and decreased overall safety awareness    Exercises      General Comments        Pertinent Vitals/Pain Pain Assessment: Faces Faces Pain Scale: Hurts even more Pain Location: back and chest Pain Descriptors / Indicators: Discomfort;Grimacing;Aching Pain Intervention(s): Monitored during session;Repositioned    Home Living                      Prior Function            PT Goals (current goals can now be found in the care plan section) Acute Rehab PT Goals Patient Stated Goal: to go home PT Goal Formulation: With patient Time For Goal Achievement: 02/11/16 Potential to Achieve Goals: Fair  Progress towards PT goals: Not progressing toward goals - comment (limited by desaturation and poor compliance with O2)    Frequency    Min 3X/week      PT Plan Current plan remains appropriate    Co-evaluation             End of Session Equipment Utilized During Treatment: Oxygen;Gait belt Activity Tolerance:  Patient tolerated treatment well Patient left: with call bell/phone within reach;in chair;with chair alarm set     Time: [id number] PT Time Calculation (min) (ACUTE ONLY): 19 min  Charges:  $Therapeutic Activity: 8-22 mins                    G Codes:      Fabio AsaDevon J Katria Botts 02/02/2016, 5:40 PM Charlotte Crumbevon Broderick Fonseca, PT DPT  [phone]

## 2016-02-02 NOTE — Care Management Important Message (Signed)
Important Message  Patient Details  Name: [name] MRN: [id number] Date of Birth: 1940-12-26   Medicare Important Message Given:  Yes    Tamira Ryland Stefan ChurchBratton 02/02/2016, 12:13 PM

## 2016-02-03 DIAGNOSIS — N170 Acute kidney failure with tubular necrosis: Secondary | ICD-10-CM

## 2016-02-03 DIAGNOSIS — I272 Pulmonary hypertension, unspecified: Secondary | ICD-10-CM

## 2016-02-03 DIAGNOSIS — N182 Chronic kidney disease, stage 2 (mild): Secondary | ICD-10-CM

## 2016-02-03 DIAGNOSIS — N183 Chronic kidney disease, stage 3 (moderate): Secondary | ICD-10-CM

## 2016-02-03 DIAGNOSIS — J441 Chronic obstructive pulmonary disease with (acute) exacerbation: Secondary | ICD-10-CM

## 2016-02-03 DIAGNOSIS — I5033 Acute on chronic diastolic (congestive) heart failure: Secondary | ICD-10-CM

## 2016-02-03 DIAGNOSIS — J9601 Acute respiratory failure with hypoxia: Secondary | ICD-10-CM

## 2016-02-03 DIAGNOSIS — Z720 Tobacco use: Secondary | ICD-10-CM

## 2016-02-03 DIAGNOSIS — G4733 Obstructive sleep apnea (adult) (pediatric): Secondary | ICD-10-CM

## 2016-02-03 DIAGNOSIS — E662 Morbid (severe) obesity with alveolar hypoventilation: Secondary | ICD-10-CM

## 2016-02-03 DIAGNOSIS — I509 Heart failure, unspecified: Secondary | ICD-10-CM

## 2016-02-03 LAB — BASIC METABOLIC PANEL
Anion gap: 14 (ref 5–15)
BUN: 57 mg/dL — AB (ref 4–21)
BUN: 57 mg/dL — AB (ref 6–20)
CALCIUM: 8.9 mg/dL (ref 8.9–10.3)
CO2: 38 mmol/L — ABNORMAL HIGH (ref 22–32)
CREATININE: 1.7 mg/dL — AB (ref 0.5–1.1)
Chloride: 89 mmol/L — ABNORMAL LOW (ref 101–111)
Creatinine, Ser: 1.71 mg/dL — ABNORMAL HIGH (ref 0.44–1.00)
GFR calc Af Amer: 32 mL/min — ABNORMAL LOW (ref >60)
GFR, EST NON AFRICAN AMERICAN: 28 mL/min — AB (ref >60)
GLUCOSE: 127 mg/dL — AB (ref 65–99)
Glucose: 127 mg/dL
Potassium: 3.8 mmol/L (ref 3.4–5.3)
Potassium: 3.8 mmol/L (ref 3.5–5.1)
SODIUM: 141 mmol/L (ref 137–147)
Sodium: 141 mmol/L (ref 135–145)

## 2016-02-03 LAB — GLUCOSE, CAPILLARY
Glucose-Capillary: 178 mg/dL — ABNORMAL HIGH (ref 65–99)
Glucose-Capillary: 168 mg/dL — ABNORMAL HIGH (ref 65–99)
Glucose-Capillary: 154 mg/dL — ABNORMAL HIGH (ref 65–99)

## 2016-02-03 MED ORDER — IPRATROPIUM-ALBUTEROL 0.5-2.5 (3) MG/3ML IN SOLN: 3.0000 mL | Freq: Four times a day (QID) | RESPIRATORY_TRACT | Status: DC | PRN

## 2016-02-03 MED ORDER — FUROSEMIDE 10 MG/ML IJ SOLN
80.0000 mg | Freq: Two times a day (BID) | INTRAMUSCULAR | Status: DC
  Administered 2016-02-03 – 2016-02-04 (×2): 80 mg via INTRAVENOUS
  Filled 2016-02-03 (×2): qty 8

## 2016-02-03 NOTE — Progress Notes (Signed)
Physical Therapy Treatment Patient Details Name: Sue Roach MRN: [id number] DOB: 04/26/1940 Today's Date: 02/03/2016    History of Present Illness Pt adm with Acute respiratory failure with hypoxia likely due to  Acute on chronic diastolic CHF exacerbation with undiagnosed underlying COPD. PMH - chf,  DM, HTN    PT Comments    Pt able to ambulate > 100' with RW and min/guard on 10L/min o2 with sats 93-97%.  Pt c/o chronic back pain with gait.  Pt needed cues for safety in room, but is not recepetive to education at times.  Follow Up Recommendations  SNF     Equipment Recommendations  None recommended by PT    Recommendations for Other Services       Precautions / Restrictions Precautions Precautions: Fall Precaution Comments: watch O2 saturations Restrictions Weight Bearing Restrictions: No    Mobility  Bed Mobility                  Transfers   Equipment used: None Transfers: Sit to/from Stand Sit to Stand: Supervision         General transfer comment: cues to not rely on rolling bedside table as it is on wheels.  Pt continued to lean on it.  Ambulation/Gait Ambulation/Gait assistance: Min guard Ambulation Distance (Feet): 120 Feet Assistive device: Rolling walker (2 wheeled) Gait Pattern/deviations: Step-through pattern;Decreased step length - right;Decreased step length - left;Trunk flexed Gait velocity: decr   General Gait Details: decreased step length with flexed posture.  Pt took 2 breaks where she leaned forward on RW.  Amb on 10 L/min with o2 sat 93-97%   Stairs            Wheelchair Mobility    Modified Rankin (Stroke Patients Only)       Balance Overall balance assessment: Needs assistance         Standing balance support: Single extremity supported Standing balance-Leahy Scale: Poor Standing balance comment: Pt requires at least 1 UE supported                    Cognition Arousal/Alertness:  Awake/alert Behavior During Therapy: Impulsive Overall Cognitive Status: Within Functional Limits for tasks assessed Area of Impairment: Safety/judgement         Safety/Judgement: Decreased awareness of deficits;Decreased awareness of safety   Problem Solving: Slow processing General Comments: Pt trying to take a cake off a tray in hallway cart.  Explained that they had been in other people's rooms. Pt reluctantly did not take a piece of cake off the tray.  Told NT, who was going to order her a piece of cake.    Exercises      General Comments General comments (skin integrity, edema, etc.): Upon return to the room, pt turned o2 on o2 canister down to 8 L/min with o2 at 97%.  Pt upset with PT that she would only relay information to nursing and not turn it down herself.  Explained that PT was not allowed to change o2 settings and will give info to nurse.      Pertinent Vitals/Pain Pain Assessment: Faces Faces Pain Scale: Hurts little more Pain Location: chronic back pain Pain Descriptors / Indicators: Grimacing Pain Intervention(s): Limited activity within patient's tolerance;Monitored during session;Repositioned    Home Living                      Prior Function            PT Goals (  current goals can now be found in the care plan section) Acute Rehab PT Goals Patient Stated Goal: to go home PT Goal Formulation: With patient Time For Goal Achievement: 02/11/16 Potential to Achieve Goals: Fair Progress towards PT goals: Progressing toward goals;Goals met and updated - see care plan    Frequency    Min 3X/week      PT Plan Current plan remains appropriate    Co-evaluation             End of Session Equipment Utilized During Treatment: Oxygen Activity Tolerance: Patient tolerated treatment well Patient left: with call bell/phone within reach;with chair alarm set;with family/visitor present     Time: 1312-1330 PT Time Calculation (min) (ACUTE  ONLY): 18 min  Charges:  $Gait Training: 8-22 mins                    G Codes:      Nathanyel Defenbaugh LUBECK 02/03/2016, 1:51 PM

## 2016-02-03 NOTE — Progress Notes (Addendum)
Daily Progress Note   Patient Name: Sue Roach       Date: 02/03/2016 DOB: 04/15/1940  Age: 75 y.o. MRN#: [id number] Attending Physician: Modena Jansky, MD Primary Care Physician: Leonard Downing, MD Admit Date: 01/27/2016  Reason for Consultation/Follow-up: Establishing goals of care and Non pain symptom management  Subjective: Met with patient and her son, Sam. Patient and son were very fixated on placement and there was difficulty discussing the big picture and overall goals of care. Patient mentioned that she would like to go live with her son, but her son is not able to provide that for her. They are considering SNF facilities. Patient was not willing to participate in a discussion regarding her overall prognosis and medical decisions which is very different from our last two conversations where she has been receptive and engaged. She states she does have advance directives and she believes her son [name] is designated as Economist. Sam is going to go and try and get a copy from her lawyer. Attempted to review MOST form, however, patient was unwilling and stated, "You just want to get my money". MOST form and advance directives pack given to Sam for review. Attempted multiple times to again explain palliative service concept, and advance  however, patient was adversarial and repeatedly questioned my intent, somewhat paranoid. Her son, Inocente Salles, was somewhat receptive, but again was primarily focused on placement. Patient stated she had no complaints symptomwise and also said she does not want to try morphine for SOB. I gave her support and told her the medication is just an option to help her quality of life and is not required.   Review of Systems  Constitutional: Positive for  malaise/fatigue.  Respiratory: Positive for cough and shortness of breath.   Genitourinary: Negative.   Neurological: Positive for weakness.  Psychiatric/Behavioral: The patient is nervous/anxious.   All other systems reviewed and are negative.   Length of Stay: 7  Current Medications: Scheduled Meds:  . aspirin EC  81 mg Oral Daily  . carvedilol  3.125 mg Oral BID WC  . chlorhexidine  15 mL Mouth Rinse BID  . furosemide  80 mg Intravenous Q6H  . heparin  5,000 Units Subcutaneous Q8H  . hydrALAZINE  25 mg Oral TID  . insulin aspart  0-9 Units Subcutaneous TID WC  .  ipratropium-albuterol  3 mL Nebulization Q6H  . levothyroxine  25 mcg Oral QAC breakfast  . mouth rinse  15 mL Mouth Rinse q12n4p  . metolazone  5 mg Oral Daily  . potassium chloride  40 mEq Oral BID    Continuous Infusions:   PRN Meds: acetaminophen, ALPRAZolam, diphenhydrAMINE, docusate sodium, haloperidol lactate, morphine CONCENTRATE, ondansetron (ZOFRAN) IV  Physical Exam  Constitutional: She is oriented to person, place, and time. She appears well-developed and well-nourished.  HENT:  Head: Normocephalic and atraumatic.  Cardiovascular: Normal rate, regular rhythm and intact distal pulses.   Pulmonary/Chest: Effort normal. No respiratory distress.  Abdominal: Soft. Bowel sounds are normal.  Musculoskeletal:  BLE edema  Neurological: She is alert and oriented to person, place, and time.  Skin: Skin is warm and dry.  BLE red with dry flaking skin  Psychiatric:  Thought content and judgement impaired            Vital Signs: BP 136/64 (BP Location: Left Arm)   Pulse 89   Temp 97.8 F (36.6 C) (Oral)   Resp 18   Ht 5' (1.524 m)   Wt 114.3 kg (251 lb 14.4 oz) Comment: scale a  SpO2 98%   BMI 49.20 kg/m  SpO2: SpO2: 98 % O2 Device: O2 Device: High Flow Nasal Cannula O2 Flow Rate: O2 Flow Rate (L/min): 12 L/min  Intake/output summary:   Intake/Output Summary (Last 24 hours) at 02/03/16  1400 Last data filed at 02/03/16 1058  Gross per 24 hour  Intake              240 ml  Output             3653 ml  Net            -3413 ml   LBM: Last BM Date: 02/01/16 Baseline Weight: Weight: 129.3 kg (285 lb) Most recent weight: Weight: 114.3 kg (251 lb 14.4 oz) (scale a)       Palliative Assessment/Data: PPS: 40%    Flowsheet Rows   Flowsheet Row Most Recent Value  Intake Tab  Referral Department  Hospitalist  Unit at Time of Referral  ICU  Palliative Care Primary Diagnosis  Cardiac  Date Notified  01/31/16  Palliative Care Type  New Palliative care  Reason for referral  Clarify Goals of Care  Date of Admission  01/27/16  # of days IP prior to Palliative referral  4  Clinical Assessment  Psychosocial & Spiritual Assessment  Palliative Care Outcomes      Patient Active Problem List   Diagnosis Date Noted  . CHF exacerbation ([place])   . Advance care planning   . Palliative care by specialist   . Goals of care, counseling/discussion   . COPD exacerbation ([place])   . Obstructive sleep apnea   . Obesity hypoventilation syndrome ([place])   . Chronic systolic CHF (congestive heart failure) ([place])   . Dilated cardiomyopathy ([place])   . Pulmonary hypertension   . Controlled diabetes mellitus type 2 with complications ([place])   . Acute renal failure with acute tubular necrosis superimposed on stage 2 chronic kidney disease ([place])   . CHF (congestive heart failure) ([place]) 01/27/2016  . Acute respiratory failure with hypoxia ([place]) 01/27/2016  . Chronic kidney disease 01/27/2016  . Diabetes mellitus, type II ([place]) 01/27/2016  . Leg wound, left 01/27/2016  . Anemia of chronic disease 01/27/2016  . Tobacco abuse 01/27/2016  . Diabetes mellitus with complication ([place])   . Impetigo 05/29/2015  .  Morbid obesity due to excess calories ([place]) 05/29/2015  . Acute on chronic diastolic congestive heart failure ([place]) 05/29/2015    Palliative Care Assessment & Plan   Patient Profile: 75  y.o. female  with past medical history of CHF (diastolic, EF 92-92%, LVH, systolic fx normal), CKD, chronic venous stasis, DM2, HTN, admitted on 01/27/2016 with SOB and hypoxia. She was found at home by her daughter, slumped over, EMS was called and she was 61% on RA. She required CPAP IV diuresis. Admitted for treatment of CHF exacerbation. Palliative medicine consulted for [place].  Assessment/Recommendations/Plan   Patient was in a different cognitive state today. I do wonder if there is some dementia related to her hypoxic states.   Attempted to screen for depression but she was not willing to engage in screening. She did admit to anxiety and "irritation", she declined any medication intervention.   ? Nicotine withdrawal as she was a pack a day smoker prior to admission. Recommend nicotine patch but will defer that to attending.  Recommend palliative service follow up at discharge for continued [place] and symptom management.   Copy of HCPOA and Healthcare Directives requested  Goals of Care and Additional Recommendations:  Limitations on Scope of Treatment: No Tracheostomy  Code Status:  DNR  Prognosis:   < 6 months d/t advance COPD, pt noncompliance, decreased level of functioning, likely long term facility placement  Discharge Planning:  [place] [facility] for rehab with Palliative care service follow-up  Care plan was discussed with patient and RN.  Thank you for allowing the Palliative Medicine Team to assist in the care of this patient.   Time In: 1130 Time Out: 1230 Total Time 60 minutes Prolonged Time Billed No      Greater than 50%  of this time was spent counseling and coordinating care related to the above assessment and plan.  Mariana Kaufman, AGNP-C Palliative Medicine   Please contact Palliative Medicine Team phone at [phone] for questions and concerns.

## 2016-02-03 NOTE — Progress Notes (Signed)
Sue Roach  ZOX:[id number] DOB: 1940/03/14 DOA: 01/27/2016 PCP: Kaleen Mask, MD    Brief Narrative:  75 y.o.F Hx Chronic Diastolic CHF, DM2, and HTN who presented with acute shortness of breath. When EMS arrived she was slumped over in a chair, minimally responsive. Her O2 sats were 61% on RA. EMS applied 4 L with improvement to 83%. Her level of consciousness improved.  She was started on CPAP, with immediate improvement of symptoms. The patient had a recent 10-12 pound weight gain, with decreased urine output accompanied by increased lower extremity edema.   Subjective: Patient has several complaints this morning. She wants to get out of bed without assistance although nursing reports that she is weak and a 2 person assist. She wants to have a shower. She refuses to wear continuous pulse oximetry even though she becomes sick easily hypoxic on oxygen with minimal activity and Foley catheter was discontinued after patient's insistence. Dyspnea has significantly improved and leg swelling have decreased.  Assessment & Plan:  Disposition The patient has multiple chronic incurable medical illnesses that are all impacting her respiratory status/dyspnea - per Dr. Vassie Moment discussion with her 12/10 she continues to desire ongoing medical therapy to maximize control of these issues - she and her son are made aware that control is the best we can hope for and that progression of these issues and therefore worsening of her difficult to control dyspnea is very likely - at this stage we plan to continue to treat the things that we can treat but recognize that she will progress at some point beyond the point of benefiting from ongoing medical care at which time a transition to comfort focused care will be most appropriate - Dr. Sharon Seller has asked palliative care to assist the patient in formulating very clear goals of care / a treatment plan for her skilled nursing facility in attempt to  prevent future hospitalizations and a smooth transition to full comfort care at such time that she is no longer responding to medical treatment  Acute hypoxic respiratory failure  Multifactorial:  Acute on chronic diastolic CHF, COPD, OSA/OHS - weaning oxygen as able. On 02/03/16, noted to be significantly hypoxic in the 70s with minimal activity while on oxygen 6 L/m and had to be titrated up to high flow oxygen 12 L/m. Patient refuses to wear continuous pulse oximetry.  Acute exacerbation chronic diastolic CHF TTE unable to comment on diastolic function - physical exam was consistent with significant volume overload - cont to diurese and follow - net negative ~ 12L thus far . Reduce Lasix. Filed Weights   02/01/16 1847 02/02/16 0615 02/03/16 0347  Weight: 121.3 kg (267 lb 8 oz) 119.9 kg (264 lb 6.4 oz) 114.3 kg (251 lb 14.4 oz)    Acute COPD exacerbation  Continue maximal medical therapy and follow - no wheezing at time of exam today - appears to be stabilizing to an extent. Patient upset because of scheduled bronchodilators and discussed with RT to change to as needed.  OSA/OHS BiPAP QHS as patient will allow but note patient has refused already since admission  Acute on Chronic kidney disease stage 3 baseline Cr 1.5-1.6 - follow with ongoing diuresis. Creatinine has remained stable in the 1.6-1.8 range over the last several days. May have to accept higher creatinine due to need for diuresis.  Recent Labs Lab 01/30/16 0155 01/31/16 0259 02/01/16 0448 02/02/16 0309 02/03/16 0426  CREATININE 1.70* 1.72* 1.81* 1.63* 1.71*    Tobacco abuse ongoing  1 pack per day habit - counseled on need to stop smoking . She stated that she quit smoking in April 2017.  Pulmonary hypertension  HTN Blood pressure well controlled at this time  DM2 A1c 6.5 - CBG reasonably controlled - follow without change   Anemia of chronic disease Hemoglobin 9 at baseline - stable at this time w/o  evidence of acute blood loss . Follow CBC in a.m.  Hypothyroidism TSH elevated at 6.8 - Synthroid 25 g daily initiated this hospital stay . Follow TFTs in 4-6 weeks as outpatient.  Left LE Was wearing an una boot on her LLE at admit - this has now been removed, revealing no skin wound at this time - WOC has evaluated    Agitation PRN Haldol. No further agitation reported.  Deconditioning OT/PT consulted and following - plan is for rehab stay at SNF post d/c   Social Psychiatry has evaluated the patient and found her to be competent to make her own healthcare decisions - she will herself therefore have to consent to any disposition plans  Paroxysmal atrial flutter - Noted on telemetry couple days back. Currently in sinus rhythm. Likely precipitated by acute respiratory status.CHA2DS2-VASC score: 6. Patient was on aspirin 81 MG daily prior to admission. Plan to discuss with patient regarding long-term anticoagulation.  DVT prophylaxis: SQ heparin Code Status: DNR / NO CODE Family Communication: No family at bedside. Disposition Plan: Admitted to stepdown. Transferred to telemetry. DC to SNF when stable.   Consultants:  Psychiatry  Palliative Care-plan to meet with patient and family on 02/03/16.  Procedures: 12/6 TTE - EF 50-55 % - RV moderately dilated - RA moderately dilated - PA peak pressure 36 mmHg Foley-discontinued 12/13.  Antimicrobials:  Kelfex 12/6 > 12/11  Objective:  Vitals:   02/02/16 2116 02/03/16 0056 02/03/16 0347 02/03/16 1208  BP:  (!) 151/55 (!) 118/46 136/64  Pulse:  63 62 89  Resp:   20 18  Temp:  97.9 F (36.6 C) 97.6 F (36.4 C) 97.8 F (36.6 C)  TempSrc:  Oral Oral Oral  SpO2: 95% 93% 93% 98%  Weight:   114.3 kg (251 lb 14.4 oz)   Height:         Intake/Output Summary (Last 24 hours) at 02/03/16 1351 Last data filed at 02/03/16 1058  Gross per 24 hour  Intake              240 ml  Output             3653 ml  Net            -3413  ml   Filed Weights   02/01/16 1847 02/02/16 0615 02/03/16 0347  Weight: 121.3 kg (267 lb 8 oz) 119.9 kg (264 lb 6.4 oz) 114.3 kg (251 lb 14.4 oz)    Examination: General: Moderately built and obese female patient lying comfortably propped up in bed. RN by bedside.  Lungs: Distant breath sounds. Diminished in the bases with few basal crackles but otherwise clear to auscultation. No wheezing or rhonchi. No increased work of breathing. Cardiovascular: Very distant heart sounds - no gallup or rub - no appreciable murmur. S1 and S2 heard, RRR. No JVD. Telemetry: Sinus rhythm/paroxysmal atrial flutter. Abdomen: Nontender, morbidly obese, soft, bowel sounds positive, no rebound, no ascites, no appreciable mass Extremities: 1+ B LE edmea w/ change of venous stasis dermatitis >improving.  CBC:  Recent Labs Lab 01/30/16 0155  WBC 7.7  HGB 8.7*  HCT  30.5*  MCV 85.0  PLT 328   Basic Metabolic Panel:  Recent Labs Lab 01/29/16 0141 01/30/16 0155 01/31/16 0259 02/01/16 0448 02/02/16 0309 02/03/16 0426  NA 136 140 139 141 140 141  K 3.7 4.3 4.1 4.2 3.9 3.8  CL 97* 100* 96* 96* 92* 89*  CO2 28 30 36* 34* 35* 38*  GLUCOSE 125* 87 109* 101* 110* 127*  BUN 43* 45* 52* 59* 59* 57*  CREATININE 1.71* 1.70* 1.72* 1.81* 1.63* 1.71*  CALCIUM 8.1* 8.5* 8.4* 8.7* 8.6* 8.9  MG 2.6*  --   --   --   --   --   PHOS  --   --  4.7*  --   --   --    GFR: Estimated Creatinine Clearance: 32.3 mL/min (by C-G formula based on SCr of 1.71 mg/dL (H)).  Liver Function Tests:  Recent Labs Lab 01/30/16 0155 01/31/16 0259  AST 14*  --   ALT 16  --   ALKPHOS 83  --   BILITOT 0.9  --   PROT 6.0*  --   ALBUMIN 2.4* 2.4*    Cardiac Enzymes:  Recent Labs Lab 01/27/16 1630 01/27/16 2242  TROPONINI 0.04* 0.03*    HbA1C: Hgb A1c MFr Bld  Date/Time Value Ref Range Status  01/27/2016 11:30 AM 6.5 (H) 4.8 - 5.6 % Final    Comment:    (NOTE)         Pre-diabetes: 5.7 - 6.4         Diabetes:  >6.4         Glycemic control for adults with diabetes: <7.0     CBG:  Recent Labs Lab 02/02/16 0615 02/02/16 1137 02/02/16 1640 02/02/16 2106 02/03/16 1131  GLUCAP 116* 135* 214* 113* 178*    Recent Results (from the past 240 hour(s))  MRSA PCR Screening     Status: None   Collection Time: 01/27/16 11:52 AM  Result Value Ref Range Status   MRSA by PCR NEGATIVE NEGATIVE Final    Comment:        The GeneXpert MRSA Assay (FDA approved for NASAL specimens only), is one component of a comprehensive MRSA colonization surveillance program. It is not intended to diagnose MRSA infection nor to guide or monitor treatment for MRSA infections.      Scheduled Meds: . aspirin EC  81 mg Oral Daily  . carvedilol  3.125 mg Oral BID WC  . chlorhexidine  15 mL Mouth Rinse BID  . furosemide  80 mg Intravenous Q6H  . heparin  5,000 Units Subcutaneous Q8H  . hydrALAZINE  25 mg Oral TID  . insulin aspart  0-9 Units Subcutaneous TID WC  . ipratropium-albuterol  3 mL Nebulization Q6H  . levothyroxine  25 mcg Oral QAC breakfast  . mouth rinse  15 mL Mouth Rinse q12n4p  . metolazone  5 mg Oral Daily  . potassium chloride  40 mEq Oral BID    LOS: 7 days   Shenequa Howse, MD, FACP, FHM. [facility] Pager [phone]  If 7PM-7AM, please contact night-coverage [email or web address] Password [email or web address] 02/03/2016, 2:03 PM

## 2016-02-03 NOTE — Progress Notes (Signed)
Pt refusing to wear continuous pulse ox. Pt allowed me to spot check pt sp02 at 96% on 10L sitting up in chair son at the bedside. MD called pt in the room.

## 2016-02-03 NOTE — Progress Notes (Signed)
Pt. insistant on removal of Foley catheter after multiple attempts of education about why the foley is in place. Md made aware. Of continuation of IV lasix and desaturations on 02. Will continue to monitor

## 2016-02-03 NOTE — Progress Notes (Signed)
Cardiology Consult    Patient ID: Sue Roach MRN: [id number], DOB/AGE: 1940/04/15   Admit date: 01/27/2016 Date of Consult: 02/03/2016  Primary Physician: Kaleen MaskELKINS,WILSON OLIVER, MD Reason for Consult: CHF Primary Cardiologist: New, Dr. Herbie BaltimoreHarding  Requesting Provider: Dr. Waymon AmatoHongalgi   Patient Profile    [name] is a 75 year old female with a past medical history of chronic diastolic CHF, DM, HTN and chronic kidney disease. She presented with SOB, and required Bipap initially. Treated for acute diastolic CHF, cardiology was consulted for further management.   History of Present Illness   Ms. Roach was found slumped over in her recliner, minimally responsive with O2 saturations of 61%. Her daughter reported that the patient had recently had a 10-12 pound weight gain.   She was taking 80mg  Lasix TID outpatient. She was weighing herself at home and did notice a 20 pound weight gain. Echo shows EF of 50-55%, did not comment on diastolic dysfunctions.   She sleeps on one pillow at home, mostly flat. She has had some orthopnea while here. She denies PND. At home she eats high salt foods, but does not eat out often.    Past Medical History   Past Medical History:  Diagnosis Date  . CHF (congestive heart failure) (HCC)   . Diabetes mellitus without complication (HCC)   . Hypertension   . Renal disorder     History reviewed. No pertinent surgical history.   Allergies  Allergies  Allergen Reactions  . Sulfa Antibiotics     Inpatient Medications    . aspirin EC  81 mg Oral Daily  . carvedilol  3.125 mg Oral BID WC  . chlorhexidine  15 mL Mouth Rinse BID  . furosemide  80 mg Intravenous Q12H  . heparin  5,000 Units Subcutaneous Q8H  . hydrALAZINE  25 mg Oral TID  . insulin aspart  0-9 Units Subcutaneous TID WC  . levothyroxine  25 mcg Oral QAC breakfast  . mouth rinse  15 mL Mouth Rinse q12n4p  . metolazone  5 mg Oral Daily  . potassium chloride   40 mEq Oral BID    Family History    History reviewed. No pertinent family history.  Social History    Social History   Social History  . Marital status: Widowed    Spouse name: N/A  . Number of children: N/A  . Years of education: N/A   Occupational History  . Not on file.   Social History Main Topics  . Smoking status: Current Every Day Smoker    Packs/day: 1.00    Types: Cigarettes  . Smokeless tobacco: Never Used  . Alcohol use No  . Drug use: No  . Sexual activity: Not on file   Other Topics Concern  . Not on file   Social History Narrative  . No narrative on file     Review of Systems    General:  No chills, fever, night sweats or weight changes.  Cardiovascular:  No chest pain, + dyspnea on exertion, edema, orthopnea, palpitations, paroxysmal nocturnal dyspnea. Dermatological: No rash, lesions/masses Respiratory: No cough, dyspnea Urologic: No hematuria, dysuria Abdominal:   No nausea, vomiting, diarrhea, bright red blood per rectum, melena, or hematemesis Neurologic:  No visual changes, wkns, changes in mental status. All other systems reviewed and are otherwise negative except as noted above.  Physical Exam    Blood pressure 136/64, pulse 89, temperature 97.8 F (36.6 C), temperature source Oral, resp. rate 18, height 5' (  1.524 m), weight 251 lb 14.4 oz (114.3 kg), SpO2 98 %.  General: Pleasant, NAD Psych: Normal affect. Neuro: Alert and oriented X 3. Moves all extremities spontaneously. HEENT: Normal  Neck: Supple without bruits or JVD. Lungs:  Resp regular and unlabored, diminished in bases  Heart: RRR no s3, s4, or murmurs. Abdomen: Soft, non-tender, non-distended, BS + x 4.  Extremities: No clubbing, cyanosis. 2+ pretibial edema. DP/PT/Radials 2+ and equal bilaterally.  Labs    Troponin (Point of Care Test) No results for input(s): TROPIPOC in the last 72 hours. No results for input(s): CKTOTAL, CKMB, TROPONINI in the last 72 hours. Lab  Results  Component Value Date   WBC 7.7 01/30/2016   HGB 8.7 (L) 01/30/2016   HCT 30.5 (L) 01/30/2016   MCV 85.0 01/30/2016   PLT 328 01/30/2016    Recent Labs Lab 01/30/16 0155  02/03/16 0426  NA 140  < > 141  K 4.3  < > 3.8  CL 100*  < > 89*  CO2 30  < > 38*  BUN 45*  < > 57*  CREATININE 1.70*  < > 1.71*  CALCIUM 8.5*  < > 8.9  PROT 6.0*  --   --   BILITOT 0.9  --   --   ALKPHOS 83  --   --   ALT 16  --   --   AST 14*  --   --   GLUCOSE 87  < > 127*  < > = values in this interval not displayed. No results found for: CHOL, HDL, LDLCALC, TRIG No results found for: DDIMER   Radiology Studies    Dg Chest 2 View  Result Date: 01/28/2016 CLINICAL DATA:  CHF exacerbation EXAM: CHEST  2 VIEW COMPARISON:  Portable chest x-ray of 01/27/2016 FINDINGS: The probable CHF previously has diminished and aeration has improved. Moderate cardiomegaly remains. There may be very minimal pulmonary vascular congestion remaining. No effusion is seen. No bony abnormality is noted. IMPRESSION: Improvement in the edema pattern with perhaps mild residual pulmonary vascular congestion. Stable cardiomegaly Electronically Signed   By: Dwyane Dee M.D.   On: 01/28/2016 09:51   Dg Chest Port 1 View  Result Date: 01/27/2016 CLINICAL DATA:  Short of breath, low oxygen saturation, smoking history EXAM: PORTABLE CHEST 1 VIEW COMPARISON:  Chest x-ray of 01/24/2014 FINDINGS: The lungs are poorly aerated. There is underpenetration of the lung bases, and the left lung base in particular cannot be well evaluated. Pneumonia or atelectasis cannot be excluded at the left lung base. There is cardiomegaly present with mild pulmonary vascular congestion. No bony abnormality is seen. IMPRESSION: Poor inspiration film with poor penetration. Probable mild CHF with cardiomegaly and pulmonary vascular congestion. Cannot evaluate the left lung base. Electronically Signed   By: Dwyane Dee M.D.   On: 01/27/2016 09:12    EKG &  Cardiac Imaging    EKG: NSR, RBBB   Echocardiogram: 01/27/16 Study Conclusions  - Left ventricle: Wall thickness was increased in a pattern of   moderate LVH. Systolic function was normal. The estimated   ejection fraction was in the range of 50% to 55%. - Aortic valve: Mildly calcified annulus. - Right ventricle: The cavity size was moderately dilated. Systolic   function was moderately reduced. - Right atrium: The atrium was mildly to moderately dilated. - Pulmonary arteries: Systolic pressure was mildly increased. PA   peak pressure: 36 mm Hg (S).  Impressions:  - Technically difficult study.  Assessment & Plan    1. Acute on chronic diastolic CHF: Patient has diuresed 12.8 L, BUN is 57, clinically looks euvolemic. Can transition to po lasix, would do 80 BID. She needs to avoid high salt foods.   She is very fixated on notifying Dr. Hyman HopesWebb about her lasix doing. She is quite defensive and somewhat paranoid.   2. Suspected OSA: Likely has sleep apnea however is unwilling to participate in a sleep study.   3. HTN: Well controlled.     Signed, Little IshikawaErin E Smith, NP 02/03/2016, 4:20 PM Pager: [phone]   I have seen, examined and evaluated the patient this PM along with Suzzette RighterErin Smith, NP.  After reviewing all the available data and chart, we discussed the patients laboratory, study & physical findings as well as symptoms in detail. I agree with her findings, examination as well as impression recommendations as per our discussion.    Very frustrating patient interview. She did not seem at all interested in hearing the reasons for us being there she simply questioned if anything decision when able to be run by her doctors. She was not agitated and are recommendations. She presented with clear volume overload has been aggressively diuresed and still has evidence of volume overload.  She takes 80 mg 3 times a day Lasix at home, but notes that her weight has gone up medically prior  to her admission. Her EF is preserved, she clearly probably has right-sided failure from obesity hypoventilation syndrome. She declines having evaluation for OSA although this was documented as being present during her time in the ICU.  Her PCP is been managing her diuretic, but she is quick to point out that Dr. Hyman HopesWebb from nephrology also needs to notify any changes that should be made.  Based on her lack of desire to have any involvement with our consultation, I don't think that she needs cardiology follow-up. This can be managed by her PCP and nephrologist unless she chooses to come to cardiologist.  She may have some component of diastolic heart failure and probably also right-sided heart failure from her lung disease.  I still think she probably needs at least another day or 2 of IV Lasix, I agree with reducing the interval of IV dosing. - She has pretty significant lower extremity edema still present, that she blames on having her feet down all day long. We recommended keeping her feet elevated, but she gave an excuse about the chair being uncomfortable.  As for her discharge regimen, I would determine her dry weight. Have her records that her home. She would then continue with her 2-3 80 mg tablets of Lasix a day with taking the additional dose for weight gain greater than 3 pounds 1 day. If there is no benefit from that additional dose of Lasix, this is [place] suggested we would have is that she have a when necessary dose of Zaroxolyn to take before a dose of Lasix.  I also recommend that it would be better for her to take 120 mg twice a day then 80 mg 3 times a day in order to ensure adequate urine output.  Also recommended strongly consider the use of compression stockings at home and elevating her feet.  We will check again tomorrow to see how she's doing on the current dose of IV Lasix.  Based on her BUN and creatinine levels, she looks like she may be reaching a point of intravascular  depletion, still has total body fluid overload. -- I suspect  that we will need to accept a mild increase in creatinine level in order to have her fully diuresed.  Please by her request, run any opinions about medication regimens by her nephrologist prior to ordering her discharge medications. While I'm happy to be of assistance during the patient's hospitalization, I respectfully request that she not be scheduled to follow-up with me in the clinic as our interaction during this interview was less than favorable.     Bryan Lemma, M.D., M.S. Interventional Cardiologist   Pager # [phone] Phone # [phone] [address] [place], [place] [address]

## 2016-02-03 NOTE — Progress Notes (Signed)
Pt wanting to take a shower and wash hair. Educated pt on the reason we can not to that at this point in her care but offered bed bath and shower cap. Pt not satisfied. Pt c/o of itching to hips and back  explained to pt I will order cotton sheets and bathe and apply powder per request.

## 2016-02-03 NOTE — Clinical Social Work Note (Signed)
[facility] Despina HiddenWhitney Stroud. She stated that they are currently full but should have something by Friday if patient discharges then. CSW made her aware that patient was having a palliative meeting today and then needed more diuresis.   Charlynn CourtSarah Crescent Gotham, CSW [phone]

## 2016-02-03 NOTE — Progress Notes (Signed)
Occupational Therapy Treatment Patient Details Name: Sue Roach MRN: [id number] DOB: 1940/10/20 Today's Date: 02/03/2016    History of present illness Pt adm with Acute respiratory failure with hypoxia likely due to  Acute on chronic diastolic CHF exacerbation with undiagnosed underlying COPD. PMH - chf,  DM, HTN   OT comments  Pt progressing. Completed functional mobility with minguard A. Ablet to ambulate @ 120 ft with RW on 8L with desat to 85 with rebound to 91 with pursed lip breathing. Mod A for LB ADL. Pt attempting to take food off "food cart". Explained to pt she could not take food off the cart that other people had eaten and pt stated "well they never brought me my cake". Will continue to follow.  Follow Up Recommendations  SNF    Equipment Recommendations  None recommended by OT    Recommendations for Other Services      Precautions / Restrictions Precautions Precautions: Fall Precaution Comments: watch O2 saturations       Mobility Bed Mobility               General bed mobility comments: OOB in chair  Transfers Overall transfer level: Needs assistance   Transfers: Sit to/from Stand;Stand Pivot Transfers Sit to Stand: Supervision Stand pivot transfers: Supervision       General transfer comment: stood impulsively before turning off chair alarm.    Balance     Sitting balance-Leahy Scale: Good       Standing balance-Leahy Scale: Poor                     ADL Overall ADL's : Needs assistance/impaired     Grooming: Set up;Wash/dry hands;Wash/dry face;Brushing hair               Lower Body Dressing: Moderate assistance Lower Body Dressing Details (indicate cue type and reason): unable to reach feet             Functional mobility during ADLs: Min guard;Rolling walker (ambulated @ 120 ft. desat to 83.rebound to 91 )        Vision                     Perception     Praxis      Cognition    Behavior During Therapy: Impulsive Overall Cognitive Status: Impaired/Different from baseline Area of Impairment: Safety/judgement     Memory: Decreased short-term memory    Safety/Judgement: Decreased awareness of safety;Decreased awareness of deficits   Problem Solving: Slow processing General Comments: Pt again tried to take food off food cart. Explained that she could not take food off the cart adn pt said "well they neve brought me my piece of cake" (unsure of baseline)    Extremity/Trunk Assessment     BUE generalized weakness          Exercises     Shoulder Instructions       General Comments      Pertinent Vitals/ Pain       Pain Assessment: Faces Faces Pain Scale: Hurts little more Pain Location: chronic back pain Pain Descriptors / Indicators: Grimacing Pain Intervention(s): Limited activity within patient's tolerance  O2 desat to 85 on 8 L with ambulation  Home Living  Prior Functioning/Environment              Frequency  Min 2X/week        Progress Toward Goals  OT Goals(current goals can now be found in the care plan section)  Progress towards OT goals: Progressing toward goals  Acute Rehab OT Goals Patient Stated Goal: to go home OT Goal Formulation: With patient Time For Goal Achievement: 02/11/16 Potential to Achieve Goals: Fair ADL Goals Pt Will Perform Grooming: with set-up;sitting Pt Will Perform Upper Body Bathing: with supervision;with set-up;sitting Pt Will Perform Lower Body Bathing: sit to/from stand;with mod assist Pt Will Perform Upper Body Dressing: with set-up;sitting Pt Will Transfer to Toilet: with mod assist;stand pivot transfer;bedside commode Pt Will Perform Toileting - Clothing Manipulation and hygiene: with mod assist;sit to/from stand  Plan Discharge plan remains appropriate    Co-evaluation                 End of Session Equipment Utilized  During Treatment: Gait belt;Oxygen;Rolling walker   Activity Tolerance Patient tolerated treatment well   Patient Left in chair;with call bell/phone within reach;with family/visitor present   Nurse Communication Mobility status        Time: [id number] OT Time Calculation (min): 20 min  Charges: OT General Charges $OT Visit: 1 Procedure OT Treatments $Self Care/Home Management : 8-22 mins  Kallan Bischoff,HILLARY 02/03/2016, 6:18 PM   [facility] Zurri Rudden, OT/L  [phone] 02/03/2016

## 2016-02-03 NOTE — Progress Notes (Signed)
PT Cancellation Note  Patient Details Name: Sue Roach MRN: [id number] DOB: 04-25-1940   Cancelled Treatment:    Reason Eval/Treat Not Completed: Patient at procedure or test/unavailable. Pt meeting with palliative.  Will check back as schedule permits.   Taiven Greenley LUBECK 02/03/2016, 11:28 AM

## 2016-02-04 ENCOUNTER — Inpatient Hospital Stay (HOSPITAL_COMMUNITY): Payer: Medicare Other

## 2016-02-04 DIAGNOSIS — I5032 Chronic diastolic (congestive) heart failure: Secondary | ICD-10-CM

## 2016-02-04 LAB — BASIC METABOLIC PANEL
Anion gap: 12 (ref 5–15)
BUN: 57 mg/dL — AB (ref 4–21)
BUN: 57 mg/dL — ABNORMAL HIGH (ref 6–20)
CHLORIDE: 85 mmol/L — AB (ref 101–111)
CO2: 42 mmol/L — AB (ref 22–32)
CREATININE: 1.8 mg/dL — AB (ref 0.5–1.1)
Calcium: 8.5 mg/dL — ABNORMAL LOW (ref 8.9–10.3)
Creatinine, Ser: 1.75 mg/dL — ABNORMAL HIGH (ref 0.44–1.00)
GFR calc non Af Amer: 27 mL/min — ABNORMAL LOW (ref >60)
GFR, EST AFRICAN AMERICAN: 31 mL/min — AB (ref >60)
GLUCOSE: 123 mg/dL
Glucose, Bld: 123 mg/dL — ABNORMAL HIGH (ref 65–99)
POTASSIUM: 3.7 mmol/L (ref 3.5–5.1)
SODIUM: 139 mmol/L (ref 137–147)
SODIUM: 139 mmol/L (ref 135–145)

## 2016-02-04 LAB — CBC
HEMATOCRIT: 31.4 % — AB (ref 36.0–46.0)
HEMOGLOBIN: 9.1 g/dL — AB (ref 12.0–15.0)
MCH: 23.8 pg — AB (ref 26.0–34.0)
MCHC: 29 g/dL — ABNORMAL LOW (ref 30.0–36.0)
MCV: 82.2 fL (ref 78.0–100.0)
Platelets: 354 10*3/uL (ref 150–400)
RBC: 3.82 MIL/uL — AB (ref 3.87–5.11)
RDW: 19 % — ABNORMAL HIGH (ref 11.5–15.5)
WBC: 9.8 10*3/uL (ref 4.0–10.5)

## 2016-02-04 LAB — GLUCOSE, CAPILLARY
GLUCOSE-CAPILLARY: 137 mg/dL — AB (ref 65–99)
GLUCOSE-CAPILLARY: 132 mg/dL — AB (ref 65–99)
GLUCOSE-CAPILLARY: 242 mg/dL — AB (ref 65–99)
Glucose-Capillary: 110 mg/dL — ABNORMAL HIGH (ref 65–99)

## 2016-02-04 LAB — CBC AND DIFFERENTIAL: WBC: 9.8 10*3/mL

## 2016-02-04 MED ORDER — FUROSEMIDE 80 MG PO TABS
120.0000 mg | ORAL_TABLET | Freq: Two times a day (BID) | ORAL | Status: DC
  Administered 2016-02-04 – 2016-02-05 (×3): 120 mg via ORAL
  Filled 2016-02-04 (×3): qty 1

## 2016-02-04 NOTE — Progress Notes (Signed)
Physical Therapy Treatment Patient Details Name: Sue Roach MRN: [id number] DOB: 09/15/1940 Today's Date: 02/04/2016    History of Present Illness Pt adm with Acute respiratory failure with hypoxia likely due to  Acute on chronic diastolic CHF exacerbation with undiagnosed underlying COPD. PMH - chf,  DM, HTN    PT Comments    Pt continues to make steady progress.  Follow Up Recommendations  SNF     Equipment Recommendations  None recommended by PT    Recommendations for Other Services       Precautions / Restrictions Precautions Precautions: Fall Restrictions Weight Bearing Restrictions: No    Mobility  Bed Mobility               General bed mobility comments: Pt up in chair  Transfers Overall transfer level: Needs assistance Equipment used: Rolling walker (2 wheeled);None Transfers: Sit to/from [facility] Sit to Stand: Supervision Stand pivot transfers: Min guard       General transfer comment: Verbal cues for hand placement. Transferred to/from bsc without assistive device  Ambulation/Gait Ambulation/Gait assistance: Min guard Ambulation Distance (Feet): 140 Feet Assistive device: Rolling walker (2 wheeled) Gait Pattern/deviations: Step-through pattern;Decreased step length - right;Decreased step length - left;Trunk flexed Gait velocity: decr Gait velocity interpretation: <1.8 ft/sec, indicative of risk for recurrent falls General Gait Details: Assist for balance and safety. Pt took two standing rest breaks with forearms propped on handles of walker.  Amb on 6L of O2.   Stairs            Wheelchair Mobility    Modified Rankin (Stroke Patients Only)       Balance Overall balance assessment: Needs assistance Sitting-balance support: No upper extremity supported Sitting balance-Leahy Scale: Good     Standing balance support: Single extremity supported Standing balance-Leahy Scale: Poor Standing balance  comment: Pt requires at least 1 UE supported                    Cognition Arousal/Alertness: Awake/alert Behavior During Therapy: Impulsive Overall Cognitive Status: No family/caregiver present to determine baseline cognitive functioning Area of Impairment: Safety/judgement     Memory: Decreased short-term memory   Safety/Judgement: Decreased awareness of deficits;Decreased awareness of safety   Problem Solving: Slow processing General Comments: When initially in to see pt she was perseverating on taking a shower and unwilling to amb until after her shower. When seen later pt perseverating on finding her nail clippers    Exercises      General Comments        Pertinent Vitals/Pain Pain Assessment: No/denies pain    Home Living                      Prior Function            PT Goals (current goals can now be found in the care plan section) Acute Rehab PT Goals Patient Stated Goal: to go home PT Goal Formulation: With patient Time For Goal Achievement: 02/11/16 Potential to Achieve Goals: Good Progress towards PT goals: Goals met and updated - see care plan    Frequency    Min 2X/week      PT Plan Current plan remains appropriate;Frequency needs to be updated    Co-evaluation             End of Session Equipment Utilized During Treatment: Oxygen Activity Tolerance: Patient tolerated treatment well Patient left: in chair;with call bell/phone within reach;with chair alarm set  Time: 9794-8016 PT Time Calculation (min) (ACUTE ONLY): 19 min  Charges:  $Gait Training: 8-22 mins                    G Codes:      Shary Decamp [place]ok February 26, 2016, 3:47 PM [employer] PT [phone]

## 2016-02-04 NOTE — Progress Notes (Signed)
Patient Name: Sue MacleodCarolyn Roach Date of Encounter: 02/04/2016  Primary Cardiologist: Dr. Kristeen MissHarding   Hospital Problem List     Active Problems:   CHF (congestive heart failure) (HCC)   Acute respiratory failure with hypoxia (HCC)   Chronic kidney disease   Diabetes mellitus, type II (HCC)   Leg wound, left   Anemia of chronic disease   Tobacco abuse   Diabetes mellitus with complication (HCC)   COPD exacerbation (HCC)   Obstructive sleep apnea   Obesity hypoventilation syndrome (HCC)   Chronic systolic CHF (congestive heart failure) (HCC)   Dilated cardiomyopathy (HCC)   Pulmonary hypertension   Controlled diabetes mellitus type 2 with complications (HCC)   Acute renal failure with acute tubular necrosis superimposed on stage 2 chronic kidney disease (HCC)   Palliative care by specialist   Goals of care, counseling/discussion   CHF exacerbation (HCC)   Advance care planning     Subjective   Anxious, frustrated. Refusing all interventions.   Inpatient Medications    Scheduled Meds: . aspirin EC  81 mg Oral Daily  . carvedilol  3.125 mg Oral BID WC  . chlorhexidine  15 mL Mouth Rinse BID  . furosemide  80 mg Intravenous Q12H  . heparin  5,000 Units Subcutaneous Q8H  . hydrALAZINE  25 mg Oral TID  . insulin aspart  0-9 Units Subcutaneous TID WC  . levothyroxine  25 mcg Oral QAC breakfast  . mouth rinse  15 mL Mouth Rinse q12n4p  . metolazone  5 mg Oral Daily  . potassium chloride  40 mEq Oral BID   Continuous Infusions:  PRN Meds: acetaminophen, ALPRAZolam, diphenhydrAMINE, docusate sodium, haloperidol lactate, ipratropium-albuterol, morphine CONCENTRATE, ondansetron (ZOFRAN) IV   Vital Signs    Vitals:   02/03/16 0347 02/03/16 1208 02/03/16 1952 02/04/16 0436  BP: (!) 118/46 136/64 (!) 137/50 (!) 120/52  Pulse: 62 89 60 66  Resp: 20 18 20 20   Temp: 97.6 F (36.4 C) 97.8 F (36.6 C) 97.7 F (36.5 C) 97.7 F (36.5 C)  TempSrc: Oral Oral Oral Oral    SpO2: 93% 98% 93% 95%  Weight: 251 lb 14.4 oz (114.3 kg)   256 lb 12.8 oz (116.5 kg)  Height:        Intake/Output Summary (Last 24 hours) at 02/04/16 0708 Last data filed at 02/04/16 0644  Gross per 24 hour  Intake              600 ml  Output             3610 ml  Net            -3010 ml   Filed Weights   02/02/16 0615 02/03/16 0347 02/04/16 0436  Weight: 264 lb 6.4 oz (119.9 kg) 251 lb 14.4 oz (114.3 kg) 256 lb 12.8 oz (116.5 kg)    Physical Exam  General: Defensive, NAD Psych: Defensive.  Neuro: Alert and oriented X 3. Moves all extremities spontaneously. HEENT: Normal           Neck: Supple without bruits or JVD. Lungs:  Resp regular and unlabored, diminished in bases  Heart: RRR no s3, s4, or murmurs. Abdomen: Soft, non-tender, non-distended, BS + x 4.  Extremities: No clubbing, cyanosis. 2+ pretibial edema. DP/PT/Radials 2+ and equal bilaterally   Labs    CBC  Recent Labs  02/04/16 0221  WBC 9.8  HGB 9.1*  HCT 31.4*  MCV 82.2  PLT 354   Basic Metabolic Panel  Recent Labs  02/03/16 0426 02/04/16 0221  NA 141 139  K 3.8 3.7  CL 89* 85*  CO2 38* 42*  GLUCOSE 127* 123*  BUN 57* 57*  CREATININE 1.71* 1.75*  CALCIUM 8.9 8.5*     Telemetry    NSR - Personally Reviewed  ECG    NSR, RBBB- Personally Reviewed  Radiology    No results found.  Cardiac Studies   Echocardiogram: 01/27/16 Study Conclusions  - Left ventricle: Wall thickness was increased in a pattern of moderate LVH. Systolic function was normal. The estimated ejection fraction was in the range of 50% to 55%. - Aortic valve: Mildly calcified annulus. - Right ventricle: The cavity size was moderately dilated. Systolic function was moderately reduced. - Right atrium: The atrium was mildly to moderately dilated. - Pulmonary arteries: Systolic pressure was mildly increased. PA peak pressure: 36 mm Hg (S).  Impressions:  - Technically difficult study.  Patient  Profile     [name] is a 75 year old female with a past medical history of chronic diastolic CHF, DM, HTN and chronic kidney disease. She presented with SOB, and required Bipap initially. Treated for acute diastolic CHF, cardiology was consulted for further management.    Assessment & Plan    1. Acute on chronic diastolic CHF: Patient has diuresed 12.8 L, BUN is 57, clinically looks euvolemic.   Would transition to 120mg  Lasix po BID.   She needs to use compression stockings and elevate her feet.   She is very fixated on notifying Dr. Hyman HopesWebb about her lasix doing. She is quite defensive and somewhat paranoid.   2. Suspected OSA: Likely has sleep apnea however is unwilling to participate in a sleep study.   3. HTN: Well controlled.    Signed, Little IshikawaErin E Smith, NP  02/04/2016, 7:08 AM    I briefly saw the patient today. She does not seem to have much interest in cardiology input. I reviewed the chart and all available data and discussed the patient). I agree with her note.  We are to tell if she has at her baseline, because it is very difficult history from her.  Recommendations were to go to 120 mg twice a day Lasix, compression stockings and foot elevation is noted. This recommendation was as cussed with Dr. Hyman HopesWebb who agreed. Could consider additional when necessary Zaroxolyn versus additional dose of Lasix -- however Dr. Hyman HopesWebb would prefer just use an additional diuretic first proximal backup to be prescribing outpatient.  . Would continue to discuss sliding scale for a second dose of Lasix based on increased weight of >3 pounds in a day and/or worsening dyspnea. His blood pressure was good. Would continue meds.    Bryan Lemmaavid Jayli Fogleman, M.D., M.S. Interventional Cardiologist   Pager # [phone] Phone # [phone] [address] [place], [place] [address]

## 2016-02-04 NOTE — Progress Notes (Signed)
Patient refuses BIPAP use due to it making her feel "like she is in a closet." RT informed her to let us know if she changes her mind at any time.

## 2016-02-04 NOTE — Progress Notes (Signed)
Sue Roach  JOI:[id number] DOB: 1940/11/07 DOA: 01/27/2016 PCP: Leonard Downing, MD    Brief Narrative:  75 y.o.F Hx Chronic Diastolic CHF, DM2, and HTN who presented with acute shortness of breath. When EMS arrived she was slumped over in a chair, minimally responsive. Her O2 sats were 61% on RA. EMS applied 4 L with improvement to 83%. Her level of consciousness improved.  She was started on CPAP, with immediate improvement of symptoms. The patient had a recent 10-12 pound weight gain, with decreased urine output accompanied by increased lower extremity edema.   Subjective: Seemed to be in a better mood this morning. Denied dyspnea even with ambulation to the bathroom. Leg swellings decreasing but worse when sitting up. Wanted to have a shower. No other complaints reported.  Assessment & Plan:  Disposition The patient has multiple chronic incurable medical illnesses that are all impacting her respiratory status/dyspnea - per Dr. Garen Lah discussion with her 12/10 she continues to desire ongoing medical therapy to maximize control of these issues - she and her son are made aware that control is the best we can hope for and that progression of these issues and therefore worsening of her difficult to control dyspnea is very likely - at this stage we plan to continue to treat the things that we can treat but recognize that she will progress at some point beyond the point of benefiting from ongoing medical care at which time a transition to comfort focused care will be most appropriate - Dr. Thereasa Solo has asked palliative care to assist the patient in formulating very clear goals of care / a treatment plan for her skilled nursing facility in attempt to prevent future hospitalizations and a smooth transition to full comfort care at such time that she is no longer responding to medical treatment. Palliative care met with patient on 12/13 and patient did not seem to want to  engage.  Acute hypoxic respiratory failure  Multifactorial:  Acute on chronic diastolic CHF, COPD, OSA/OHS - weaning oxygen as able. On 02/03/16, noted to be significantly hypoxic in the 70s with minimal activity while on oxygen 6 L/m and had to be titrated up to high flow oxygen 12 L/m. Patient refuses to wear continuous pulse oximetry.  Acute exacerbation chronic diastolic CHF TTE unable to comment on diastolic function - physical exam was consistent with significant volume overload - cont to diurese and follow - net negative ~ 14L thus far . Cardiology input appreciated. Diuretics have been changed to Lasix 120 MG twice a day, compression stockings for leg edema and foot elevation. The diuretic recommendations were discussed with Dr. Justin Mend who agreed. Also consider additional Zaroxolyn when necessary. Filed Weights   02/02/16 0615 02/03/16 0347 02/04/16 0436  Weight: 119.9 kg (264 lb 6.4 oz) 114.3 kg (251 lb 14.4 oz) 116.5 kg (256 lb 12.8 oz)    Acute COPD exacerbation  Continue maximal medical therapy and follow - no wheezing at time of exam today - appears to be stabilizing to an extent. Patient upset because of scheduled bronchodilators and discussed with RT to change to as needed.  OSA/OHS BiPAP QHS as patient will allow but note patient has refused already since admission  Acute on Chronic kidney disease stage 3 baseline Cr 1.5-1.6 - follow with ongoing diuresis. Creatinine has remained stable in the 1.6-1.8 range over the last several days. May have to accept higher creatinine due to need for diuresis.  Recent Labs Lab 01/31/16 0259 02/01/16 0448 02/02/16 0309  02/03/16 0426 02/04/16 0221  CREATININE 1.72* 1.81* 1.63* 1.71* 1.75*    Tobacco abuse ongoing 1 pack per day habit - counseled on need to stop smoking . She stated that she quit smoking in April 2017.  Pulmonary hypertension  HTN Blood pressure well controlled at this time  DM2 A1c 6.5 - CBG reasonably  controlled - follow without change   Anemia of chronic disease Hemoglobin 9 at baseline - stable at this time w/o evidence of acute blood loss . Follow CBC in a.m.  Hypothyroidism TSH elevated at 6.8 - Synthroid 25 g daily initiated this hospital stay . Follow TFTs in 4-6 weeks as outpatient.  Left LE Was wearing an una boot on her LLE at admit - this has now been removed, revealing no skin wound at this time - WOC has evaluated    Agitation PRN Haldol. No further agitation reported.  Deconditioning OT/PT consulted and following - plan is for rehab stay at SNF post d/c   Social Psychiatry has evaluated the patient and found her to be competent to make her own healthcare decisions - she will herself therefore have to consent to any disposition plans  Paroxysmal atrial flutter - Noted on telemetry couple days back. Currently in sinus rhythm. Likely precipitated by acute respiratory status.CHA2DS2-VASC score: 6. Patient was on aspirin 81 MG daily prior to admission. Discussed with patient regarding long-term anticoagulation-patient not interested.  DVT prophylaxis: SQ heparin Code Status: DNR / NO CODE Family Communication: No family at bedside. Disposition Plan: Admitted to stepdown. Transferred to telemetry. DC to SNF possibly in the next 1-2 days   Consultants:  Psychiatry  Palliative Care  Procedures: 12/6 TTE - EF 50-55 % - RV moderately dilated - RA moderately dilated - PA peak pressure 36 mmHg Foley-discontinued 12/13.  Antimicrobials:  Kelfex 12/6 > 12/11  Objective:  Vitals:   02/03/16 1208 02/03/16 1952 02/04/16 0436 02/04/16 1219  BP: 136/64 (!) 137/50 (!) 120/52 (!) 112/55  Pulse: 89 60 66 (!) 57  Resp: 18 20 20 18   Temp: 97.8 F (36.6 C) 97.7 F (36.5 C) 97.7 F (36.5 C) 97.5 F (36.4 C)  TempSrc: Oral Oral Oral Oral  SpO2: 98% 93% 95% 97%  Weight:   116.5 kg (256 lb 12.8 oz)   Height:         Intake/Output Summary (Last 24 hours) at 02/04/16  1539 Last data filed at 02/04/16 1255  Gross per 24 hour  Intake              535 ml  Output             1360 ml  Net             -825 ml   Filed Weights   02/02/16 0615 02/03/16 0347 02/04/16 0436  Weight: 119.9 kg (264 lb 6.4 oz) 114.3 kg (251 lb 14.4 oz) 116.5 kg (256 lb 12.8 oz)    Examination: General: Moderately built and obese female patient Sitting up comfortably in chair this morning eating breakfast. Lungs: Distant breath sounds. Diminished in the bases with few basal crackles but otherwise clear to auscultation. No wheezing or rhonchi. No increased work of breathing. Cardiovascular: Very distant heart sounds - no gallup or rub - no appreciable murmur. S1 and S2 heard, RRR. No JVD. Telemetry: Sinus rhythm/paroxysmal atrial flutter. Abdomen: Nontender, morbidly obese, soft, bowel sounds positive, no rebound, no ascites, no appreciable mass Extremities: 1+ B LE edmea w/ change of venous stasis  dermatitis >improving.  CBC:  Recent Labs Lab 01/30/16 0155 02/04/16 0221  WBC 7.7 9.8  HGB 8.7* 9.1*  HCT 30.5* 31.4*  MCV 85.0 82.2  PLT 328 329   Basic Metabolic Panel:  Recent Labs Lab 01/29/16 0141  01/31/16 0259 02/01/16 0448 02/02/16 0309 02/03/16 0426 02/04/16 0221  NA 136  < > 139 141 140 141 139  K 3.7  < > 4.1 4.2 3.9 3.8 3.7  CL 97*  < > 96* 96* 92* 89* 85*  CO2 28  < > 36* 34* 35* 38* 42*  GLUCOSE 125*  < > 109* 101* 110* 127* 123*  BUN 43*  < > 52* 59* 59* 57* 57*  CREATININE 1.71*  < > 1.72* 1.81* 1.63* 1.71* 1.75*  CALCIUM 8.1*  < > 8.4* 8.7* 8.6* 8.9 8.5*  MG 2.6*  --   --   --   --   --   --   PHOS  --   --  4.7*  --   --   --   --   < > = values in this interval not displayed. GFR: Estimated Creatinine Clearance: 31.9 mL/min (by C-G formula based on SCr of 1.75 mg/dL (H)).  Liver Function Tests:  Recent Labs Lab 01/30/16 0155 01/31/16 0259  AST 14*  --   ALT 16  --   ALKPHOS 83  --   BILITOT 0.9  --   PROT 6.0*  --   ALBUMIN 2.4* 2.4*     Cardiac Enzymes: No results for input(s): CKTOTAL, CKMB, CKMBINDEX, TROPONINI in the last 168 hours.  HbA1C: Hgb A1c MFr Bld  Date/Time Value Ref Range Status  01/27/2016 11:30 AM 6.5 (H) 4.8 - 5.6 % Final    Comment:    (NOTE)         Pre-diabetes: 5.7 - 6.4         Diabetes: >6.4         Glycemic control for adults with diabetes: <7.0     CBG:  Recent Labs Lab 02/03/16 1131 02/03/16 1617 02/03/16 2103 02/04/16 0542 02/04/16 1217  GLUCAP 178* 168* 154* 137* 132*    Recent Results (from the past 240 hour(s))  MRSA PCR Screening     Status: None   Collection Time: 01/27/16 11:52 AM  Result Value Ref Range Status   MRSA by PCR NEGATIVE NEGATIVE Final    Comment:        The GeneXpert MRSA Assay (FDA approved for NASAL specimens only), is one component of a comprehensive MRSA colonization surveillance program. It is not intended to diagnose MRSA infection nor to guide or monitor treatment for MRSA infections.      Scheduled Meds: . aspirin EC  81 mg Oral Daily  . carvedilol  3.125 mg Oral BID WC  . chlorhexidine  15 mL Mouth Rinse BID  . furosemide  120 mg Oral BID  . heparin  5,000 Units Subcutaneous Q8H  . hydrALAZINE  25 mg Oral TID  . insulin aspart  0-9 Units Subcutaneous TID WC  . levothyroxine  25 mcg Oral QAC breakfast  . mouth rinse  15 mL Mouth Rinse q12n4p  . metolazone  5 mg Oral Daily  . potassium chloride  40 mEq Oral BID    LOS: 8 days   Sue Huizinga, MD, FACP, FHM. [facility] Pager [phone]  If 7PM-7AM, please contact night-coverage [email or web address] Password [facility] 02/04/2016, 3:39 PM

## 2016-02-04 NOTE — Clinical Social Work Note (Addendum)
CSW received voicemail from patient's son, Peyton NajjarLarry, this morning regarding interest in [facility]. CSW spoke with patient and answered all questions she had about [facility]. [facility] continues to be first preference with [facility] as a backup. CSW will call [facility] in the morning to see if they have a bed available for the weekend.  Charlynn CourtSarah Savannah Erbe, CSW [phone]  2:25 pm CSW updated patient's son, Peyton NajjarLarry.  Charlynn CourtSarah Tatyana Biber, CSW [phone]

## 2016-02-05 LAB — BASIC METABOLIC PANEL
ANION GAP: 13 (ref 5–15)
BUN: 58 mg/dL — ABNORMAL HIGH (ref 6–20)
CALCIUM: 8.7 mg/dL — AB (ref 8.9–10.3)
CHLORIDE: 83 mmol/L — AB (ref 101–111)
CO2: 43 mmol/L — AB (ref 22–32)
Creatinine, Ser: 1.84 mg/dL — ABNORMAL HIGH (ref 0.44–1.00)
GFR calc Af Amer: 30 mL/min — ABNORMAL LOW (ref >60)
GFR calc non Af Amer: 26 mL/min — ABNORMAL LOW (ref >60)
Glucose, Bld: 116 mg/dL — ABNORMAL HIGH (ref 65–99)
POTASSIUM: 3.6 mmol/L (ref 3.5–5.1)
Sodium: 139 mmol/L (ref 135–145)

## 2016-02-05 LAB — GLUCOSE, CAPILLARY
Glucose-Capillary: 132 mg/dL — ABNORMAL HIGH (ref 65–99)
Glucose-Capillary: 142 mg/dL — ABNORMAL HIGH (ref 65–99)

## 2016-02-05 LAB — BASIC METABOLIC PANEL WITH GFR
BUN: 58 mg/dL — AB (ref 4–21)
Creatinine: 1.8 mg/dL — AB (ref 0.5–1.1)
Glucose: 116 mg/dL
Sodium: 139 mmol/L (ref 137–147)

## 2016-02-05 MED ORDER — CARVEDILOL 3.125 MG PO TABS: 3.1250 mg | ORAL_TABLET | Freq: Two times a day (BID) | ORAL | Status: DC

## 2016-02-05 MED ORDER — INSULIN ASPART 100 UNIT/ML ~~LOC~~ SOLN: 0.0000 [IU] | Freq: Three times a day (TID) | SUBCUTANEOUS | Status: DC

## 2016-02-05 MED ORDER — FUROSEMIDE 80 MG PO TABS: 120.0000 mg | ORAL_TABLET | Freq: Two times a day (BID) | ORAL | Status: DC

## 2016-02-05 MED ORDER — POTASSIUM CHLORIDE CRYS ER 20 MEQ PO TBCR: 40.0000 meq | EXTENDED_RELEASE_TABLET | Freq: Two times a day (BID) | ORAL | Status: DC

## 2016-02-05 MED ORDER — ACETAMINOPHEN 325 MG PO TABS: 650.0000 mg | ORAL_TABLET | ORAL | Status: DC | PRN

## 2016-02-05 MED ORDER — LEVOTHYROXINE SODIUM 25 MCG PO TABS: 25.0000 ug | ORAL_TABLET | Freq: Every day | ORAL | Status: DC

## 2016-02-05 NOTE — Progress Notes (Signed)
Attempted report to [facility]. Was asked to call back in 10 minutes.  Will attempt again.

## 2016-02-05 NOTE — Clinical Social Work Note (Signed)
CSW facilitated patient discharge including contacting patient family and facility to confirm patient discharge plans. Clinical information faxed to facility and family agreeable with plan. CSW arranged ambulance transport via [facility] to [facility]. RN to call report prior to discharge ([phone]).  CSW will sign off for now as social work intervention is no longer needed. Please consult us again if new needs arise.  Jasminemarie Sherrard, CSW [phone]  

## 2016-02-05 NOTE — Consult Note (Signed)
WOC Nurse wound consult note Reason for Consult: new ulceration RLE Patient admitted with LE venous stasis dx, she did have Unna's boot at the time of admission on the LLE and since she has refused to have it replaced.  I discussed with her that this new ulcer on the RLE is related to the same problem and that the way we manage venous stasis is use of compression therapy. She continues to refuse the application of compression to either LE.  I even offered lower level compression 12-15mmHG with dry boot (kerlix and coban) she also refused this as well.  Wound type: venous stasis ulcer RLE  Measurement: 3.0cm x 2.0cm x 0.1cm  Wound bed:100% yellow, a bit slimy  Drainage (amount, consistency, odor) yellow, slight odor Periwound: intact, venous dermatitis Dressing procedure/placement/frequency: Add silver hydrofiber to the ulcer for antimicrobial effect and exudate management, top with foam. Change every 3 days.  Discussed POC with patient and bedside nurse.  Re consult if needed, will not follow at this time. Thanks  Cadi Rhinehart M.D.[employer] MSN, RN,CWOCN, CNS [phone])

## 2016-02-05 NOTE — Discharge Summary (Signed)
Physician Discharge Summary  Sue Roach XKG:[id number] DOB: May 03, 1940  PCP: Leonard Downing, MD  Admit date: 01/27/2016 Discharge date: 02/05/2016  Recommendations for Outpatient Follow-up:  1. M.D. at SNF in 3 days with repeat labs (CBC & BMP). 2. Dr. Edrick Oh, Nephrology in 1 week. SNF to arrange. 3. Recommend palliative care consultation & follow-up at SNF. 4. Consider outpatient Cardiology consultation if patient is agreeable. 5. Recommend repeating TSH in 4-6 weeks. 6. Dr. Claris Gower, PCP upon discharge from SNF.  Home Health: None Equipment/Devices: None    Discharge Condition: Improved and stable.  CODE STATUS: DO NOT RESUSCITATE  Diet recommendation: Heart healthy and diabetic diet.  Discharge Diagnoses:  Active Problems:   CHF (congestive heart failure) (HCC)   Acute respiratory failure with hypoxia (HCC)   Chronic kidney disease   Diabetes mellitus, type II (HCC)   Leg wound, left   Anemia of chronic disease   Tobacco abuse   Diabetes mellitus with complication (HCC)   COPD exacerbation (HCC)   Obstructive sleep apnea   Obesity hypoventilation syndrome (HCC)   Chronic systolic CHF (congestive heart failure) (HCC)   Dilated cardiomyopathy ([place])   Pulmonary hypertension   Controlled diabetes mellitus type 2 with complications (HCC)   Acute renal failure with acute tubular necrosis superimposed on stage 2 chronic kidney disease ([place])   Palliative care by specialist   Goals of care, counseling/discussion   CHF exacerbation ([place])   Advance care planning   Brief/Interim Summary: 75 y.o.F Hx Chronic Diastolic CHF, DM2, and HTN who presented with acute shortness of breath. When EMS arrived she was slumped over in a chair, minimally responsive. Her O2 sats were 61% on RA. EMS applied 4 L with improvement to 83%. Her level of consciousness improved.  She was started on CPAP, with immediate improvement of symptoms. The patient had a recent 10-12  pound weight gain, with decreased urine output accompanied by increased lower extremity edema.   Assessment & Plan:  Disposition The patient has multiple chronic incurable medical illnesses that are all impacting her respiratory status/dyspnea - per Dr. Garen Lah discussion with her 12/10 she continues to desire ongoing medical therapy to maximize control of these issues - she and her son were made aware that control is the best we can hope for and that progression of these issues and therefore worsening of her difficult to control dyspnea is very likely - at this stage we plan to continue to treat the things that we can treat but recognize that she will progress at some point beyond the point of benefiting from ongoing medical care at which time a transition to comfort focused care will be most appropriate - palliative care was consulted to assist the patient in formulating very clear goals of care / a treatment plan for her skilled nursing facility in attempt to prevent future hospitalizations and a smooth transition to full comfort care at such time that she is no longer responding to medical treatment. Palliative care met with patient on 12/13 and patient did not seem to want to engage.  Acute hypoxic respiratory failure  Multifactorial:  Acute on chronic diastolic CHF, COPD, OSA/OHS- weaning oxygen as able. On 02/03/16, noted to be significantly hypoxic in the 70s with minimal activity while on oxygen 6 L/m and had to be titrated up to high flow oxygen 12 L/m. Patient refuses to wear continuous pulse oximetry. Patient absolutely refuses to wear BiPAP at night or going forward at [facility]. She remains on oxygen via  nasal cannula at 5-6 L/m which can be titrated to oxygen saturation of 89-92 percent. This has been stable for the last couple of days.  Acute exacerbation chronic diastolic CHF TTE unable to comment on diastolic function - physical exam was consistent with significant volume overload -  cont to diurese and follow - net negative ~ 14L thus far . Cardiology input appreciated. Diuretics have been changed to Lasix 120 MG twice a day, compression stockings for leg edema and foot elevation. May consider increasing Lasix to 160 MG 2 times a day if she becomes symptomatic provided renal functions are stable. The diuretic recommendations were discussed with Dr. Justin Mend who agreed. Also consider additional Zaroxolyn when necessary. Patient may follow up with her primary nephrologist home she defers most of her medical decisions to, regarding adjustment of diuretic dose.      Filed Weights   02/02/16 0615 02/03/16 0347 02/04/16 0436  Weight: 119.9 kg (264 lb 6.4 oz) 114.3 kg (251 lb 14.4 oz) 116.5 kg (256 lb 12.8 oz)    Acute COPD exacerbation Resolved.  OSA/OHS BiPAP -patient noncompliant with this for several days and does not wish to continue.  Acute on Chronic kidney disease stage 3 baseline Cr 1.5-1.6 - follow with ongoing diuresis. Creatinine has remained stable in the 1.6-1.8 range over the last several days. May have to accept higher creatinine due to need for diuresis.  Last Labs    Recent Labs Lab 01/31/16 0259 02/01/16 0448 02/02/16 0309 02/03/16 0426 02/04/16 0221  CREATININE 1.72* 1.81* 1.63* 1.71* 1.75*      Tobacco abuse ongoing 1 pack per day habit - counseled on need to stop smoking . She stated that she quit smoking in April 2017.  Pulmonary hypertension  HTN Blood pressure well controlled at this time  DM2 A1c 6.5 - CBG reasonably controlled - follow without change . Continue SSI at discharge and discontinued oral hypoglycemics given renal insufficiency and volume overload.  Anemia of chronic disease Hemoglobin 9 at baseline - stable at this time w/o evidence of acute blood loss .   Hypothyroidism TSH elevated at 6.8 - Synthroid 25 g daily initiated this hospital stay . Follow TFTs in 4-6 weeks as outpatient.  Right lower extremity  ulceration  Wound care evaluated on 12/15 and please follow their recommendations. Patient has history of lower extremity venous stasis.  Agitation Resolved.  Deconditioning Patient will be discharged to SNF for rehabilitation.  Social Psychiatry has evaluated the patient and found her to be competent to make her own healthcare decisions - she will herself therefore have to consent to any disposition plans  Paroxysmal atrial flutter - Likely precipitated by acute respiratory status.CHA2DS2-VASC score: 6. Continue aspirin and carvedilol. Discussed with patient regarding long-term anticoagulation-patient not interested. She is at high risk for noncompliance. Discussed with cardiology who agreed no anticoagulation at this time. May consider increasing carvedilol as blood pressure and heart rate tolerate.  Discharge Instructions  Discharge Instructions    (HEART FAILURE PATIENTS) Call MD:  Anytime you have any of the following symptoms: 1) 3 pound weight gain in 24 hours or 5 pounds in 1 week 2) shortness of breath, with or without a dry hacking cough 3) swelling in the hands, feet or stomach 4) if you have to sleep on extra pillows at night in order to breathe.    Complete by:  As directed    Call MD for:  difficulty breathing, headache or visual disturbances    Complete by:  As directed    Call MD for:  extreme fatigue    Complete by:  As directed    Call MD for:  persistant dizziness or light-headedness    Complete by:  As directed    Diet - low sodium heart healthy    Complete by:  As directed    Diet Carb Modified    Complete by:  As directed    Increase activity slowly    Complete by:  As directed        Medication List    STOP taking these medications   amLODipine 10 MG tablet Commonly known as:  NORVASC   glimepiride 4 MG tablet Commonly known as:  AMARYL   metFORMIN 1000 MG tablet Commonly known as:  GLUCOPHAGE   pioglitazone 45 MG tablet Commonly known as:   ACTOS   traMADol 50 MG tablet Commonly known as:  ULTRAM     TAKE these medications   acetaminophen 325 MG tablet Commonly known as:  TYLENOL Take 2 tablets (650 mg total) by mouth every 4 (four) hours as needed for mild pain, moderate pain, fever or headache.   aspirin EC 81 MG tablet Take 81 mg by mouth daily.   carvedilol 3.125 MG tablet Commonly known as:  COREG Take 1 tablet (3.125 mg total) by mouth 2 (two) times daily with a meal.   docusate sodium 100 MG capsule Commonly known as:  COLACE Take 100 mg by mouth 2 (two) times daily as needed for constipation.   furosemide 80 MG tablet Commonly known as:  LASIX Take 1.5 tablets (120 mg total) by mouth 2 (two) times daily. What changed:  how much to take  when to take this  additional instructions   hydrALAZINE 25 MG tablet Commonly known as:  APRESOLINE Take 25 mg by mouth 3 (three) times daily.   insulin aspart 100 UNIT/ML injection Commonly known as:  novoLOG Inject 0-9 Units into the skin 3 (three) times daily with meals. CBG < 70: implement hypoglycemia protocol CBG 70 - 120: 0 units CBG 121 - 150: 1 unit CBG 151 - 200: 2 units CBG 201 - 250: 3 units CBG 251 - 300: 5 units CBG 301 - 350: 7 units CBG 351 - 400: 9 units CBG > 400: call MD.   levothyroxine 25 MCG tablet Commonly known as:  SYNTHROID, LEVOTHROID Take 1 tablet (25 mcg total) by mouth daily before breakfast. Start taking on:  02/06/2016   potassium chloride SA 20 MEQ tablet Commonly known as:  K-DUR,KLOR-CON Take 2 tablets (40 mEq total) by mouth 2 (two) times daily.       Contact information for follow-up providers    M.D. at SNF. Schedule an appointment as soon as possible for a visit in 3 day(s).   Why:  To be seen with repeat labs (CBC & BMP).       Sherril Croon, MD. Schedule an appointment as soon as possible for a visit in 1 week(s).   Specialty:  Nephrology Contact information: [place] [address] [phone]         Leonard Downing, MD Follow up.   Specialty:  Family Medicine Why:  Upon discharge from SNF. Contact information: [place] [address] [phone]            Contact information for after-discharge care    Destination    [facility] SNF Follow up.   Specialty:  [place]  information: [place] [phone]                 Allergies  Allergen Reactions  . Sulfa Antibiotics     Procedures/Studies: Dg Chest 2 View  Result Date: 01/28/2016 CLINICAL DATA:  CHF exacerbation EXAM: CHEST  2 VIEW COMPARISON:  Portable chest x-ray of 01/27/2016 FINDINGS: The probable CHF previously has diminished and aeration has improved. Moderate cardiomegaly remains. There may be very minimal pulmonary vascular congestion remaining. No effusion is seen. No bony abnormality is noted. IMPRESSION: Improvement in the edema pattern with perhaps mild residual pulmonary vascular congestion. Stable cardiomegaly Electronically Signed   By: Ivar Drape M.D.   On: 01/28/2016 09:51   Dg Chest Port 1 View  Result Date: 02/04/2016 CLINICAL DATA:  Acute on chronic systolic congestive heart failure. EXAM: PORTABLE CHEST 1 VIEW COMPARISON:  01/28/2016 FINDINGS: Cardiomegaly again demonstrated. Pulmonary venous hypertension. Mild interstitial edema. No infiltrate for collapse. Possible small amount of pleural fluid on the left. IMPRESSION: Persistent findings of congestive heart failure. Cardiomegaly. Mild interstitial edema. Electronically Signed   By: Nelson Chimes M.D.   On: 02/04/2016 10:01   Dg Chest Port 1 View  Result Date: 01/27/2016 CLINICAL DATA:  Short of breath, low oxygen saturation, smoking history EXAM: PORTABLE CHEST 1 VIEW COMPARISON:  Chest x-ray of 01/24/2014 FINDINGS: The lungs are poorly aerated. There is underpenetration of the lung bases, and the left lung base in particular  cannot be well evaluated. Pneumonia or atelectasis cannot be excluded at the left lung base. There is cardiomegaly present with mild pulmonary vascular congestion. No bony abnormality is seen. IMPRESSION: Poor inspiration film with poor penetration. Probable mild CHF with cardiomegaly and pulmonary vascular congestion. Cannot evaluate the left lung base. Electronically Signed   By: Ivar Drape M.D.   On: 01/27/2016 09:12  2-D echo 01/27/16: Study Conclusions  - Left ventricle: Wall thickness was increased in a pattern of   moderate LVH. Systolic function was normal. The estimated   ejection fraction was in the range of 50% to 55%. - Aortic valve: Mildly calcified annulus. - Right ventricle: The cavity size was moderately dilated. Systolic   function was moderately reduced. - Right atrium: The atrium was mildly to moderately dilated. - Pulmonary arteries: Systolic pressure was mildly increased. PA   peak pressure: 36 mm Hg (S).  Impressions:  - Technically difficult study    Subjective: Denies complaints. Denies dyspnea. No chest pain or cough reported. Leg swelling slowly improving. RN informed about right lower extremity wound and wound care consulted.  Discharge Exam:  Vitals:   02/04/16 1219 02/04/16 2033 02/05/16 0426 02/05/16 1319  BP: (!) 112/55 (!) 122/41 (!) 121/58 (!) 119/51  Pulse: (!) 57 79 64 62  Resp: _0 Temp: 97.5 F (36.4 C) 97.2 F (36.2 C) 97.9 F (36.6 C)   TempSrc: Oral Oral Oral   SpO2: 97% 94% 94% (!) 89%  Weight:   116 kg (255 lb 12.8 oz)   Height:        General: Pleasant elderly female, moderately built and obese sitting up comfortably in chair this morning. Cardiovascular: S1 & S2 heard, RRR, S1/S2 +. No murmurs, rubs, gallops or clicks. No JVD. Chronic venous stasis changes of lower extremity and 1+ pitting edema of both lower extremities. Telemetry: PAF/sinus rhythm mostly Respiratory: Clear to auscultation without wheezing, rhonchi or  crackles. No increased work of breathing. Abdominal:  Non distended,  non tender & soft. No organomegaly or masses appreciated. Normal bowel sounds heard. CNS: Alert and oriented. No focal deficits. Extremities: no edema, no cyanosis. Right lower anterior leg with approximately 4 cm diameter superficial ulcer with some slough but no other acute findings. Changes of venous stasis dermatitis bilateral lower legs.    The results of significant diagnostics from this hospitalization (including imaging, microbiology, ancillary and laboratory) are listed below for reference.     Microbiology: Recent Results (from the past 240 hour(s))  MRSA PCR Screening     Status: None   Collection Time: 01/27/16 11:52 AM  Result Value Ref Range Status   MRSA by PCR NEGATIVE NEGATIVE Final    Comment:        The GeneXpert MRSA Assay (FDA approved for NASAL specimens only), is one component of a comprehensive MRSA colonization surveillance program. It is not intended to diagnose MRSA infection nor to guide or monitor treatment for MRSA infections.      Labs: BNP (last 3 results)  Recent Labs  01/27/16 0844  BNP 103.0*   Basic Metabolic Panel:  Recent Labs Lab 01/31/16 0259 02/01/16 0448 02/02/16 0309 02/03/16 0426 02/04/16 0221 02/05/16 0254  NA 139 141 140 141 139 139  K 4.1 4.2 3.9 3.8 3.7 3.6  CL 96* 96* 92* 89* 85* 83*  CO2 36* 34* 35* 38* 42* 43*  GLUCOSE 109* 101* 110* 127* 123* 116*  BUN 52* 59* 59* 57* 57* 58*  CREATININE 1.72* 1.81* 1.63* 1.71* 1.75* 1.84*  CALCIUM 8.4* 8.7* 8.6* 8.9 8.5* 8.7*  PHOS 4.7*  --   --   --   --   --    Liver Function Tests:  Recent Labs Lab 01/30/16 0155 01/31/16 0259  AST 14*  --   ALT 16  --   ALKPHOS 83  --   BILITOT 0.9  --   PROT 6.0*  --   ALBUMIN 2.4* 2.4*   No results for input(s): LIPASE, AMYLASE in the last 168 hours. No results for input(s): AMMONIA in the last 168 hours. CBC:  Recent Labs Lab 01/30/16 0155  02/04/16 0221  WBC 7.7 9.8  HGB 8.7* 9.1*  HCT 30.5* 31.4*  MCV 85.0 82.2  PLT 328 354   CBG:  Recent Labs Lab 02/04/16 1217 02/04/16 1722 02/04/16 2101 02/05/16 0554 02/05/16 1150  GLUCAP 132* 110* 242* 132* 142*   Urinalysis    Component Value Date/Time   COLORURINE YELLOW 01/29/2016 0508   APPEARANCEUR CLEAR 01/29/2016 0508   LABSPEC 1.009 01/29/2016 0508   PHURINE 5.0 01/29/2016 0508   GLUCOSEU NEGATIVE 01/29/2016 0508   HGBUR SMALL (A) 01/29/2016 0508   BILIRUBINUR NEGATIVE 01/29/2016 0508   KETONESUR NEGATIVE 01/29/2016 0508   PROTEINUR 100 (A) 01/29/2016 0508   NITRITE NEGATIVE 01/29/2016 0508   LEUKOCYTESUR SMALL (A) 01/29/2016 0508     Time coordinating discharge: Over 30 minutes  SIGNED:  Vernell Leep, MD, FACP, FHM. [facility] Pager [phone]  If 7PM-7AM, please contact night-coverage [email or web address] Password [email or web address] 02/05/2016, 3:00 PM

## 2016-02-05 NOTE — Clinical Social Work Note (Addendum)
CSW met with patient. She has accepted bed offer at [facility]. Clinicals faxed to [facility] for review. Acceptance or denial for insurance authorization can take up to 4 hours. Patient agreeable to home health if she is denied. CSW updated patient's son, [name]. Discussed ALF process as well. CSW asked him to contact an ALF for more specific questions related to how residents pay for it.  [name], CSW [phone]  10:42 am CSW confirmed with [facility] that they received the faxed clinicals.  [name], [place] [phone]  12:54 pm Patient does not want a bipap machine at SNF. MD and SNF notified.  [name], [place] [phone]  2:13 pm Insurance authorization obtained: [id number] with a rug level of RVB. SNF and patient notified.  [name], [place]

## 2016-02-05 NOTE — Clinical Social Work Placement (Signed)
   CLINICAL SOCIAL WORK PLACEMENT  NOTE  Date:  02/05/2016  Patient Details  Name: Sue Roach MRN: [id number] Date of Birth: 11/14/1940  Clinical Social Work is seeking post-discharge placement for this patient at the Skilled  Nursing Facility level of care (*CSW will initial, date and re-position this form in  chart as items are completed):      Patient/family provided with [facility] Clinical Social Work Department's list of facilities offering this level of care within the geographic area requested by the patient (or if unable, by the patient's family).      Patient/family informed of their freedom to choose among providers that offer the needed level of care, that participate in Medicare, Medicaid or managed care program needed by the patient, have an available bed and are willing to accept the patient.      Patient/family informed of [place]'s ownership interest in [facility] and [facility], as well as of the fact that they are under no obligation to receive care at these facilities.  PASRR submitted to EDS on       PASRR number received on       Existing PASRR number confirmed on 02/01/16     FL2 transmitted to all facilities in geographic area requested by pt/family on 02/01/16     FL2 transmitted to all facilities within larger geographic area on       Patient informed that his/her managed care company has contracts with or will negotiate with certain facilities, including the following:        Yes   Patient/family informed of bed offers received.  Patient [facility]     Physician recommends and patient chooses bed at      Patient to be [facility] on 02/05/16.  Patient to be transferred to facility by [facility]     Patient family notified on 02/05/16 of transfer.  Name of family member notified:  Peyton NajjarLarry     PHYSICIAN Please prepare prescriptions     Additional Comment:     _______________________________________________ Margarito LinerSarah C Tenee Wish, LCSW 02/05/2016, 3:54 PM

## 2016-02-05 NOTE — Discharge Instructions (Signed)
Heart Failure °Heart failure is a condition in which the heart has trouble pumping blood because it has become weak or stiff. This means that the heart does not pump blood efficiently for the body to work well. For some people with heart failure, fluid may back up into the lungs and there may be swelling (edema) in the lower legs. Heart failure is usually a long-term (chronic) condition. It is important for you to take good care of yourself and follow the treatment plan from your health care provider. °What are the causes? °This condition is caused by some health problems, including: °· High blood pressure (hypertension). Hypertension causes the heart muscle to work harder than normal. High blood pressure eventually causes the heart to become stiff and weak. °· Coronary artery disease (CAD). CAD is the buildup of cholesterol and fat (plaques) in the arteries of the heart. °· Heart attack (myocardial infarction). Injured tissue, which is caused by the heart attack, does not contract as well and the heart's ability to pump blood is weakened. °· Abnormal heart valves. When the heart valves do not open and close properly, the heart muscle must pump harder to keep the blood flowing. °· Heart muscle disease (cardiomyopathy or myocarditis). Heart muscle disease is damage to the heart muscle from a variety of causes, such as drug or alcohol abuse, infections, or unknown causes. These can increase the risk of heart failure. °· Lung disease. When the lungs do not work properly, the heart must work harder. ° °What increases the risk? °Risk of heart failure increases as a person ages. This condition is also more likely to develop in people who: °· Are overweight. °· Are female. °· Smoke or chew tobacco. °· Abuse alcohol or illegal drugs. °· Have taken medicines that can damage the heart, such as chemotherapy drugs. °· Have diabetes. °? High blood sugar (glucose) is associated with high fat (lipid) levels in the blood. °? Diabetes  can also damage tiny blood vessels that carry nutrients to the heart muscle. °· Have abnormal heart rhythms. °· Have thyroid problems. °· Have low blood counts (anemia). ° °What are the signs or symptoms? °Symptoms of this condition include: °· Shortness of breath with activity, such as when climbing stairs. °· Persistent cough. °· Swelling of the feet, ankles, legs, or abdomen. °· Unexplained weight gain. °· Difficulty breathing when lying flat (orthopnea). °· Waking from sleep because of the need to sit up and get more air. °· Rapid heartbeat. °· Fatigue and loss of energy. °· Feeling light-headed, dizzy, or close to fainting. °· Loss of appetite. °· Nausea. °· Increased urination during the night (nocturia). °· Confusion. ° °How is this diagnosed? °This condition is diagnosed based on: °· Medical history, symptoms, and a physical exam. °· Diagnostic tests, which may include: °? Echocardiogram. °? Electrocardiogram (ECG). °? Chest X-ray. °? Blood tests. °? Exercise stress test. °? Radionuclide scans. °? Cardiac catheterization and angiogram. ° °How is this treated? °Treatment for this condition is aimed at managing the symptoms of heart failure. Medicines, behavioral changes, or other treatments may be necessary to treat heart failure. °Medicines °These may include: °· Angiotensin-converting enzyme (ACE) inhibitors. This type of medicine blocks the effects of a blood protein called angiotensin-converting enzyme. ACE inhibitors relax (dilate) the blood vessels and help to lower blood pressure. °· Angiotensin receptor blockers (ARBs). This type of medicine blocks the actions of a blood protein called angiotensin. ARBs dilate the blood vessels and help to lower blood pressure. °· Water   pills (diuretics). Diuretics cause the kidneys to remove salt and water from the blood. The extra fluid is removed through urination, leaving a lower volume of blood that the heart has to pump. °· Beta blockers. These improve heart  muscle strength and they prevent the heart from beating too quickly. °· Digoxin. This increases the force of the heartbeat. ° °Healthy behavior changes °These may include: °· Reaching and maintaining a healthy weight. °· Stopping smoking or chewing tobacco. °· Eating heart-healthy foods. °· Limiting or avoiding alcohol. °· Stopping use of street drugs (illegal drugs). °· Physical activity. ° °Other treatments °These may include: °· Surgery to open blocked coronary arteries or repair damaged heart valves. °· Placement of a biventricular pacemaker to improve heart muscle function (cardiac resynchronization therapy). This device paces both the right ventricle and left ventricle. °· Placement of a device to treat serious abnormal heart rhythms (implantable cardioverter defibrillator, or ICD). °· Placement of a device to improve the pumping ability of the heart (left ventricular assist device, or LVAD). °· Heart transplant. This can cure heart failure, and it is considered for certain patients who do not improve with other therapies. ° °Follow these instructions at home: °Medicines °· Take over-the-counter and prescription medicines only as told by your health care provider. Medicines are important in reducing the workload of your heart, slowing the progression of heart failure, and improving your symptoms. °? Do not stop taking your medicine unless your health care provider told you to do that. °? Do not skip any dose of medicine. °? Refill your prescriptions before you run out of medicine. You need your medicines every day. °Eating and drinking ° °· Eat heart-healthy foods. Talk with a dietitian to make an eating plan that is right for you. °? Choose foods that contain no trans fat and are low in saturated fat and cholesterol. Healthy choices include fresh or frozen fruits and vegetables, fish, lean meats, legumes, fat-free or low-fat dairy products, and whole-grain or high-fiber foods. °? Limit salt (sodium) if  directed by your health care provider. Sodium restriction may reduce symptoms of heart failure. Ask a dietitian to recommend heart-healthy seasonings. °? Use healthy cooking methods instead of frying. Healthy methods include roasting, grilling, broiling, baking, poaching, steaming, and stir-frying. °· Limit your fluid intake if directed by your health care provider. Fluid restriction may reduce symptoms of heart failure. °Lifestyle °· Stop smoking or using chewing tobacco. Nicotine and tobacco can damage your heart and your blood vessels. Do not use nicotine gum or patches before talking to your health care provider. °· Limit alcohol intake to no more than 1 drink per day for non-pregnant women and 2 drinks per day for men. One drink equals 12 oz of beer, 5 oz of wine, or 1½ oz of hard liquor. °? Drinking more than that is harmful to your heart. Tell your health care provider if you drink alcohol several times a week. °? Talk with your health care provider about whether any level of alcohol use is safe for you. °? If your heart has already been damaged by alcohol or you have severe heart failure, drinking alcohol should be stopped completely. °· Stop use of illegal drugs. °· Lose weight if directed by your health care provider. Weight loss may reduce symptoms of heart failure. °· Do moderate physical activity if directed by your health care provider. People who are elderly and people with severe heart failure should consult with a health care provider for physical activity recommendations. °  Monitor important information °· Weigh yourself every day. Keeping track of your weight daily helps you to notice excess fluid sooner. °? Weigh yourself every morning after you urinate and before you eat breakfast. °? Wear the same amount of clothing each time you weigh yourself. °? Record your daily weight. Provide your health care provider with your weight record. °· Monitor and record your blood pressure as told by your health  care provider. °· Check your pulse as told by your health care provider. °Dealing with extreme temperatures °· If the weather is extremely hot: °? Avoid vigorous physical activity. °? Use air conditioning or fans or seek a cooler location. °? Avoid caffeine and alcohol. °? Wear loose-fitting, lightweight, and light-colored clothing. °· If the weather is extremely cold: °? Avoid vigorous physical activity. °? Layer your clothes. °? Wear mittens or gloves, a hat, and a scarf when you go outside. °? Avoid alcohol. °General instructions °· Manage other health conditions such as hypertension, diabetes, thyroid disease, or abnormal heart rhythms as told by your health care provider. °· Learn to manage stress. If you need help to do this, ask your health care provider. °· Plan rest periods when fatigued. °· Get ongoing education and support as needed. °· Participate in or seek rehabilitation as needed to maintain or improve independence and quality of life. °· Stay up to date with immunizations. Keeping current on pneumococcal and influenza immunizations is especially important to prevent respiratory infections. °· Keep all follow-up visits as told by your health care provider. This is important. °Contact a health care provider if: °· You have a rapid weight gain. °· You have increasing shortness of breath that is unusual for you. °· You are unable to participate in your usual physical activities. °· You tire easily. °· You cough more than normal, especially with physical activity. °· You have any swelling or more swelling in areas such as your hands, feet, ankles, or abdomen. °· You are unable to sleep because it is hard to breathe. °· You feel like your heart is beating quickly (palpitations). °· You become dizzy or light-headed when you stand up. °Get help right away if: °· You have difficulty breathing. °· You notice or your family notices a change in your awareness, such as having trouble staying awake or having  difficulty with concentration. °· You have pain or discomfort in your chest. °· You have an episode of fainting (syncope). °This information is not intended to replace advice given to you by your health care provider. Make sure you discuss any questions you have with your health care provider. °Document Released: 02/07/2005 Document Revised: 10/13/2015 Document Reviewed: 09/02/2015 °Elsevier Interactive Patient Education © 2017 [employer] ° °

## 2016-02-05 NOTE — Progress Notes (Addendum)
   Pt is pending d/c today   Asked for d/c recommendations for Furosemide.  Sliding scale Lasix: Weigh yourself when you get home, then Daily in the Morning. Your dry weight will be what your scale says on the day you return home.(here is 255 lbs.). Standing Dose 120 mg (1.5 tab) in AM & 120 mg (1.5 tab) ~5-6 hours later.   If you gain more than 3 pounds from dry weight: Increase the Lasix dosing to 160 mg (2 tab) in the morning and 160 mg in the afternoon until weight returns to baseline dry weight.  If weight gain is greater than 5 pounds in 2 days: Increased to Lasix 160 mg 3 x a day and contact PCP or Dr[facility] for further assistance if weight does not go down the next day.  If the weight goes down more than 3 pounds from dry weight: Hold Lasix until it returns to baseline dry weight  Agree with increase BB dose for intermittent ? PAF/PAT.    Bryan Lemmaavid Teola Felipe, MD

## 2016-02-08 ENCOUNTER — Non-Acute Institutional Stay (SKILLED_NURSING_FACILITY): Payer: Medicare Other | Admitting: Internal Medicine

## 2016-02-08 ENCOUNTER — Encounter: Payer: Self-pay | Admitting: Internal Medicine

## 2016-02-08 DIAGNOSIS — N182 Chronic kidney disease, stage 2 (mild): Secondary | ICD-10-CM | POA: Diagnosis not present

## 2016-02-08 DIAGNOSIS — I272 Pulmonary hypertension, unspecified: Secondary | ICD-10-CM | POA: Diagnosis not present

## 2016-02-08 DIAGNOSIS — D638 Anemia in other chronic diseases classified elsewhere: Secondary | ICD-10-CM

## 2016-02-08 DIAGNOSIS — E034 Atrophy of thyroid (acquired): Secondary | ICD-10-CM

## 2016-02-08 DIAGNOSIS — N170 Acute kidney failure with tubular necrosis: Secondary | ICD-10-CM | POA: Diagnosis not present

## 2016-02-08 DIAGNOSIS — I5033 Acute on chronic diastolic (congestive) heart failure: Secondary | ICD-10-CM | POA: Diagnosis not present

## 2016-02-08 DIAGNOSIS — E118 Type 2 diabetes mellitus with unspecified complications: Secondary | ICD-10-CM | POA: Diagnosis not present

## 2016-02-08 DIAGNOSIS — J9621 Acute and chronic respiratory failure with hypoxia: Secondary | ICD-10-CM | POA: Diagnosis not present

## 2016-02-08 DIAGNOSIS — I110 Hypertensive heart disease with heart failure: Secondary | ICD-10-CM

## 2016-02-08 DIAGNOSIS — B029 Zoster without complications: Secondary | ICD-10-CM | POA: Insufficient documentation

## 2016-02-08 DIAGNOSIS — I4892 Unspecified atrial flutter: Secondary | ICD-10-CM

## 2016-02-08 DIAGNOSIS — J441 Chronic obstructive pulmonary disease with (acute) exacerbation: Secondary | ICD-10-CM

## 2016-02-08 DIAGNOSIS — N289 Disorder of kidney and ureter, unspecified: Secondary | ICD-10-CM | POA: Insufficient documentation

## 2016-02-08 NOTE — Progress Notes (Signed)
: Provider:  Randon GoldsmithAnne D. Lyn HollingsheadAlexander, MD Location:  [facility] Room Number: 104P Place of Service:  SNF ([phone])  PCP: Kaleen MaskELKINS,WILSON OLIVER, MD Patient Care Team: Kaleen MaskWilson Oliver Elkins, MD as PCP - General (Family Medicine)  Extended Emergency Contact Information Primary Emergency Contact: Homero Fellershomas,Phoebe  [place] Home Phone: [phone] Mobile Phone: [phone] Relation: Daughter Secondary Emergency Contact: Lynetta Marealvert,Sam  [place] Mobile Phone: [phone] Relation: None     Allergies: Sulfa antibiotics and Zinc  Chief Complaint  Patient presents with  . New Admit To SNF    Admit to Facility    HPI: Patient is 75 y.o. female with Chronic Diastolic CHF, DM2, and HTN who presented with acute shortness of breath. When EMS arrived she was slumped over in a chair, minimally responsive. Her O2 sats were 61% on RA. EMS applied 4 L with improvement to 83%. Her level of consciousness improved. She was started on CPAP, with immediate improvement of symptoms. The patient had a recent 10-12 pound weight gain, with decreased urine output accompanied by increased lower extremity edema. 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 Discussions were had with pt re the terminal nature of all these processes working together but pt was reported to be un-engaged. Pt is admitted to SNF wit generalized weakness for OT/PT. While at SNF pt will be followed for PAF, tx with ASA and coreg, HTN, tx with coreg, lasix,hydralazine and DM, tx with insulin.   Past Medical History:  Diagnosis Date  . CHF (congestive heart failure) (HCC)   . Diabetes mellitus without complication (HCC)   . Hypertension   . Kidney disease   . Renal disorder   . Shingles     History reviewed. No pertinent surgical history.  Allergies as of 02/08/2016      Reactions   Sulfa Antibiotics Other (See Comments)   unknown   Zinc Rash      Medication List       Accurate as of 02/08/16  9:28 AM. Always use your most recent med list.          acetaminophen 325 MG tablet Commonly known as:  TYLENOL Take 2 tablets (650 mg total) by mouth every 4 (four) hours as needed for mild pain, moderate pain, fever or headache.   aspirin EC 81 MG tablet Take 81 mg by mouth daily.   carvedilol 3.125 MG tablet Commonly known as:  COREG Take 1 tablet (3.125 mg total) by mouth 2 (two) times daily with a meal.   docusate sodium 100 MG capsule Commonly known as:  COLACE Take 100 mg by mouth 2 (two) times daily as needed for constipation.   furosemide 80 MG tablet Commonly known as:  LASIX Take 1.5 tablets (120 mg total) by mouth 2 (two) times daily.   hydrALAZINE 25 MG tablet Commonly known as:  APRESOLINE Take 25 mg by mouth 3 (three) times daily.   insulin aspart 100 UNIT/ML injection Commonly known as:  novoLOG Inject 0-9 Units into the skin 3 (three) times daily with meals. CBG < 70: implement hypoglycemia protocol CBG 70 - 120: 0 units CBG 121 - 150: 1 unit CBG 151 - 200: 2 units CBG 201 - 250: 3 units CBG 251 - 300: 5 units CBG 301 - 350: 7 units CBG 351 - 400: 9 units CBG > 400: call MD.  levothyroxine 25 MCG tablet Commonly known as:  SYNTHROID, LEVOTHROID Take 1 tablet (25 mcg total) by mouth daily before breakfast.   potassium chloride SA 20 MEQ tablet Commonly known as:  K-DUR,KLOR-CON Take 2 tablets (40 mEq total) by mouth 2 (two) times daily.       No orders of the defined types were placed in this encounter.   Immunization History  Administered Date(s) Administered  . Influenza-Unspecified 10/23/2014  . PPD Test 02/05/2016    Social History  Substance Use Topics  . Smoking status: Current Every Day Smoker    Packs/day: 1.00    Types: Cigarettes  . Smokeless tobacco: Never Used  . Alcohol use No    Family history is    History reviewed. No pertinent family history.    Review of Systems  DATA OBTAINED: from patient GENERAL:  no fevers, fatigue, appetite changes SKIN: No itching, or rash EYES: No eye pain, redness, discharge EARS: No earache, tinnitus, change in hearing NOSE: No congestion, drainage or bleeding  MOUTH/THROAT: No mouth or tooth pain, No sore throat RESPIRATORY: No cough, wheezing, no more SOB than usual CARDIAC: No chest pain, palpitations, lower extremity edema  GI: No abdominal pain, No N/V/D or constipation, No heartburn or reflux  GU: No dysuria, frequency or urgency, or incontinence  MUSCULOSKELETAL: No unrelieved bone/joint pain NEUROLOGIC: No headache, dizziness or focal weakness PSYCHIATRIC: No c/o anxiety or sadness   Vitals:   02/08/16 0903  BP: 139/63  Pulse: 72  Resp: (!) 22  Temp: 97 F (36.1 C)    SpO2 Readings from Last 1 Encounters:  02/08/16 92%   Body mass index is 52.7 kg/m.     Physical Exam  GENERAL APPEARANCE: Alert, conversant,  No acute distress, obe SKIN: No diaphoresis rash HEAD: Normocephalic, atraumatic  EYES: Conjunctiva/lids clear. Pupils round, reactive. EOMs intact.  EARS: External exam WNL, canals clear. Hearing grossly normal.  NOSE: No deformity or discharge.  MOUTH/THROAT: Lips w/o lesions  RESPIRATORY: Breathing is even, unlabored. Lung sounds are clear   CARDIOVASCULAR: Heart irreg 3/6 systolic murmur, no rubs or gallops. trace peripheral edema.   GASTROINTESTINAL: Abdomen is soft, non-tender, not distended w/ normal bowel sounds. GENITOURINARY: Bladder non tender, not distended  MUSCULOSKELETAL: No abnormal joints or musculature NEUROLOGIC:  Cranial nerves 2-12 grossly intact. Moves all extremities  PSYCHIATRIC: Mood and affect appropriate to situation, no behavioral issues  Patient Active Problem List   Diagnosis Date Noted  . Shingles   . Kidney disease   . CHF exacerbation (HCC)   . Advance care planning   .  Palliative care by specialist   . Goals of care, counseling/discussion   . COPD exacerbation (HCC)   . Obstructive sleep apnea   . Obesity hypoventilation syndrome (HCC)   . Chronic systolic CHF (congestive heart failure) (HCC)   . Dilated cardiomyopathy (HCC)   . Pulmonary hypertension   . Controlled diabetes mellitus type 2 with complications (HCC)   . Acute renal failure with acute tubular necrosis superimposed on stage 2 chronic kidney disease (HCC)   . CHF (congestive heart failure) (HCC) 01/27/2016  . Acute respiratory failure with hypoxia (HCC) 01/27/2016  . Chronic kidney disease 01/27/2016  . Diabetes mellitus, type II (HCC) 01/27/2016  . Leg wound, left 01/27/2016  . Anemia of chronic disease 01/27/2016  . Tobacco abuse 01/27/2016  . Diabetes mellitus with complication (HCC)   . Impetigo 05/29/2015  . Morbid obesity due to excess calories (HCC) 05/29/2015  . Acute  on chronic diastolic congestive heart failure (HCC) 05/29/2015      Labs reviewed: Basic Metabolic Panel:    Component Value Date/Time   NA 139 02/05/2016 0254   NA 139 02/05/2016 0254   K 3.6 02/05/2016 0254   CL 83 (L) 02/05/2016 0254   CO2 43 (H) 02/05/2016 0254   GLUCOSE 116 (H) 02/05/2016 0254   BUN 58 (H) 02/05/2016 0254   BUN 58 (A) 02/05/2016 0254   CREATININE 1.84 (H) 02/05/2016 0254   CREATININE 1.8 (A) 02/05/2016 0254   CALCIUM 8.7 (L) 02/05/2016 0254   PROT 6.0 (L) 01/30/2016 0155   ALBUMIN 2.4 (L) 01/31/2016 0259   AST 14 (L) 01/30/2016 0155   ALT 16 01/30/2016 0155   ALKPHOS 83 01/30/2016 0155   BILITOT 0.9 01/30/2016 0155   GFRNONAA 26 (L) 02/05/2016 0254   GFRAA 30 (L) 02/05/2016 0254     Recent Labs  01/29/16 0141  01/31/16 0259  02/03/16 0426 02/04/16 0221 02/05/16 0254  NA 136  < > 139  139  < > 141  141 139  139 139  139  K 3.7  < > 4.1  4.1  < > 3.8  3.8 3.7 3.6  CL 97*  < > 96*  < > 89* 85* 83*  CO2 28  < > 36*  < > 38* 42* 43*  GLUCOSE 125*  < > 109*  < >  127* 123* 116*  BUN 43*  < > 52*  52*  < > 57*  57* 57*  57* 58*  58*  CREATININE 1.71*  < > 1.72*  1.7*  < > 1.71*  1.7* 1.75*  1.8* 1.84*  1.8*  CALCIUM 8.1*  < > 8.4*  < > 8.9 8.5* 8.7*  MG 2.6*  --   --   --   --   --   --   PHOS  --   --  4.7*  --   --   --   --   < > = values in this interval not displayed. Liver Function Tests:  Recent Labs  01/30/16 0155 01/31/16 0259  AST 14*  --   ALT 16  --   ALKPHOS 83  --   BILITOT 0.9  --   PROT 6.0*  --   ALBUMIN 2.4* 2.4*   No results for input(s): LIPASE, AMYLASE in the last 8760 hours. No results for input(s): AMMONIA in the last 8760 hours. CBC:  Recent Labs  01/27/16 0858 01/30/16 0155 02/04/16 0221  WBC 9.1  9.1 7.7  7.7 9.8  9.8  NEUTROABS 6.9  --   --   HGB 9.4*  9.4* 8.7*  8.7* 9.1*  HCT 31.5*  32* 30.5*  31* 31.4*  MCV 82.9 85.0 82.2  PLT 399  399 328  328 354   Lipid No results for input(s): CHOL, HDL, LDLCALC, TRIG in the last 8760 hours.  Cardiac Enzymes:  Recent Labs  01/27/16 1130 01/27/16 1630 01/27/16 2242  TROPONINI 0.04* 0.04* 0.03*   BNP:  Recent Labs  01/27/16 0844  BNP 512.7*   No results found for: [facility] Lab Results  Component Value Date   HGBA1C 6.5 (H) 01/27/2016   Lab Results  Component Value Date   TSH 6.845 (H) 01/27/2016   No results found for: VITAMINB12 No results found for: FOLATE No results found for: IRON, TIBC, FERRITIN  Imaging and Procedures obtained prior to SNF admission: Dg Chest 2 View  Result Date:  01/28/2016 CLINICAL DATA:  CHF exacerbation EXAM: CHEST  2 VIEW COMPARISON:  Portable chest x-ray of 01/27/2016 FINDINGS: The probable CHF previously has diminished and aeration has improved. Moderate cardiomegaly remains. There may be very minimal pulmonary vascular congestion remaining. No effusion is seen. No bony abnormality is noted. IMPRESSION: Improvement in the edema pattern with perhaps mild residual pulmonary vascular congestion.  Stable cardiomegaly Electronically Signed   By: Dwyane DeePaul  Barry M.D.   On: 01/28/2016 09:51   Dg Chest Port 1 View  Result Date: 01/27/2016 CLINICAL DATA:  Short of breath, low oxygen saturation, smoking history EXAM: PORTABLE CHEST 1 VIEW COMPARISON:  Chest x-ray of 01/24/2014 FINDINGS: The lungs are poorly aerated. There is underpenetration of the lung bases, and the left lung base in particular cannot be well evaluated. Pneumonia or atelectasis cannot be excluded at the left lung base. There is cardiomegaly present with mild pulmonary vascular congestion. No bony abnormality is seen. IMPRESSION: Poor inspiration film with poor penetration. Probable mild CHF with cardiomegaly and pulmonary vascular congestion. Cannot evaluate the left lung base. Electronically Signed   By: Dwyane DeePaul  Barry M.D.   On: 01/27/2016 09:12     Not all labs, radiology exams or other studies done during hospitalization come through on my EPIC note; however they are reviewed by me.    Assessment and Plan  ACUTE ON CHRONIC HYPOXIC RESP FAILURE/ACUTE ON CHRONIC DIASTOLIC CHF/ OSA/ COPD - pt refused to wear pulse ox in hosp to watch o2 status, refuses BIPAP; pt diuresed 14L, lasix inc to 120 mg BID and compression stockings;may need to inc to 160 mg BID and zaroxolyn and  give a little on renal fx to keep fluid off; f/u with nephrology who serves as her PCP SNF - admit weight was 250 pounds; cont 120 mg BID for now and f/u BMP; pt will not wear BIPAP  ACUTE ON CKD2 - Cr remained constant 1.6-1.8 during diuresis; may need to accept hihger Cr to keep fluid off  PULMONARY HTN  HTN SNF - stable;cont coreg 3.125 BId, Lasix 120 mg BID and hydralazine 25 mg TID  DM2 - A1c 6.5; d/c orals 2/2 renal insuff and volume overload SNF - cont SSI  ANEMIA OF CHRONIC DX SNF - baseline Hb 9; with f/u CBC  HYPOTHYROIDISM -- TSH 6.8;synthroid 25 mcg started SNF - cont synthroid 25 mcg daily  PAROXYSMAL ATRIAL FLUTTER - felt precipitated by  resp status SNF - plan cont ASA and coreg 3.125 mg BID; pt declines anticoagulation   Time spent > 45 min;> 50% of time with patient was spent reviewing records, labs, tests and studies, counseling and developing plan of care  Thurston Holenne D. Lyn HollingsheadAlexander, MD

## 2016-02-11 LAB — BASIC METABOLIC PANEL
BUN: 55 mg/dL — AB (ref 4–21)
CREATININE: 1.9 mg/dL — AB (ref 0.5–1.1)
Glucose: 182 mg/dL
POTASSIUM: 4.5 mmol/L (ref 3.4–5.3)
SODIUM: 140 mmol/L (ref 137–147)

## 2016-02-11 LAB — CBC AND DIFFERENTIAL
HCT: 36 % (ref 36–46)
Hemoglobin: 10.7 g/dL — AB (ref 12.0–16.0)
Platelets: 463 10*3/uL — AB (ref 150–399)
WBC: 8.7 10*3/mL

## 2016-02-15 ENCOUNTER — Encounter: Payer: Self-pay | Admitting: Internal Medicine

## 2016-02-15 DIAGNOSIS — J9621 Acute and chronic respiratory failure with hypoxia: Secondary | ICD-10-CM | POA: Insufficient documentation

## 2016-02-15 DIAGNOSIS — I4892 Unspecified atrial flutter: Secondary | ICD-10-CM | POA: Insufficient documentation

## 2016-02-15 DIAGNOSIS — E039 Hypothyroidism, unspecified: Secondary | ICD-10-CM | POA: Insufficient documentation

## 2016-02-15 DIAGNOSIS — I110 Hypertensive heart disease with heart failure: Secondary | ICD-10-CM | POA: Insufficient documentation

## 2016-02-17 ENCOUNTER — Non-Acute Institutional Stay (SKILLED_NURSING_FACILITY): Payer: Medicare Other | Admitting: Internal Medicine

## 2016-02-17 ENCOUNTER — Encounter: Payer: Self-pay | Admitting: Internal Medicine

## 2016-02-17 DIAGNOSIS — N182 Chronic kidney disease, stage 2 (mild): Secondary | ICD-10-CM | POA: Diagnosis not present

## 2016-02-17 DIAGNOSIS — E118 Type 2 diabetes mellitus with unspecified complications: Secondary | ICD-10-CM

## 2016-02-17 DIAGNOSIS — N170 Acute kidney failure with tubular necrosis: Secondary | ICD-10-CM | POA: Diagnosis not present

## 2016-02-17 DIAGNOSIS — E034 Atrophy of thyroid (acquired): Secondary | ICD-10-CM

## 2016-02-17 DIAGNOSIS — I5033 Acute on chronic diastolic (congestive) heart failure: Secondary | ICD-10-CM

## 2016-02-17 DIAGNOSIS — I4892 Unspecified atrial flutter: Secondary | ICD-10-CM

## 2016-02-17 DIAGNOSIS — G4733 Obstructive sleep apnea (adult) (pediatric): Secondary | ICD-10-CM | POA: Diagnosis not present

## 2016-02-17 DIAGNOSIS — J441 Chronic obstructive pulmonary disease with (acute) exacerbation: Secondary | ICD-10-CM | POA: Diagnosis not present

## 2016-02-17 DIAGNOSIS — I272 Pulmonary hypertension, unspecified: Secondary | ICD-10-CM | POA: Diagnosis not present

## 2016-02-17 DIAGNOSIS — J9621 Acute and chronic respiratory failure with hypoxia: Secondary | ICD-10-CM

## 2016-02-17 DIAGNOSIS — I110 Hypertensive heart disease with heart failure: Secondary | ICD-10-CM | POA: Diagnosis not present

## 2016-02-17 DIAGNOSIS — D638 Anemia in other chronic diseases classified elsewhere: Secondary | ICD-10-CM | POA: Diagnosis not present

## 2016-02-17 NOTE — Progress Notes (Signed)
Location:  [facility] Room Number: 104P Place of Service:  SNF (31)  Sue Roach. Lyn Hollingshead, MD  PCP: Kaleen Mask, MD Patient Care Team: Kaleen Mask, MD as PCP - General [facility] Medicine)  Extended Emergency Contact Information Primary Emergency Contact: Homero Fellers [place] Home Phone: [phone] Mobile Phone: [phone] Relation: Daughter Secondary Emergency Contact: Lynetta Mare [place] Mobile Phone: [phone] Relation: None  Allergies  Allergen Reactions  . Sulfa Antibiotics Other (See Comments)    unknown  . Zinc Rash    Chief Complaint  Patient presents with  . Discharge Note    Discharged from SNF    HPI:  75 y.o. female  with Chronic Diastolic CHF, DM2, and HTN who presented with acute shortness of breath. When EMS arrived she was slumped over in a chair, minimally responsive. Her O2 sats were 61% on RA. EMS applied 4 L with improvement to 83%. Her level of consciousness improved. She was started on CPAP, with immediate improvement of symptoms. The patient had a recent 10-12 pound weight gain, with decreased urine output accompanied by increased lower extremity edema. Pt was admitted to [facility] from 12/6-15 acute on chronic resp failure 2/2 acute on chronic CHF, COPD, OSA, interventions limited by acute on CKD 3. Discussions were had with pt re the terminal nature of all these processes working together but pt was reported to be un-engaged. Pt was admitted to SNF wit generalized weakness for OT/PT and is now ready to be d/c to home.    Past Medical History:  Diagnosis Date  . CHF (congestive heart failure) (HCC)   . Diabetes mellitus without complication (HCC)   . Hypertension   . Kidney disease   . Renal disorder   . Shingles     No past surgical history on file.   reports that she has been smoking Cigarettes.  She has been smoking about 1.00 pack per day. She has never  used smokeless tobacco. She reports that she does not drink alcohol or use drugs. Social History   Social History  . Marital status: Widowed    Spouse name: N/A  . Number of children: N/A  . Years of education: N/A   Occupational History  . Not on file.   Social History Main Topics  . Smoking status: Current Every Day Smoker    Packs/day: 1.00    Types: Cigarettes  . Smokeless tobacco: Never Used  . Alcohol use No  . Drug use: No  . Sexual activity: Not on file   Other Topics Concern  . Not on file   Social History Narrative  . No narrative on file    Pertinent  Health Maintenance Due  Topic Date Due  . FOOT EXAM  08/05/1949  . OPHTHALMOLOGY EXAM  08/05/1949  . URINE MICROALBUMIN  08/05/1949  . DEXA SCAN  08/05/2004  . PNA vac Low Risk Adult (1 of 2 - PCV13) 08/05/2004  . INFLUENZA VACCINE  09/22/2015  . HEMOGLOBIN A1C  07/27/2016    Medications: Allergies as of 02/17/2016      Reactions   Sulfa Antibiotics Other (See Comments)   unknown   Zinc Rash      Medication List       Accurate as of 02/17/16  5:52 PM. Always use your most recent med list.          acetaminophen 325 MG tablet Commonly known as:  TYLENOL Take 2 tablets (650  mg total) by mouth every 4 (four) hours as needed for mild pain, moderate pain, fever or headache.   aspirin EC 81 MG tablet Take 81 mg by mouth daily.   docusate sodium 100 MG capsule Commonly known as:  COLACE Take 100 mg by mouth 2 (two) times daily as needed for constipation.   furosemide 80 MG tablet Commonly known as:  LASIX Take 1.5 tablets (120 mg total) by mouth 2 (two) times daily.   hydrALAZINE 25 MG tablet Commonly known as:  APRESOLINE Take 25 mg by mouth 3 (three) times daily.   insulin aspart 100 UNIT/ML injection Commonly known as:  novoLOG Inject 0-9 Units into the skin 3 (three) times daily with meals. CBG < 70: implement hypoglycemia protocol CBG 70 - 120: 0 units CBG 121 - 150: 1 unit CBG 151 -  200: 2 units CBG 201 - 250: 3 units CBG 251 - 300: 5 units CBG 301 - 350: 7 units CBG 351 - 400: 9 units CBG > 400: call MD.   levothyroxine 25 MCG tablet Commonly known as:  SYNTHROID, LEVOTHROID Take 1 tablet (25 mcg total) by mouth daily before breakfast.   METOPROLOL TARTRATE PO Take 6.25 mg by mouth 2 (two) times daily.   potassium chloride SA 20 MEQ tablet Commonly known as:  K-DUR,KLOR-CON Take 2 tablets (40 mEq total) by mouth 2 (two) times daily.        Vitals:   02/17/16 1032  BP: (!) 109/57  Pulse: 66  Resp: 20  Temp: 99.9 F (37.7 C)  Weight: 269 lb (122 kg)  Height: 5' (1.524 m)   Body mass index is 52.54 kg/m.  Physical Exam  GENERAL APPEARANCE: Alert, conversant. No acute distress.  HEENT: Unremarkable. RESPIRATORY: Breathing is even, unlabored. Lung sounds are clear   CARDIOVASCULAR: Heart RRR no murmurs, rubs or gallops. No peripheral edema.  GASTROINTESTINAL: Abdomen is soft, non-tender, not distended w/ normal bowel sounds.  NEUROLOGIC: Cranial nerves 2-12 grossly intact. Moves all extremities   Labs reviewed: Basic Metabolic Panel:  Recent Labs  16/10/96 0141  01/31/16 0259  02/03/16 0426 02/04/16 0221 02/05/16 0254 02/11/16  NA 136  < > 139  139  < > 141  141 139  139 139  139 140  K 3.7  < > 4.1  4.1  < > 3.8  3.8 3.7 3.6 4.5  CL 97*  < > 96*  < > 89* 85* 83*  --   CO2 28  < > 36*  < > 38* 42* 43*  --   GLUCOSE 125*  < > 109*  < > 127* 123* 116*  --   BUN 43*  < > 52*  52*  < > 57*  57* 57*  57* 58*  58* 55*  CREATININE 1.71*  < > 1.72*  1.7*  < > 1.71*  1.7* 1.75*  1.8* 1.84*  1.8* 1.9*  CALCIUM 8.1*  < > 8.4*  < > 8.9 8.5* 8.7*  --   MG 2.6*  --   --   --   --   --   --   --   PHOS  --   --  4.7*  --   --   --   --   --   < > = values in this interval not displayed. No results found for: [facility] Liver Function Tests:  Recent Labs  01/30/16 0155 01/31/16 0259  AST 14*  --   ALT 16  --  ALKPHOS 83  --     BILITOT 0.9  --   PROT 6.0*  --   ALBUMIN 2.4* 2.4*   No results for input(s): LIPASE, AMYLASE in the last 8760 hours. No results for input(s): AMMONIA in the last 8760 hours. CBC:  Recent Labs  01/27/16 0858 01/30/16 0155 02/04/16 0221 02/11/16  WBC 9.1  9.1 7.7  7.7 9.8  9.8 8.7  NEUTROABS 6.9  --   --   --   HGB 9.4*  9.4* 8.7*  8.7* 9.1* 10.7*  HCT 31.5*  32* 30.5*  31* 31.4* 36  MCV 82.9 85.0 82.2  --   PLT 399  399 328  328 354 463*   Lipid No results for input(s): CHOL, HDL, LDLCALC, TRIG in the last 8760 hours. Cardiac Enzymes:  Recent Labs  01/27/16 1130 01/27/16 1630 01/27/16 2242  TROPONINI 0.04* 0.04* 0.03*   BNP:  Recent Labs  01/27/16 0844  BNP 512.7*   CBG:  Recent Labs  02/04/16 2101 02/05/16 0554 02/05/16 1150  GLUCAP 242* 132* 142*    Procedures and Imaging Studies During Stay: Dg Chest 2 View  Result Date: 01/28/2016 CLINICAL DATA:  CHF exacerbation EXAM: CHEST  2 VIEW COMPARISON:  Portable chest x-ray of 01/27/2016 FINDINGS: The probable CHF previously has diminished and aeration has improved. Moderate cardiomegaly remains. There may be very minimal pulmonary vascular congestion remaining. No effusion is seen. No bony abnormality is noted. IMPRESSION: Improvement in the edema pattern with perhaps mild residual pulmonary vascular congestion. Stable cardiomegaly Electronically Signed   By: Dwyane DeePaul  Barry M.D.   On: 01/28/2016 09:51   Dg Chest Port 1 View  Result Date: 02/04/2016 CLINICAL DATA:  Acute on chronic systolic congestive heart failure. EXAM: PORTABLE CHEST 1 VIEW COMPARISON:  01/28/2016 FINDINGS: Cardiomegaly again demonstrated. Pulmonary venous hypertension. Mild interstitial edema. No infiltrate for collapse. Possible small amount of pleural fluid on the left. IMPRESSION: Persistent findings of congestive heart failure. Cardiomegaly. Mild interstitial edema. Electronically Signed   By: Paulina FusiMark  Shogry M.D.   On: 02/04/2016  10:01   Dg Chest Port 1 View  Result Date: 01/27/2016 CLINICAL DATA:  Short of breath, low oxygen saturation, smoking history EXAM: PORTABLE CHEST 1 VIEW COMPARISON:  Chest x-ray of 01/24/2014 FINDINGS: The lungs are poorly aerated. There is underpenetration of the lung bases, and the left lung base in particular cannot be well evaluated. Pneumonia or atelectasis cannot be excluded at the left lung base. There is cardiomegaly present with mild pulmonary vascular congestion. No bony abnormality is seen. IMPRESSION: Poor inspiration film with poor penetration. Probable mild CHF with cardiomegaly and pulmonary vascular congestion. Cannot evaluate the left lung base. Electronically Signed   By: Dwyane DeePaul  Barry M.D.   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  Social services at facility was responsible for arranging this appointment.  Pt was provided with a 30 day supply of prescriptions for medications and refills must be obtained from their PCP.  For controlled substances, a more limited supply may be provided adequate until PCP  appointment only.  TSH needs to be drawn in 4 weeks   Time spent > 35 min;> 50% of time with patient was spent reviewing records, labs, tests and studies, counseling and developing plan of care  Thurston Holenne D. Lyn HollingsheadAlexander, MD

## 2016-11-04 ENCOUNTER — Inpatient Hospital Stay (HOSPITAL_COMMUNITY)
Admission: EM | Admit: 2016-11-04 | Discharge: 2016-11-16 | DRG: 291 | Disposition: A | Discharge status: Home / Self Care | Payer: Medicare Other | Attending: Family Medicine | Admitting: Family Medicine

## 2016-11-04 ENCOUNTER — Emergency Department (HOSPITAL_COMMUNITY): Payer: Medicare Other

## 2016-11-04 ENCOUNTER — Encounter (HOSPITAL_COMMUNITY): Payer: Self-pay | Admitting: Emergency Medicine

## 2016-11-04 DIAGNOSIS — I50813 Acute on chronic right heart failure: Secondary | ICD-10-CM | POA: Diagnosis not present

## 2016-11-04 DIAGNOSIS — I130 Hypertensive heart and chronic kidney disease with heart failure and stage 1 through stage 4 chronic kidney disease, or unspecified chronic kidney disease: Secondary | ICD-10-CM | POA: Diagnosis not present

## 2016-11-04 DIAGNOSIS — I2729 Other secondary pulmonary hypertension: Secondary | ICD-10-CM | POA: Diagnosis present

## 2016-11-04 DIAGNOSIS — Z66 Do not resuscitate: Secondary | ICD-10-CM | POA: Diagnosis present

## 2016-11-04 DIAGNOSIS — Z5320 Procedure and treatment not carried out because of patient's decision for unspecified reasons: Secondary | ICD-10-CM | POA: Diagnosis present

## 2016-11-04 DIAGNOSIS — I483 Typical atrial flutter: Secondary | ICD-10-CM | POA: Diagnosis not present

## 2016-11-04 DIAGNOSIS — I5043 Acute on chronic combined systolic (congestive) and diastolic (congestive) heart failure: Secondary | ICD-10-CM | POA: Diagnosis present

## 2016-11-04 DIAGNOSIS — Z9119 Patient's noncompliance with other medical treatment and regimen: Secondary | ICD-10-CM

## 2016-11-04 DIAGNOSIS — Z6841 Body Mass Index (BMI) 40.0 and over, adult: Secondary | ICD-10-CM

## 2016-11-04 DIAGNOSIS — I4892 Unspecified atrial flutter: Secondary | ICD-10-CM | POA: Diagnosis present

## 2016-11-04 DIAGNOSIS — J9621 Acute and chronic respiratory failure with hypoxia: Secondary | ICD-10-CM | POA: Diagnosis present

## 2016-11-04 DIAGNOSIS — Z794 Long term (current) use of insulin: Secondary | ICD-10-CM

## 2016-11-04 DIAGNOSIS — E872 Acidosis: Secondary | ICD-10-CM | POA: Diagnosis present

## 2016-11-04 DIAGNOSIS — I214 Non-ST elevation (NSTEMI) myocardial infarction: Secondary | ICD-10-CM

## 2016-11-04 DIAGNOSIS — I10 Essential (primary) hypertension: Secondary | ICD-10-CM | POA: Diagnosis present

## 2016-11-04 DIAGNOSIS — I5023 Acute on chronic systolic (congestive) heart failure: Secondary | ICD-10-CM

## 2016-11-04 DIAGNOSIS — D649 Anemia, unspecified: Secondary | ICD-10-CM | POA: Diagnosis not present

## 2016-11-04 DIAGNOSIS — I7389 Other specified peripheral vascular diseases: Secondary | ICD-10-CM | POA: Diagnosis present

## 2016-11-04 DIAGNOSIS — Z8249 Family history of ischemic heart disease and other diseases of the circulatory system: Secondary | ICD-10-CM

## 2016-11-04 DIAGNOSIS — J9622 Acute and chronic respiratory failure with hypercapnia: Secondary | ICD-10-CM | POA: Diagnosis present

## 2016-11-04 DIAGNOSIS — I4430 Unspecified atrioventricular block: Secondary | ICD-10-CM | POA: Diagnosis present

## 2016-11-04 DIAGNOSIS — R140 Abdominal distension (gaseous): Secondary | ICD-10-CM

## 2016-11-04 DIAGNOSIS — E119 Type 2 diabetes mellitus without complications: Secondary | ICD-10-CM

## 2016-11-04 DIAGNOSIS — Z79899 Other long term (current) drug therapy: Secondary | ICD-10-CM

## 2016-11-04 DIAGNOSIS — I5082 Biventricular heart failure: Secondary | ICD-10-CM | POA: Diagnosis present

## 2016-11-04 DIAGNOSIS — I248 Other forms of acute ischemic heart disease: Secondary | ICD-10-CM | POA: Diagnosis present

## 2016-11-04 DIAGNOSIS — J441 Chronic obstructive pulmonary disease with (acute) exacerbation: Secondary | ICD-10-CM | POA: Diagnosis present

## 2016-11-04 DIAGNOSIS — I4510 Unspecified right bundle-branch block: Secondary | ICD-10-CM | POA: Diagnosis present

## 2016-11-04 DIAGNOSIS — N184 Chronic kidney disease, stage 4 (severe): Secondary | ICD-10-CM | POA: Diagnosis not present

## 2016-11-04 DIAGNOSIS — E039 Hypothyroidism, unspecified: Secondary | ICD-10-CM | POA: Diagnosis present

## 2016-11-04 DIAGNOSIS — Z7982 Long term (current) use of aspirin: Secondary | ICD-10-CM

## 2016-11-04 DIAGNOSIS — R0902 Hypoxemia: Secondary | ICD-10-CM

## 2016-11-04 DIAGNOSIS — J189 Pneumonia, unspecified organism: Secondary | ICD-10-CM | POA: Diagnosis present

## 2016-11-04 DIAGNOSIS — F17210 Nicotine dependence, cigarettes, uncomplicated: Secondary | ICD-10-CM | POA: Diagnosis present

## 2016-11-04 DIAGNOSIS — D631 Anemia in chronic kidney disease: Secondary | ICD-10-CM | POA: Diagnosis present

## 2016-11-04 DIAGNOSIS — J9601 Acute respiratory failure with hypoxia: Secondary | ICD-10-CM | POA: Diagnosis present

## 2016-11-04 DIAGNOSIS — Z888 Allergy status to other drugs, medicaments and biological substances status: Secondary | ICD-10-CM

## 2016-11-04 DIAGNOSIS — Z882 Allergy status to sulfonamides status: Secondary | ICD-10-CM

## 2016-11-04 DIAGNOSIS — G4733 Obstructive sleep apnea (adult) (pediatric): Secondary | ICD-10-CM | POA: Diagnosis present

## 2016-11-04 DIAGNOSIS — I2781 Cor pulmonale (chronic): Secondary | ICD-10-CM | POA: Diagnosis present

## 2016-11-04 DIAGNOSIS — E875 Hyperkalemia: Secondary | ICD-10-CM | POA: Diagnosis not present

## 2016-11-04 DIAGNOSIS — R0602 Shortness of breath: Secondary | ICD-10-CM | POA: Diagnosis not present

## 2016-11-04 DIAGNOSIS — E876 Hypokalemia: Secondary | ICD-10-CM | POA: Diagnosis present

## 2016-11-04 DIAGNOSIS — E1122 Type 2 diabetes mellitus with diabetic chronic kidney disease: Secondary | ICD-10-CM | POA: Diagnosis present

## 2016-11-04 DIAGNOSIS — J440 Chronic obstructive pulmonary disease with acute lower respiratory infection: Secondary | ICD-10-CM | POA: Diagnosis present

## 2016-11-04 LAB — CBC WITH DIFFERENTIAL/PLATELET
BASOS ABS: 0 10*3/uL (ref 0.0–0.1)
Basophils Relative: 0 %
EOS ABS: 0.2 10*3/uL (ref 0.0–0.7)
EOS PCT: 3 %
HCT: 32.8 % — ABNORMAL LOW (ref 36.0–46.0)
Hemoglobin: 10.1 g/dL — ABNORMAL LOW (ref 12.0–15.0)
LYMPHS PCT: 15 %
Lymphs Abs: 1.1 10*3/uL (ref 0.7–4.0)
MCH: 26.7 pg (ref 26.0–34.0)
MCHC: 30.8 g/dL (ref 30.0–36.0)
MCV: 86.8 fL (ref 78.0–100.0)
Monocytes Absolute: 0.8 10*3/uL (ref 0.1–1.0)
Monocytes Relative: 12 %
Neutro Abs: 5 10*3/uL (ref 1.7–7.7)
Neutrophils Relative %: 70 %
PLATELETS: 240 10*3/uL (ref 150–400)
RBC: 3.78 MIL/uL — AB (ref 3.87–5.11)
RDW: 19.4 % — ABNORMAL HIGH (ref 11.5–15.5)
WBC: 7.1 10*3/uL (ref 4.0–10.5)

## 2016-11-04 LAB — BASIC METABOLIC PANEL
ANION GAP: 10 (ref 5–15)
BUN: 45 mg/dL — ABNORMAL HIGH (ref 6–20)
CO2: 28 mmol/L (ref 22–32)
Calcium: 8.5 mg/dL — ABNORMAL LOW (ref 8.9–10.3)
Chloride: 100 mmol/L — ABNORMAL LOW (ref 101–111)
Creatinine, Ser: 1.95 mg/dL — ABNORMAL HIGH (ref 0.44–1.00)
GFR calc non Af Amer: 24 mL/min — ABNORMAL LOW (ref >60)
GFR, EST AFRICAN AMERICAN: 27 mL/min — AB (ref >60)
Glucose, Bld: 74 mg/dL (ref 65–99)
POTASSIUM: 3.4 mmol/L — AB (ref 3.5–5.1)
SODIUM: 138 mmol/L (ref 135–145)

## 2016-11-04 LAB — HEMOGLOBIN A1C
Hgb A1c MFr Bld: 6.7 % — ABNORMAL HIGH (ref 4.8–5.6)
Mean Plasma Glucose: 145.59 mg/dL

## 2016-11-04 LAB — GLUCOSE, CAPILLARY
GLUCOSE-CAPILLARY: 178 mg/dL — AB (ref 65–99)
GLUCOSE-CAPILLARY: 129 mg/dL — AB (ref 65–99)

## 2016-11-04 LAB — TROPONIN I
TROPONIN I: 0.06 ng/mL — AB (ref <0.03)
TROPONIN I: 0.06 ng/mL — AB (ref <0.03)

## 2016-11-04 LAB — BRAIN NATRIURETIC PEPTIDE: B NATRIURETIC PEPTIDE 5: 360 pg/mL — AB (ref 0.0–100.0)

## 2016-11-04 MED ORDER — SODIUM CHLORIDE 0.9% FLUSH: 3.0000 mL | INTRAVENOUS | Status: DC | PRN

## 2016-11-04 MED ORDER — APIXABAN 5 MG PO TABS
5.0000 mg | ORAL_TABLET | Freq: Two times a day (BID) | ORAL | Status: DC
  Administered 2016-11-04 – 2016-11-09 (×10): 5 mg via ORAL
  Filled 2016-11-04 (×10): qty 1

## 2016-11-04 MED ORDER — CARVEDILOL 3.125 MG PO TABS: 3.1250 mg | ORAL_TABLET | Freq: Two times a day (BID) | ORAL | Status: DC

## 2016-11-04 MED ORDER — BISOPROLOL FUMARATE 5 MG PO TABS
10.0000 mg | ORAL_TABLET | Freq: Every day | ORAL | Status: DC
  Administered 2016-11-04 – 2016-11-10 (×6): 10 mg via ORAL
  Filled 2016-11-04 (×5): qty 1
  Filled 2016-11-04: qty 2
  Filled 2016-11-04 (×3): qty 1

## 2016-11-04 MED ORDER — ALBUTEROL SULFATE HFA 108 (90 BASE) MCG/ACT IN AERS: 2.0000 | INHALATION_SPRAY | Freq: Every day | RESPIRATORY_TRACT | Status: DC

## 2016-11-04 MED ORDER — ACETAMINOPHEN 325 MG PO TABS: 650.0000 mg | ORAL_TABLET | ORAL | Status: DC | PRN

## 2016-11-04 MED ORDER — POTASSIUM CHLORIDE CRYS ER 20 MEQ PO TBCR
40.0000 meq | EXTENDED_RELEASE_TABLET | Freq: Two times a day (BID) | ORAL | Status: DC
  Administered 2016-11-04 – 2016-11-06 (×5): 40 meq via ORAL
  Filled 2016-11-04 (×5): qty 2

## 2016-11-04 MED ORDER — FUROSEMIDE 10 MG/ML IJ SOLN: 120.0000 mg | Freq: Two times a day (BID) | INTRAVENOUS | Status: DC

## 2016-11-04 MED ORDER — FUROSEMIDE 10 MG/ML IJ SOLN
10.0000 mg/h | INTRAVENOUS | Status: DC
  Administered 2016-11-04: 10 mg/h via INTRAVENOUS
  Filled 2016-11-04 (×5): qty 25

## 2016-11-04 MED ORDER — INFLUENZA VAC SPLIT HIGH-DOSE 0.5 ML IM SUSY
0.5000 mL | PREFILLED_SYRINGE | INTRAMUSCULAR | Status: DC
  Filled 2016-11-04: qty 0.5

## 2016-11-04 MED ORDER — SODIUM CHLORIDE 0.9 % IV SOLN: 250.0000 mL | INTRAVENOUS | Status: DC | PRN

## 2016-11-04 MED ORDER — IPRATROPIUM-ALBUTEROL 0.5-2.5 (3) MG/3ML IN SOLN
3.0000 mL | Freq: Once | RESPIRATORY_TRACT | Status: AC
  Administered 2016-11-04: 3 mL via RESPIRATORY_TRACT
  Filled 2016-11-04: qty 3

## 2016-11-04 MED ORDER — ENOXAPARIN SODIUM 30 MG/0.3ML ~~LOC~~ SOLN: 30.0000 mg | SUBCUTANEOUS | Status: DC

## 2016-11-04 MED ORDER — INSULIN ASPART 100 UNIT/ML ~~LOC~~ SOLN
0.0000 [IU] | Freq: Three times a day (TID) | SUBCUTANEOUS | Status: DC
  Administered 2016-11-05 – 2016-11-06 (×2): 5 [IU] via SUBCUTANEOUS
  Administered 2016-11-06: 3 [IU] via SUBCUTANEOUS
  Administered 2016-11-06: 1 [IU] via SUBCUTANEOUS
  Administered 2016-11-07: 3 [IU] via SUBCUTANEOUS
  Administered 2016-11-07: 1 [IU] via SUBCUTANEOUS
  Administered 2016-11-07: 5 [IU] via SUBCUTANEOUS
  Administered 2016-11-08: 2 [IU] via SUBCUTANEOUS
  Administered 2016-11-08: 5 [IU] via SUBCUTANEOUS
  Administered 2016-11-08: 3 [IU] via SUBCUTANEOUS
  Administered 2016-11-09: 5 [IU] via SUBCUTANEOUS
  Administered 2016-11-09 – 2016-11-11 (×5): 2 [IU] via SUBCUTANEOUS
  Administered 2016-11-11 – 2016-11-12 (×3): 1 [IU] via SUBCUTANEOUS
  Administered 2016-11-12 (×2): 2 [IU] via SUBCUTANEOUS
  Administered 2016-11-13: 1 [IU] via SUBCUTANEOUS
  Administered 2016-11-13: 5 [IU] via SUBCUTANEOUS
  Administered 2016-11-14 – 2016-11-15 (×2): 3 [IU] via SUBCUTANEOUS
  Administered 2016-11-15: 2 [IU] via SUBCUTANEOUS
  Administered 2016-11-16: 3 [IU] via SUBCUTANEOUS
  Administered 2016-11-16: 2 [IU] via SUBCUTANEOUS
  Filled 2016-11-04: qty 1

## 2016-11-04 MED ORDER — ASPIRIN EC 81 MG PO TBEC
81.0000 mg | DELAYED_RELEASE_TABLET | Freq: Every day | ORAL | Status: DC
  Administered 2016-11-05 – 2016-11-11 (×7): 81 mg via ORAL
  Filled 2016-11-04 (×7): qty 1

## 2016-11-04 MED ORDER — INSULIN ASPART 100 UNIT/ML ~~LOC~~ SOLN
0.0000 [IU] | Freq: Every day | SUBCUTANEOUS | Status: DC
  Administered 2016-11-05: 4 [IU] via SUBCUTANEOUS
  Administered 2016-11-06: 3 [IU] via SUBCUTANEOUS
  Administered 2016-11-07 – 2016-11-08 (×2): 2 [IU] via SUBCUTANEOUS
  Administered 2016-11-10 – 2016-11-11 (×2): 3 [IU] via SUBCUTANEOUS
  Administered 2016-11-13: 5 [IU] via SUBCUTANEOUS

## 2016-11-04 MED ORDER — LEVOTHYROXINE SODIUM 25 MCG PO TABS
25.0000 ug | ORAL_TABLET | Freq: Every day | ORAL | Status: DC
  Administered 2016-11-05 – 2016-11-16 (×8): 25 ug via ORAL
  Filled 2016-11-04 (×11): qty 1

## 2016-11-04 MED ORDER — FUROSEMIDE 10 MG/ML IJ SOLN
120.0000 mg | Freq: Once | INTRAVENOUS | Status: AC
  Administered 2016-11-04: 120 mg via INTRAVENOUS
  Filled 2016-11-04: qty 12

## 2016-11-04 MED ORDER — GUAIFENESIN-DM 100-10 MG/5ML PO SYRP
5.0000 mL | ORAL_SOLUTION | ORAL | Status: DC | PRN
  Administered 2016-11-05 (×3): 5 mL via ORAL
  Filled 2016-11-04 (×3): qty 5

## 2016-11-04 MED ORDER — ONDANSETRON HCL 4 MG/2ML IJ SOLN: 4.0000 mg | Freq: Four times a day (QID) | INTRAMUSCULAR | Status: DC | PRN

## 2016-11-04 MED ORDER — HYDRALAZINE HCL 25 MG PO TABS
25.0000 mg | ORAL_TABLET | Freq: Three times a day (TID) | ORAL | Status: DC
  Administered 2016-11-04 – 2016-11-16 (×36): 25 mg via ORAL
  Filled 2016-11-04 (×36): qty 1

## 2016-11-04 MED ORDER — ALBUTEROL SULFATE (2.5 MG/3ML) 0.083% IN NEBU: 2.5000 mg | INHALATION_SOLUTION | Freq: Every day | RESPIRATORY_TRACT | Status: DC

## 2016-11-04 MED ORDER — DEXTROSE 5 % IV SOLN: 160.0000 mg | Freq: Two times a day (BID) | INTRAVENOUS | Status: DC

## 2016-11-04 MED ORDER — SODIUM CHLORIDE 0.9% FLUSH
3.0000 mL | Freq: Two times a day (BID) | INTRAVENOUS | Status: DC
  Administered 2016-11-05 – 2016-11-16 (×18): 3 mL via INTRAVENOUS

## 2016-11-04 NOTE — H&P (Addendum)
History and Physical    Sue Roach ZOX:[id number] DOB: 08-19-1940 DOA: 11/04/2016   PCP: Kaleen Mask, MD   Attending physician: Lowell Guitar  Patient coming from/Resides with: Private residence/alone  Chief Complaint: Shortness of breath  HPI: Sue Roach is a 76 y.o. female with medical history significant for obesity, chronic right-sided heart failure on diuretics with mild pulmonary HTN, DM 2 on oral agents, chronic lower extremity stasis dermatitis, CKD 4, chronic anemia and hypertension. Patient reports that 3 days ago she developed a scratchy, sore throat with a headache and persistent coughing of clear mucoid sputum. No fevers no chills. No significant change in chronic lower extremity edema. She did use a prescribed cream on her left lower extremity yesterday but her skin began to burn and she removed the cream and noticed a residual redness/erythema after removal of the cream. She went to her PCP office today for she was found to be hypoxemic. She does not work chronic oxygen. Upon presentation to the ER on room air her saturations were 60%. Chest x-ray revealed early edema without effusion and cardiomegaly. She was also found to be in atrial flutter. EDP had planned on placing the patient on BiPAP but after administration of Lasix her respiratory symptoms improved therefore initial order for stepdown unit was changed to telemetry.  ED Course:  Vital Signs: BP (!) 145/64   Pulse 74   Temp 98.3 F (36.8 C) (Oral)   Resp 18   SpO2 93%  2 view CXR: As above Lab data: Sodium 138, potassium 3.4, chloride 100, CO2 28, glucose 74, BUN 45, creatinine 1.95, anion gap 10, BNP 360, troponin 0.06, white count 7100 with hemoglobin 10.1, platelets 240,000, normal differential Medications and treatments: DuoNeb 1, Lasix 120 mg IV 1  Review of Systems:  In addition to the HPI above,  No Fever-chills, myalgias or other constitutional symptoms No Headache, changes with  Vision or hearing, new weakness, tingling, numbness in any extremity, dizziness, dysarthria or word finding difficulty, gait disturbance or imbalance, tremors or seizure activity No problems swallowing food or Liquids, indigestion/reflux, choking or coughing while eating, abdominal pain with or after eating No Chest pain, palpitations No Abdominal pain, N/V, melena,hematochezia, dark tarry stools, constipation No dysuria, malodorous urine, hematuria or flank pain No new skin rashes, lesions, masses or bruises, No new joint pains, aches, swelling  No recent unintentional weight gain or loss No polyuria, polydypsia or polyphagia   Past Medical History:  Diagnosis Date  . CHF (congestive heart failure) (HCC)   . Diabetes mellitus without complication (HCC)   . Hypertension   . Kidney disease   . Renal disorder   . Shingles     No past surgical history on file.  Social History   Social History  . Marital status: Widowed    Spouse name: N/A  . Number of children: N/A  . Years of education: N/A   Occupational History  . Not on file.   Social History Main Topics  . Smoking status: Current Every Day Smoker    Packs/day: 1.00    Types: Cigarettes  . Smokeless tobacco: Never Used  . Alcohol use No  . Drug use: No  . Sexual activity: Not on file   Other Topics Concern  . Not on file   Social History Narrative  . No narrative on file    Mobility: Rolling walker Work history: Not obtained   Allergies  Allergen Reactions  . Sulfa Antibiotics Other (See Comments)  unknown  . Coreg [Carvedilol] Rash    Hair loss  . Zinc Rash    Family History  Problem Relation Age of Onset  . Hypertension Mother   . Hypertension Maternal Grandfather      Prior to Admission medications   Medication Sig Start Date End Date Taking? Authorizing Provider  acetaminophen (TYLENOL) 325 MG tablet Take 2 tablets (650 mg total) by mouth every 4 (four) hours as needed for mild pain,  moderate pain, fever or headache. 02/05/16   Hongalgi, Maximino Greenland, MD  aspirin EC 81 MG tablet Take 81 mg by mouth daily.    [provider]  docusate sodium (COLACE) 100 MG capsule Take 100 mg by mouth 2 (two) times daily as needed for constipation.    [provider]  furosemide (LASIX) 80 MG tablet Take 1.5 tablets (120 mg total) by mouth 2 (two) times daily. 02/05/16   Hongalgi, Maximino Greenland, MD  hydrALAZINE (APRESOLINE) 25 MG tablet Take 25 mg by mouth 3 (three) times daily. 11/04/15   [provider]  insulin aspart (NOVOLOG) 100 UNIT/ML injection Inject 0-9 Units into the skin 3 (three) times daily with meals. CBG < 70: implement hypoglycemia protocol CBG 70 - 120: 0 units CBG 121 - 150: 1 unit CBG 151 - 200: 2 units CBG 201 - 250: 3 units CBG 251 - 300: 5 units CBG 301 - 350: 7 units CBG 351 - 400: 9 units CBG > 400: call MD. 02/05/16   Elease Etienne, MD  levothyroxine (SYNTHROID, LEVOTHROID) 25 MCG tablet Take 1 tablet (25 mcg total) by mouth daily before breakfast. 02/06/16   Hongalgi, Theadora Rama D, MD  METOPROLOL TARTRATE PO Take 6.25 mg by mouth 2 (two) times daily.    [provider]  potassium chloride SA (K-DUR,KLOR-CON) 20 MEQ tablet Take 2 tablets (40 mEq total) by mouth 2 (two) times daily. 02/05/16   Elease Etienne, MD    Physical Exam: Vitals:   11/04/16 1430 11/04/16 1445 11/04/16 1515 11/04/16 1545  BP: (!) 179/72 (!) 172/66 (!) 151/69 (!) 145/64  Pulse: 62 63 63 74  Resp: Temp:      TempSrc:      SpO2:  98%  93%      Constitutional: NAD, calm, comfortable Eyes: PERRL, lids and conjunctivae normal ENMT: Mucous membranes are moist. Posterior pharynx clear of any exudate or lesions.Normal dentition.  Neck: normal, supple, no masses, no thyromegaly Respiratory: Coarse inspiratory and expiratory crackles. Normal respiratory effort without accessory muscle use at rest. 6 L oxygen Cardiovascular: Regular rate and rhythm,  no murmurs / rubs / gallops. Bilateral 2-3+ lower extremity extremity edema with left greater than right. 2+ pedal pulses. No carotid bruits.  Abdomen: no tenderness, no masses palpated. No hepatosplenomegaly. Bowel sounds positive.  Musculoskeletal: no clubbing / cyanosis. No joint deformity upper and lower extremities. Good ROM, no contractures. Normal muscle tone.  Skin: no rashes, lesions, ulcers. No induration-erythema in a circumferential pattern involving the left foot and ankle Neurologic: CN 2-12 grossly intact. Sensation intact, DTR normal. Strength 5/5 x all 4 extremities.  Psychiatric: Normal judgment and insight. Alert and oriented x 3. Normal mood.    Labs on Admission: I have personally reviewed following labs and imaging studies  CBC:  Recent Labs Lab 11/04/16 1315  WBC 7.1  NEUTROABS 5.0  HGB 10.1*  HCT 32.8*  MCV 86.8  PLT 240   Basic Metabolic Panel:  Recent Labs Lab  11/04/16 1315  NA 138  K 3.4*  CL 100*  CO2 28  GLUCOSE 74  BUN 45*  CREATININE 1.95*  CALCIUM 8.5*   GFR: CrCl cannot be calculated (Unknown ideal weight.). Liver Function Tests: No results for input(s): AST, ALT, ALKPHOS, BILITOT, PROT, ALBUMIN in the last 168 hours. No results for input(s): LIPASE, AMYLASE in the last 168 hours. No results for input(s): AMMONIA in the last 168 hours. Coagulation Profile: No results for input(s): INR, PROTIME in the last 168 hours. Cardiac Enzymes:  Recent Labs Lab 11/04/16 1412  TROPONINI 0.06*   BNP (last 3 results) No results for input(s): PROBNP in the last 8760 hours. HbA1C: No results for input(s): HGBA1C in the last 72 hours. CBG: No results for input(s): GLUCAP in the last 168 hours. Lipid Profile: No results for input(s): CHOL, HDL, LDLCALC, TRIG, CHOLHDL, LDLDIRECT in the last 72 hours. Thyroid Function Tests: No results for input(s): TSH, T4TOTAL, FREET4, T3FREE, THYROIDAB in the last 72 hours. Anemia Panel: No results for  input(s): VITAMINB12, FOLATE, FERRITIN, TIBC, IRON, RETICCTPCT in the last 72 hours. Urine analysis:    Component Value Date/Time   COLORURINE YELLOW 01/29/2016 0508   APPEARANCEUR CLEAR 01/29/2016 0508   LABSPEC 1.009 01/29/2016 0508   PHURINE 5.0 01/29/2016 0508   GLUCOSEU NEGATIVE 01/29/2016 0508   HGBUR SMALL (A) 01/29/2016 0508   BILIRUBINUR NEGATIVE 01/29/2016 0508   KETONESUR NEGATIVE 01/29/2016 0508   PROTEINUR 100 (A) 01/29/2016 0508   NITRITE NEGATIVE 01/29/2016 0508   LEUKOCYTESUR SMALL (A) 01/29/2016 0508   Sepsis Labs: (procalcitonin:4,lacticidven:4) )No results found for this or any previous visit (from the past 240 hour(s)).   Radiological Exams on Admission: Dg Chest 2 View  Result Date: 11/04/2016 CLINICAL DATA:  76 year old female with a history of decreased oxygenation EXAM: CHEST  2 VIEW COMPARISON:  02/04/2016, 01/28/2016 FINDINGS: Cardiomediastinal silhouette again enlarged with cardiomegaly. Calcifications of the aortic arch. No pneumothorax. Interlobular septal thickening with coarsened interstitial markings. No large pleural effusion. No confluent airspace disease. Degenerative changes of the spine.  No displaced fracture IMPRESSION: Evidence of early edema with no large pleural effusion. Cardiomegaly. Electronically Signed   By: Gilmer Mor D.O.   On: 11/04/2016 13:50    EKG: (Independently reviewed) Atrial flutter which is new since previous EKG from December 2017 with ventricular rate of 63 bpm, TC 560 ms in setting of underlying right bundle branch block  Assessment/Plan Principal Problem:    Acute respiratory failure with hypoxemia 2/2 Acute on chronic right-sided congestive heart failure/Pulmonary HTN and OSA -Presents with 3 days of cough headache and shortness of breath with x-ray findings of edema with mild elevation in BNP and troponin concerning acute heart failure exacerbation -On very high-dose Lasix at home-will give Lasix 60 mg IV  every 12 hours with potassium supplementation -continue carvedilol during acute exacerbation -No ACE I 2/2 CKD 4 -Continue supportive care with oxygen -Daily weights, strict I/O -Last echocardiogram completed December 2017: Preserved LV function, moderate LVH, moderate decreased right systolic function, mild pulmonary hypertension 36 mmHg, mild tricuspid regurgitation - repeat echo -Suspect mild elevation in troponin secondary to acute heart failure exacerbation-cardiology consulted by EDP-continue to cycle troponin  Active Problems:   Atrial flutter/RBBB -New onset and rate controlled -Likely related to underlying pulmonary issues/right atrial dilatation and acute hypoxia -Cardiology consultation pending -CHASDVASc=6 -Consider initiating Coumadin anticoagulation-I have discussed briefly with the patient but requests both cardiology and pharmacy discuss further i.e. Risk vs benefits-unable to utilize NOAC  secondary to severe CKD    Diabetes mellitus without complication  -discontinue Actos in setting of heart failure -hold Amaryl -SSI -HgbA1c    Hypertension, uncontrolled -Likely secondary to acute heart exacerbation -Continue hydralazine -Likely can resume carvedilol once proves hemodynamically stable with aggressive IV diuresis    CKD (chronic kidney disease) stage 4, GFR 15-29 ml/min -Renal function stable and at baseline for patient    COPD -Not actively wheezing and symptoms more suggestive of CHF exacerbation -Continue preadmission Ventolin inhaler -Given paroxysmal coughing may have viral syndrome so check respiratory viral panel -Patient reports has never had coughing episode like this before and has had several breathing treatments over the past 2 days which have not improved her symptoms    Hypothyroidism, adult -Continue Synthroid    Chronic anemia -Hemoglobin stable and at baseline   L leg redness/erythema:  Sounds like reaction to cream she used from her PCP.   Would continue to monitor.   Prolonged QT:   AM EKG   DVT prophylaxis: Lovenox dose adjusted for CKD-if plan to initiate full dose anticoagulation for atrial flutter can increase Lovenox dosage Code Status: DO NOT RESUSCITATE Family Communication: No family at bedside Disposition Plan: Home Consults called: Cardiology    ELLIS,ALLISON L. ANP-BC [facility] Pager [phone]   If 7PM-7AM, please contact night-coverage [email or web address] Password [facility]  11/04/2016, 4:27 PM   Patient independently seen and examined. Treatment plan discussed with physician extender. I have directly reviewed the clinical findings, lab, imaging studies and management of this patient in detail. I have made the necessary changes to the above noted documentation, and agree with the documentation, as recorded by the Physician extender.  A/P Acute hypoxic respiratory failure likely due to heart failure:  Previous echo with normal EF 50-55% as well as elevated PASP noted.  Will order repeat echo.  Lasix as below.   Will continue carvedilol.  Cycling troponins, but likely mild elevation due to demand ischemia from HF.  Her viral illness may have been the predisposing factor for her exacerbation.  Aflutter:  High chadsvasc6.  Cards c/s as below.  Will consult care manager regarding prior auth for eliquis.   Prolonged QT: Repeat AM EKG   DM: SSI, hold home hypoglycemics. A1C.  Discontinue actos given above.   HTN: home hydral and carvedilol  Other medical problems as noted in APP note  Lacretia Nicks

## 2016-11-04 NOTE — ED Notes (Signed)
Unsuccessful attempt at giving report to 4E. 

## 2016-11-04 NOTE — ED Notes (Signed)
Pt given something to drink and gram crackers.

## 2016-11-04 NOTE — ED Notes (Signed)
[phone] (Phoebe-Daughter)

## 2016-11-04 NOTE — ED Triage Notes (Signed)
Pt sent from PCP for O2 in the 60s, sent with O2 but pt left it in the car. Started out as post nasal drip turned into cough.

## 2016-11-04 NOTE — ED Notes (Signed)
Per EDP, patient is unable to eat or drink at this time.

## 2016-11-04 NOTE — ED Provider Notes (Signed)
[facility]-EMERGENCY DEPT Provider Note   CSN: [id number] Arrival date & time: 11/04/16  1307     History   Chief Complaint Chief Complaint  Patient presents with  . Shortness of Breath    HPI Sue Roach is a 76 y.o. female.  Patient is a 76 year old female who presents for shortness of breath. She has a history of congestive heart failure, COPD, obstructive sleep apnea, chronic kidney disease, diabetes, chronic anemia and atrial flutter. She's not on home oxygen. She denies any nebulizer use at home. She states that 3 days ago she started having URI symptoms with scratchy throat and runny nose. She's had an ongoing cough which she feels is making her short of breath. She was seen by her PCP this morning and noted to have an oxygen saturation in the 60s and was sent here for further evaluation. She has chronic leg swelling which she says is unchanged from her baseline. She does have some increased erythema of her left lower leg as compared to her right. She denies any fevers. No chest pain. No productive cough. No vomiting or diarrhea. She was seen by EMS last night who gave her a nebulizer treatment which she says helped for a few minutes but she did not want to be transported at that point.    She was recently admitted in December 2017 for respiratory failure. She's currently on Lasix 120 mg twice a day. She had an echocardiogram during that admission which showed an EF of 50-55% with mild LVH.      Past Medical History:  Diagnosis Date  . CHF (congestive heart failure) (HCC)   . Diabetes mellitus without complication (HCC)   . Hypertension   . Kidney disease   . Renal disorder   . Shingles     Patient Active Problem List   Diagnosis Date Noted  . Acute on chronic right-sided congestive heart failure (HCC) 11/04/2016  . Diabetes mellitus without complication (HCC) 11/04/2016  . Hypertension, uncontrolled 11/04/2016  . Hypothyroidism, adult 11/04/2016  . Acute  respiratory failure with hypoxemia (HCC) 11/04/2016  . CKD (chronic kidney disease) stage 4, GFR 15-29 ml/min (HCC) 11/04/2016  . Chronic anemia 11/04/2016  . Acute on chronic respiratory failure with hypoxemia (HCC) 02/15/2016  . 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 Hypothyroidism 02/15/2016  . Atrial flutter, paroxysmal (HCC) 02/15/2016  . Shingles   . Kidney disease   . CHF exacerbation (HCC)   . Advance care planning   . Palliative care by specialist   . Goals of care, counseling/discussion   . COPD exacerbation (HCC)   . Obstructive sleep apnea   . Obesity hypoventilation syndrome (HCC)   . Chronic systolic CHF (congestive heart failure) (HCC)   . Dilated cardiomyopathy (HCC)   . Pulmonary hypertension (HCC)   . Controlled diabetes mellitus type 2 with complications (HCC)   . Acute renal failure with acute tubular necrosis superimposed on stage 2 chronic kidney disease (HCC)   . CHF (congestive heart failure) (HCC) 01/27/2016  . Acute respiratory failure with hypoxia (HCC) 01/27/2016  . Chronic kidney disease 01/27/2016  . Diabetes mellitus, type II (HCC) 01/27/2016  . Leg wound, left 01/27/2016  . Anemia of chronic disease 01/27/2016  . Tobacco abuse 01/27/2016  . Diabetes mellitus with complication (HCC)   . Impetigo 05/29/2015  . Morbid obesity due to excess calories (HCC) 05/29/2015  . Acute on chronic diastolic congestive heart failure (HCC) 05/29/2015    No past  surgical history on file.  OB History    No data available       Home Medications    Prior to Admission medications   Medication Sig Start Date End Date Taking? Authorizing Provider  acetaminophen (TYLENOL) 325 MG tablet Take 2 tablets (650 mg total) by mouth every 4 (four) hours as needed for mild pain, moderate pain, fever or headache. 02/05/16  Yes Hongalgi, Maximino Greenland, MD  aspirin EC 81 MG tablet Take 81 mg by mouth daily.   Yes [provider]    carvedilol (COREG) 3.125 MG tablet Take 3.125 mg by mouth 2 (two) times daily. 09/19/16  Yes [provider]  docusate sodium (COLACE) 100 MG capsule Take 100 mg by mouth 2 (two) times daily as needed for constipation.   Yes [provider]  furosemide (LASIX) 80 MG tablet Take 1.5 tablets (120 mg total) by mouth 2 (two) times daily. Patient taking differently: Take 160 mg by mouth 2 (two) times daily.  02/05/16  Yes Hongalgi, Maximino Greenland, MD  glimepiride (AMARYL) 4 MG tablet Take 4 mg by mouth daily. 11/02/16  Yes [provider]  hydrALAZINE (APRESOLINE) 25 MG tablet Take 25 mg by mouth 3 (three) times daily. 11/04/15  Yes [provider]  pioglitazone (ACTOS) 45 MG tablet Take 45 mg by mouth daily. 09/08/16  Yes [provider]  VENTOLIN HFA 108 (90 Base) MCG/ACT inhaler Inhale 2 puffs into the lungs daily. 11/03/16  Yes [provider]    Family History Family History  Problem Relation Age of Onset  . Hypertension Mother   . Hypertension Maternal Grandfather     Social History Social History  Substance Use Topics  . Smoking status: Current Every Day Smoker    Packs/day: 1.00    Types: Cigarettes  . Smokeless tobacco: Never Used  . Alcohol use No     Allergies   Sulfa antibiotics; Coreg [carvedilol]; and Zinc   Review of Systems Review of Systems  Constitutional: Negative for chills, diaphoresis, fatigue and fever.  HENT: Positive for rhinorrhea. Negative for congestion and sneezing.   Eyes: Negative.   Respiratory: Positive for cough and shortness of breath. Negative for chest tightness.   Cardiovascular: Positive for leg swelling. Negative for chest pain.  Gastrointestinal: Negative for abdominal pain, blood in stool, diarrhea, nausea and vomiting.  Genitourinary: Negative for difficulty urinating, flank pain, frequency and hematuria.  Musculoskeletal: Negative for arthralgias and back pain.  Skin: Negative for rash.   Neurological: Negative for dizziness, speech difficulty, weakness, numbness and headaches.     Physical Exam Updated Vital Signs BP (!) 145/64   Pulse 74   Temp 98.3 F (36.8 C) (Oral)   Resp 18   SpO2 93%   Physical Exam  Constitutional: She is oriented to person, place, and time. She appears well-developed and well-nourished.  HENT:  Head: Normocephalic and atraumatic.  Eyes: Pupils are equal, round, and reactive to light.  Neck: Normal range of motion. Neck supple.  Cardiovascular: Normal rate, regular rhythm and normal heart sounds.   Pulmonary/Chest: Effort normal. No respiratory distress. She has wheezes. She has rales. She exhibits no tenderness.  Abdominal: Soft. Bowel sounds are normal. There is no tenderness. There is no rebound and no guarding.  Musculoskeletal: Normal range of motion. She exhibits edema.  3+ pitting edema bilaterally with some chronic skin changes. There is also some erythema to the anterior portion of her left lower leg  Lymphadenopathy:    She  has no cervical adenopathy.  Neurological: She is alert and oriented to person, place, and time.  Skin: Skin is warm and dry. No rash noted.  Psychiatric: She has a normal mood and affect.     ED Treatments / Results  Labs (all labs ordered are listed, but only abnormal results are displayed) Labs Reviewed  CBC WITH DIFFERENTIAL/PLATELET - Abnormal; Notable for the following:       Result Value   RBC 3.78 (*)    Hemoglobin 10.1 (*)    HCT 32.8 (*)    RDW 19.4 (*)    All other components within normal limits  BASIC METABOLIC PANEL - Abnormal; Notable for the following:    Potassium 3.4 (*)    Chloride 100 (*)    BUN 45 (*)    Creatinine, Ser 1.95 (*)    Calcium 8.5 (*)    GFR calc non Af Amer 24 (*)    GFR calc Af Amer 27 (*)    All other components within normal limits  BRAIN NATRIURETIC PEPTIDE - Abnormal; Notable for the following:    B Natriuretic Peptide 360.0 (*)    All other components  within normal limits  TROPONIN I - Abnormal; Notable for the following:    Troponin I 0.06 (*)    All other components within normal limits  TROPONIN I  TROPONIN I  HEMOGLOBIN A1C    EKG  EKG Interpretation  Date/Time:  Friday November 04 2016 13:55:59 EDT Ventricular Rate:  63 PR Interval:    QRS Duration: 155 QT Interval:  504 QTC Calculation: 516 R Axis:   63 Text Interpretation:  Atrial flutter with predominant 4:1 AV block Right bundle branch block Confirmed by Rolan Bucco [phone]) on 11/04/2016 2:16:32 PM       Radiology Dg Chest 2 View  Result Date: 11/04/2016 CLINICAL DATA:  76 year old female with a history of decreased oxygenation EXAM: CHEST  2 VIEW COMPARISON:  02/04/2016, 01/28/2016 FINDINGS: Cardiomediastinal silhouette again enlarged with cardiomegaly. Calcifications of the aortic arch. No pneumothorax. Interlobular septal thickening with coarsened interstitial markings. No large pleural effusion. No confluent airspace disease. Degenerative changes of the spine.  No displaced fracture IMPRESSION: Evidence of early edema with no large pleural effusion. Cardiomegaly. Electronically Signed   By: Gilmer Mor D.O.   On: 11/04/2016 13:50    Procedures Procedures (including critical care time)  Medications Ordered in ED Medications  furosemide (LASIX) 120 mg in dextrose 5 % 50 mL IVPB (120 mg Intravenous New Bag/Given 11/04/16 1555)  aspirin EC tablet 81 mg (not administered)  hydrALAZINE (APRESOLINE) tablet 25 mg (not administered)  potassium chloride SA (K-DUR,KLOR-CON) CR tablet 40 mEq (not administered)  furosemide (LASIX) 160 mg in dextrose 5 % 50 mL IVPB (not administered)  insulin aspart (novoLOG) injection 0-9 Units (not administered)  insulin aspart (novoLOG) injection 0-5 Units (not administered)  ipratropium-albuterol (DUONEB) 0.5-2.5 (3) MG/3ML nebulizer solution 3 mL (3 mLs Nebulization Given 11/04/16 1433)     Initial Impression / Assessment and  Plan / ED Course  I have reviewed the triage vital signs and the nursing notes.  Pertinent labs & imaging results that were available during my care of the patient were reviewed by me and considered in my medical decision making (see chart for details).  Clinical Course as of Nov 05 1615  Fri Nov 04, 2016  1609 Pt's sats were in mid 28s on 6LPM.  Ordered Bipap but pt refusing.  Sats better, in lower 90s  [  MB]    Clinical Course User Index [MB] Rolan Bucco, MD    Patient is a 76 year old female who presents with respiratory distress. She appears to likely have a combination of CHF and COPD. There is no evidence of pneumonia. She has an elevated BNP as well as a mildly elevated troponin. She is in a flutter at a controlled rate. She is not on anticoagulants and has refused these in the past. She remains hypoxic despite nasal cannula oxygen. She was given a nebulizer treatment with minimal improvement.I counseled her on the need for BiPAP but is point she's refusing. Her saturations are staying in the upper 80s/low 90s. She is talking in full sentences. She was given a dose of Lasix in the ED. I spoke with cardiology (card master) who will see the patient. I spoke with the hospitalist to admit the patientto stepdown.  CRITICAL CARE Performed by: Eloni Darius Total critical care time: 45 minutes Critical care time was exclusive of separately billable procedures and treating other patients. Critical care was necessary to treat or prevent imminent or life-threatening deterioration. Critical care was time spent personally by me on the following activities: development of treatment plan with patient and/or surrogate as well as nursing, discussions with consultants, evaluation of patient's response to treatment, examination of patient, obtaining history from patient or surrogate, ordering and performing treatments and interventions, ordering and review of laboratory studies, ordering and review of  radiographic studies, pulse oximetry and re-evaluation of patient's condition.   Final Clinical Impressions(s) / ED Diagnoses   Final diagnoses:  Acute respiratory failure with hypoxia (HCC)  Acute on chronic systolic congestive heart failure (HCC)  NSTEMI (non-ST elevated myocardial infarction) [facility])    New Prescriptions New Prescriptions   No medications on file     Rolan Bucco, MD 11/04/16 [phone]

## 2016-11-04 NOTE — Consult Note (Signed)
Cardiology Consultation:   Patient ID: Sue Roach; [id number]; 01/04/1941   Admit date: 11/04/2016 Date of Consult: 11/04/2016  Primary Care Provider: Kaleen Mask, MD Primary Cardiologist: Dr. Herbie Baltimore    Patient Profile:   Sue Roach is a 76 y.o. female with a hx of chronic diastolic HF, DM, HTN and CKD who is being seen today for the evaluation of acute on chronic CHF and new atrial flutter, at the request of [facility].   History of Present Illness:   Pt was admitted in Dec 2017 with acute hypoxic respiratory failure in the setting of acute diastolic HF. Per hospital records, patient presented with a 20 lb weight gain above her baseline. Echo 01/26/17 showed normal LVEF at 50-55%, with increased wall thickness in a pattern moderate LVH. PA peak pressure was mildly increased at 36 mm Hg. She was treated with IV lasix and diuresed aggressively. Nephrology was consulted to assist with management of her diuretics. She was discharged home on high dose BID lasix, 120 mg BID with directions to adjust dose based on home weights. Her discharge weight previous admission was 255 lb by hospital scales.   I do not see where the patient has followed-up in our office since her last hospitalization.   She now presents back to the ED with cough, dyspnea and hypoxia. Pt was noted at PCP office to be hypoxic with O2 sats in the 60s and was sent to the ED. Pt declined Bipap. She was placed on [place] and given IV Lasix, which resulted in symptomatic improvement. O2 sats improved. BNP abnormal at 360. CXR with cardiomegaly and evidence of early edema. BP high in the 170s systolic but has improved some, now in the 150s systolic. CBC with mild anemia with hgb at 10.1. WBC WNL. BMP with renal insuffiencey at with SCr at 1.95 and BUN at 45. Baseline SCr is ~1.8-1.9.  Troponin 0.06. IM has been asked to admit. Pt has been started on IV Lasix, 160 mg BID. Supplemental K ordered.  Hydralazine 25 mg TID also ordered.   Of note, EKG also shows atrial flutter with CVR, which appears to be new. Previous EKG have shown PACs.  It was outlined in previous hospital records that patient was noted to have apnea during sleep but pt has refused to undergo formal sleep study to officially diagnose OSA.   She is asymptomatic with her aflutter. No palpitations. She denies Cp. No dizziness. She reports compliance with lasix and home and states that she tries to refrain from sodium but occasionally will put salt on eggs.   She denies fever and chills. Pt is currently on droplet precaution. Respiratory panel pending.     Past Medical History:  Diagnosis Date  . CHF (congestive heart failure) (HCC)   . Diabetes mellitus without complication (HCC)   . Hypertension   . Kidney disease   . Renal disorder   . Shingles     No past surgical history on file.   Home Medications:  Prior to Admission medications   Medication Sig Start Date End Date Taking? Authorizing Provider  acetaminophen (TYLENOL) 325 MG tablet Take 2 tablets (650 mg total) by mouth every 4 (four) hours as needed for mild pain, moderate pain, fever or headache. 02/05/16  Yes Hongalgi, Maximino Greenland, MD  aspirin EC 81 MG tablet Take 81 mg by mouth daily.   Yes [provider]  carvedilol (COREG) 3.125 MG tablet Take 3.125 mg by mouth 2 (two) times daily. 09/19/16  Yes [provider]  docusate sodium (COLACE) 100 MG capsule Take 100 mg by mouth 2 (two) times daily as needed for constipation.   Yes [provider]  furosemide (LASIX) 80 MG tablet Take 1.5 tablets (120 mg total) by mouth 2 (two) times daily. Patient taking differently: Take 160 mg by mouth 2 (two) times daily.  02/05/16  Yes Hongalgi, Maximino Greenland, MD  glimepiride (AMARYL) 4 MG tablet Take 4 mg by mouth daily. 11/02/16  Yes [provider]  hydrALAZINE (APRESOLINE) 25 MG tablet Take 25 mg by mouth 3 (three) times daily. 11/04/15  Yes  [provider]  pioglitazone (ACTOS) 45 MG tablet Take 45 mg by mouth daily. 09/08/16  Yes [provider]  VENTOLIN HFA 108 (90 Base) MCG/ACT inhaler Inhale 2 puffs into the lungs daily. 11/03/16  Yes [provider]    Inpatient Medications: Scheduled Meds: . [START ON 11/05/2016] aspirin EC  81 mg Oral Daily  . hydrALAZINE  25 mg Oral TID  . insulin aspart  0-5 Units Subcutaneous QHS  . insulin aspart  0-9 Units Subcutaneous TID WC  . potassium chloride SA  40 mEq Oral BID   Continuous Infusions: . furosemide 120 mg (11/04/16 1555)  . furosemide     PRN Meds:   Allergies:    Allergies  Allergen Reactions  . Sulfa Antibiotics Other (See Comments)    unknown  . Coreg [Carvedilol] Rash    Hair loss  . Zinc Rash    Social History:   Social History   Social History  . Marital status: Widowed    Spouse name: N/A  . Number of children: N/A  . Years of education: N/A   Occupational History  . Not on file.   Social History Main Topics  . Smoking status: Current Every Day Smoker    Packs/day: 1.00    Types: Cigarettes  . Smokeless tobacco: Never Used  . Alcohol use No  . Drug use: No  . Sexual activity: Not on file   Other Topics Concern  . Not on file   Social History Narrative  . No narrative on file    Family History:    Family History  Problem Relation Age of Onset  . Hypertension Mother   . Hypertension Maternal Grandfather      ROS:  Please see the history of present illness.  ROS  All other ROS reviewed and negative.     Physical Exam/Data:   Vitals:   11/04/16 1430 11/04/16 1445 11/04/16 1515 11/04/16 1545  BP: (!) 179/72 (!) 172/66 (!) 151/69 (!) 145/64  Pulse: 62 63 63 74  Resp: Temp:      TempSrc:      SpO2:  98%  93%   No intake or output data in the 24 hours ending 11/04/16 1625 There were no vitals filed for this visit. There is no height or weight on file to calculate BMI.  General:  Well  nourished, well developed, in no acute distress, obese  HEENT: normal Lymph: no adenopathy Neck: no JVD Endocrine:  No thryomegaly Vascular: No carotid bruits; FA pulses 2+ bilaterally without bruits  Cardiac:  Irregular rthythm; no murmur  Lungs: decreased BS at the baseline with faint bibasilar rales  Abd: soft, nontender, no hepatomegaly  Ext: 2+ bilateral pitting edema + venous stasis dermatitis  Musculoskeletal:  No deformities, BUE and BLE strength normal and equal Skin: warm and dry  Neuro:  CNs  2-12 intact, no focal abnormalities noted Psych:  Normal affect   EKG:  The EKG was personally reviewed and demonstrates:  Atrial flutter w/ CVR Telemetry:  Telemetry was personally reviewed and demonstrates:  Atrial flutter in the 110s.   Relevant CV Studies: 2D echo 01/2016 Study Conclusions  - Left ventricle: Wall thickness was increased in a pattern of   moderate LVH. Systolic function was normal. The estimated   ejection fraction was in the range of 50% to 55%. - Aortic valve: Mildly calcified annulus. - Right ventricle: The cavity size was moderately dilated. Systolic   function was moderately reduced. - Right atrium: The atrium was mildly to moderately dilated. - Pulmonary arteries: Systolic pressure was mildly increased. PA   peak pressure: 36 mm Hg (S).  Impressions:  - Technically difficult study. Laboratory Data:  Chemistry Recent Labs Lab 11/04/16 1315  NA 138  K 3.4*  CL 100*  CO2 28  GLUCOSE 74  BUN 45*  CREATININE 1.95*  CALCIUM 8.5*  GFRNONAA 24*  GFRAA 27*  ANIONGAP 10    No results for input(s): PROT, ALBUMIN, AST, ALT, ALKPHOS, BILITOT in the last 168 hours. Hematology Recent Labs Lab 11/04/16 1315  WBC 7.1  RBC 3.78*  HGB 10.1*  HCT 32.8*  MCV 86.8  MCH 26.7  MCHC 30.8  RDW 19.4*  PLT 240   Cardiac Enzymes Recent Labs Lab 11/04/16 1412  TROPONINI 0.06*   No results for input(s): TROPIPOC in the last 168 hours.  BNP Recent  Labs Lab 11/04/16 1404  BNP 360.0*    DDimer No results for input(s): DDIMER in the last 168 hours.  Radiology/Studies:  Dg Chest 2 View  Result Date: 11/04/2016 CLINICAL DATA:  76 year old female with a history of decreased oxygenation EXAM: CHEST  2 VIEW COMPARISON:  02/04/2016, 01/28/2016 FINDINGS: Cardiomediastinal silhouette again enlarged with cardiomegaly. Calcifications of the aortic arch. No pneumothorax. Interlobular septal thickening with coarsened interstitial markings. No large pleural effusion. No confluent airspace disease. Degenerative changes of the spine.  No displaced fracture IMPRESSION: Evidence of early edema with no large pleural effusion. Cardiomegaly. Electronically Signed   By: Gilmer Mor D.O.   On: 11/04/2016 13:50    Assessment and Plan:   1. Acute on Chronic Diastolic HF: pt with prior h/o acute diastolic HF requiring admission for IV diuretics (01/2016). Last echo 01/2016 showed normal LVEF with LVH and diastolic dysfunction. Previous discharge weight was 255 lb. Pt returns with increased dyspnea, hypoxia, bilateral LEE, abnormal BNP at 360 and CXR c/w early pulmonary edema, in the setting of new atrial flutter. Agree with IV diuretics, Lasix 160 mg BID. Monitor UOP. Strict I/Os and track daily weights. Low sodium diet. Repeat echo to check for changes in LVEF. Keep BP controlled. Monitor electrolyte and repleat K as needed.   2. Atrial Flutter: new onset. VR in the low 100s. Can continue home BB, Coreg as outpatient. Can try IV Cardizem if rate becomes difficult to control. Pt is hypokalemic at 3.4. Supplemental K-dur 40 mEq ordered. Check Mg level and TSH. Obtain 2D echo to assess LVF and LA size. May consider TEE/Cardioversion this admission. LA size was noted to be normal by echo last year, so pt should have success with cardioversion. CHA2DS2 VASc score is at least 6 for CHF, HTN, DM, Age >66 and female sex. This patients CHA2DS2-VASc Score and unadjusted  Ischemic Stroke Rate (% per year) is equal to 9.7 % stroke rate/year from a score of 6. SCr  however is >1.5. May consider 5 mg of Eliquis BID vs Coumadin. Recommend CM consult to check cost of DOAC vs Coumadin. Pt also has anemia with Hgb at 10, which will need to be taken into consideration and monitored if [facility] is elected. Hgb has been as low as 8.7 in past. She denies h/o abnormal bleeding. There is also concern regarding underling OSA (per previous hospital notes). Pt will need outpatient sleep study to r/o OSA and CPAP to treat if diagnosed. Monitor on telemetry.   3. Droplet Precaution: respiratory panel pending.   For questions or updates, please contact [facility] Please consult [email or web address] for contact info under Cardiology/STEMI.   Signed, Robbie Lis, PA-C  11/04/2016 4:25 PM   I have seen and examined the patient along with Robbie Lis, PA-C .  I have reviewed the chart, notes and new data.  I agree with PA's note.  Key new complaints: Biggest complaint is extreme shortness of breath during coughing spells. She also has substantial edema and complains of urine leakage if she coughs. She was very resistant to diuretics during her previous admission. She reports hair loss with the previous agent used, I think she is referring to metoprolol. Key examination changes: Bilateral wheezing, bilateral leg edema, acrocyanosis, irregular rhythm with widely split second heart sound, unable to hear any murmurs on exam Key new findings / data: ECG shows typical counterclockwise right atrial flutter with variable AV block, pre-existing right bundle branch block; previous echo shows enlarged right heart chambers consistent with chronic cor pulmonale; she had mild pulmonary hypertension by echo; BNP elevation is relatively mild. Creatinine is close to baseline at 1.95. Potassium is mildly decreased at 3.4. Magnesium has not been checked.  PLAN:  1. Aflutter: Discussed the stroke  implications. She agrees to take anticoagulants. She is worried about the cost of novel agents, she would not be able to afford a $45 a month co-pay. I encouraged her to start off of these medications, we will give her 30 day free Cardizem and then apply for patient assistance programs. She understands the increased bleeding risk and is very worried about a possible stroke so she agrees to take them. I think the best agent would be Eliquis. Borderline criteria to use reduced dose (she has abnormal renal function and is almost 76 years old). As far as rate control is concerned, will use a more selective beta blocker since carvedilol may be worsening her wheezing. Chose bisoprolol since metoprolol caused hair loss. 2. CHF: She has evidence of significant volume overload, but primarily signs of right heart failure. I think the dominant problem here is cor pulmonale due to chronic untreated obstructive sleep apnea and probably COPD with ongoing smoking. She doesn't appear to 120 knowledge the fact that she could have emphysema and refuses to treat the OSA.  3. CKD: Probably explains her resistance to diuretics. Recommend using a continuous infusion of furosemide. When we switch to by mouth diuretics, I think torsemide would be superior.  Thurmon Fair, MD, [facility] [phone] 11/04/2016, 6:37 PM

## 2016-11-04 NOTE — ED Notes (Signed)
Pt declined BiPap at this time. She wants to wait and see if lasix helps.  EDP stated that as long as her oxygen is in 80s then she can stay off bipap.  Patient is not in apparent distress at this time.

## 2016-11-04 NOTE — ED Notes (Signed)
On way to XR 

## 2016-11-04 NOTE — Progress Notes (Signed)
ANTICOAGULATION CONSULT NOTE - Initial Consult  Pharmacy Consult for Apixaban Indication: atrial fibrillation  Allergies  Allergen Reactions  . Sulfa Antibiotics Other (See Comments)    unknown  . Coreg [Carvedilol] Rash    Hair loss  . Zinc Rash    Patient Measurements:     Vital Signs: Temp: 98.3 F (36.8 C) (09/14 1312) Temp Source: Oral (09/14 1312) BP: 154/58 (09/14 1633) Pulse Rate: 62 (09/14 1630)  Labs:  Recent Labs  11/04/16 1315 11/04/16 1412  HGB 10.1*  --   HCT 32.8*  --   PLT 240  --   CREATININE 1.95*  --   TROPONINI  --  0.06*    CrCl cannot be calculated (Unknown ideal weight.).   Medical History: Past Medical History:  Diagnosis Date  . CHF (congestive heart failure) (HCC)   . Diabetes mellitus without complication (HCC)   . Hypertension   . Kidney disease   . Renal disorder   . Shingles     Medications:  Prescriptions Prior to Admission  Medication Sig Dispense Refill Last Dose  . acetaminophen (TYLENOL) 325 MG tablet Take 2 tablets (650 mg total) by mouth every 4 (four) hours as needed for mild pain, moderate pain, fever or headache.   unknown at PRN  . aspirin EC 81 MG tablet Take 81 mg by mouth daily.   11/04/2016 at Unknown time  . carvedilol (COREG) 3.125 MG tablet Take 3.125 mg by mouth 2 (two) times daily.  0 11/04/2016 at 0600  . docusate sodium (COLACE) 100 MG capsule Take 100 mg by mouth 2 (two) times daily as needed for constipation.   UNKNOWN at prn  . furosemide (LASIX) 80 MG tablet Take 1.5 tablets (120 mg total) by mouth 2 (two) times daily. (Patient taking differently: Take 160 mg by mouth 2 (two) times daily. )   11/04/2016 at Unknown time  . glimepiride (AMARYL) 4 MG tablet Take 4 mg by mouth daily.  1 11/04/2016 at Unknown time  . hydrALAZINE (APRESOLINE) 25 MG tablet Take 25 mg by mouth 3 (three) times daily.  0 11/04/2016 at Unknown time  . pioglitazone (ACTOS) 45 MG tablet Take 45 mg by mouth daily.  0 11/04/2016 at  Unknown time  . VENTOLIN HFA 108 (90 Base) MCG/ACT inhaler Inhale 2 puffs into the lungs daily.  1 11/04/2016 at Unknown time    Assessment: 76 yo F admitted with HF exacerbation and new aflutter.  To start anticoagulation.  Pt not on any anticoagulation PTA.  In terms of apixaban dosing criteria, age <80, weight > 60kg, and SCr > 1.5.   Goal of Therapy:  Therapeutic anticoagulation Monitor platelets by anticoagulation protocol: Yes   Plan:  Apixaban  PO BID Apixaban education Monitor for s/sx bleeding.  [employer], Pharm.D., BCPS Clinical Pharmacist 11/04/2016 7:01 PM

## 2016-11-04 NOTE — Progress Notes (Signed)
Patient refused BiPAP at this time. Stated she wanted to give lasix a try before going on the machine. Patient in no distress at this time. Sats 93% on 6 Lpm nasal cannula. MD made aware.

## 2016-11-04 NOTE — ED Notes (Signed)
Heart healthy/carb modified dinner tray ordered for patient.

## 2016-11-05 ENCOUNTER — Observation Stay (HOSPITAL_COMMUNITY): Payer: Medicare Other

## 2016-11-05 DIAGNOSIS — J189 Pneumonia, unspecified organism: Secondary | ICD-10-CM | POA: Diagnosis present

## 2016-11-05 DIAGNOSIS — J441 Chronic obstructive pulmonary disease with (acute) exacerbation: Secondary | ICD-10-CM | POA: Diagnosis present

## 2016-11-05 DIAGNOSIS — I4892 Unspecified atrial flutter: Secondary | ICD-10-CM | POA: Diagnosis present

## 2016-11-05 DIAGNOSIS — E1122 Type 2 diabetes mellitus with diabetic chronic kidney disease: Secondary | ICD-10-CM | POA: Diagnosis present

## 2016-11-05 DIAGNOSIS — I10 Essential (primary) hypertension: Secondary | ICD-10-CM | POA: Diagnosis not present

## 2016-11-05 DIAGNOSIS — Z79899 Other long term (current) drug therapy: Secondary | ICD-10-CM | POA: Diagnosis not present

## 2016-11-05 DIAGNOSIS — E039 Hypothyroidism, unspecified: Secondary | ICD-10-CM | POA: Diagnosis not present

## 2016-11-05 DIAGNOSIS — I361 Nonrheumatic tricuspid (valve) insufficiency: Secondary | ICD-10-CM | POA: Diagnosis not present

## 2016-11-05 DIAGNOSIS — Z8249 Family history of ischemic heart disease and other diseases of the circulatory system: Secondary | ICD-10-CM | POA: Diagnosis not present

## 2016-11-05 DIAGNOSIS — I483 Typical atrial flutter: Secondary | ICD-10-CM | POA: Diagnosis not present

## 2016-11-05 DIAGNOSIS — D649 Anemia, unspecified: Secondary | ICD-10-CM

## 2016-11-05 DIAGNOSIS — I5023 Acute on chronic systolic (congestive) heart failure: Secondary | ICD-10-CM

## 2016-11-05 DIAGNOSIS — Z888 Allergy status to other drugs, medicaments and biological substances status: Secondary | ICD-10-CM | POA: Diagnosis not present

## 2016-11-05 DIAGNOSIS — I130 Hypertensive heart and chronic kidney disease with heart failure and stage 1 through stage 4 chronic kidney disease, or unspecified chronic kidney disease: Secondary | ICD-10-CM | POA: Diagnosis present

## 2016-11-05 DIAGNOSIS — J440 Chronic obstructive pulmonary disease with acute lower respiratory infection: Secondary | ICD-10-CM | POA: Diagnosis present

## 2016-11-05 DIAGNOSIS — I2729 Other secondary pulmonary hypertension: Secondary | ICD-10-CM | POA: Diagnosis present

## 2016-11-05 DIAGNOSIS — N184 Chronic kidney disease, stage 4 (severe): Secondary | ICD-10-CM | POA: Diagnosis present

## 2016-11-05 DIAGNOSIS — F17210 Nicotine dependence, cigarettes, uncomplicated: Secondary | ICD-10-CM | POA: Diagnosis present

## 2016-11-05 DIAGNOSIS — R0602 Shortness of breath: Secondary | ICD-10-CM | POA: Diagnosis present

## 2016-11-05 DIAGNOSIS — E119 Type 2 diabetes mellitus without complications: Secondary | ICD-10-CM | POA: Diagnosis not present

## 2016-11-05 DIAGNOSIS — E872 Acidosis: Secondary | ICD-10-CM | POA: Diagnosis present

## 2016-11-05 DIAGNOSIS — J9601 Acute respiratory failure with hypoxia: Secondary | ICD-10-CM | POA: Diagnosis not present

## 2016-11-05 DIAGNOSIS — R140 Abdominal distension (gaseous): Secondary | ICD-10-CM | POA: Diagnosis not present

## 2016-11-05 DIAGNOSIS — I340 Nonrheumatic mitral (valve) insufficiency: Secondary | ICD-10-CM | POA: Diagnosis not present

## 2016-11-05 DIAGNOSIS — I50813 Acute on chronic right heart failure: Secondary | ICD-10-CM | POA: Diagnosis not present

## 2016-11-05 DIAGNOSIS — J9621 Acute and chronic respiratory failure with hypoxia: Secondary | ICD-10-CM | POA: Diagnosis present

## 2016-11-05 DIAGNOSIS — Z6841 Body Mass Index (BMI) 40.0 and over, adult: Secondary | ICD-10-CM | POA: Diagnosis not present

## 2016-11-05 DIAGNOSIS — I4510 Unspecified right bundle-branch block: Secondary | ICD-10-CM | POA: Diagnosis present

## 2016-11-05 DIAGNOSIS — J9622 Acute and chronic respiratory failure with hypercapnia: Secondary | ICD-10-CM | POA: Diagnosis present

## 2016-11-05 DIAGNOSIS — Z7982 Long term (current) use of aspirin: Secondary | ICD-10-CM | POA: Diagnosis not present

## 2016-11-05 DIAGNOSIS — I248 Other forms of acute ischemic heart disease: Secondary | ICD-10-CM | POA: Diagnosis present

## 2016-11-05 DIAGNOSIS — I5043 Acute on chronic combined systolic (congestive) and diastolic (congestive) heart failure: Secondary | ICD-10-CM | POA: Diagnosis present

## 2016-11-05 DIAGNOSIS — Z882 Allergy status to sulfonamides status: Secondary | ICD-10-CM | POA: Diagnosis not present

## 2016-11-05 DIAGNOSIS — G4733 Obstructive sleep apnea (adult) (pediatric): Secondary | ICD-10-CM | POA: Diagnosis present

## 2016-11-05 LAB — RESPIRATORY PANEL BY PCR
ADENOVIRUS-RVPPCR: NOT DETECTED
Bordetella pertussis: NOT DETECTED
CORONAVIRUS NL63-RVPPCR: NOT DETECTED
Chlamydophila pneumoniae: NOT DETECTED
Coronavirus 229E: NOT DETECTED
Coronavirus HKU1: NOT DETECTED
Coronavirus OC43: NOT DETECTED
INFLUENZA A H1-RVPPCR: NOT DETECTED
INFLUENZA A-RVPPCR: NOT DETECTED
INFLUENZA B-RVPPCR: NOT DETECTED
Influenza A H1 2009: NOT DETECTED
Influenza A H3: NOT DETECTED
METAPNEUMOVIRUS-RVPPCR: NOT DETECTED
Mycoplasma pneumoniae: NOT DETECTED
PARAINFLUENZA VIRUS 1-RVPPCR: NOT DETECTED
PARAINFLUENZA VIRUS 2-RVPPCR: NOT DETECTED
PARAINFLUENZA VIRUS 3-RVPPCR: NOT DETECTED
Parainfluenza Virus 4: NOT DETECTED
RESPIRATORY SYNCYTIAL VIRUS-RVPPCR: NOT DETECTED
RHINOVIRUS / ENTEROVIRUS - RVPPCR: DETECTED — AB

## 2016-11-05 LAB — BASIC METABOLIC PANEL
ANION GAP: 9 (ref 5–15)
BUN: 45 mg/dL — AB (ref 6–20)
CHLORIDE: 100 mmol/L — AB (ref 101–111)
CO2: 30 mmol/L (ref 22–32)
Calcium: 8.4 mg/dL — ABNORMAL LOW (ref 8.9–10.3)
Creatinine, Ser: 1.93 mg/dL — ABNORMAL HIGH (ref 0.44–1.00)
GFR calc Af Amer: 28 mL/min — ABNORMAL LOW (ref >60)
GFR, EST NON AFRICAN AMERICAN: 24 mL/min — AB (ref >60)
GLUCOSE: 108 mg/dL — AB (ref 65–99)
POTASSIUM: 3.6 mmol/L (ref 3.5–5.1)
Sodium: 139 mmol/L (ref 135–145)

## 2016-11-05 LAB — BLOOD GAS, ARTERIAL
Acid-Base Excess: 3.4 mmol/L — ABNORMAL HIGH (ref 0.0–2.0)
Bicarbonate: 29.4 mmol/L — ABNORMAL HIGH (ref 20.0–28.0)
DRAWN BY: 105521
O2 Content: 3 L/min
O2 SAT: 95.5 %
PCO2 ART: 62.6 mmHg — AB (ref 32.0–48.0)
Patient temperature: 98.6
pH, Arterial: 7.293 — ABNORMAL LOW (ref 7.350–7.450)
pO2, Arterial: 86.2 mmHg (ref 83.0–108.0)

## 2016-11-05 LAB — TROPONIN I
Troponin I: 0.06 ng/mL (ref <0.03)
Troponin I: 0.05 ng/mL (ref <0.03)

## 2016-11-05 LAB — GLUCOSE, CAPILLARY: Glucose-Capillary: 90 mg/dL (ref 65–99)

## 2016-11-05 LAB — MRSA PCR SCREENING: MRSA by PCR: NEGATIVE

## 2016-11-05 LAB — MAGNESIUM: MAGNESIUM: 2.5 mg/dL — AB (ref 1.7–2.4)

## 2016-11-05 MED ORDER — BENZONATATE 100 MG PO CAPS
100.0000 mg | ORAL_CAPSULE | Freq: Two times a day (BID) | ORAL | Status: DC
  Administered 2016-11-05 – 2016-11-16 (×21): 100 mg via ORAL
  Filled 2016-11-05 (×23): qty 1

## 2016-11-05 MED ORDER — IPRATROPIUM BROMIDE 0.02 % IN SOLN
0.5000 mg | Freq: Four times a day (QID) | RESPIRATORY_TRACT | Status: DC
  Administered 2016-11-05 – 2016-11-07 (×7): 0.5 mg via RESPIRATORY_TRACT
  Filled 2016-11-05 (×7): qty 2.5

## 2016-11-05 MED ORDER — DOXYCYCLINE HYCLATE 100 MG PO TABS
100.0000 mg | ORAL_TABLET | Freq: Two times a day (BID) | ORAL | Status: AC
  Administered 2016-11-05 – 2016-11-11 (×12): 100 mg via ORAL
  Filled 2016-11-05 (×12): qty 1

## 2016-11-05 MED ORDER — LEVALBUTEROL HCL 0.63 MG/3ML IN NEBU
0.6300 mg | INHALATION_SOLUTION | Freq: Four times a day (QID) | RESPIRATORY_TRACT | Status: DC
  Administered 2016-11-05 – 2016-11-07 (×7): 0.63 mg via RESPIRATORY_TRACT
  Filled 2016-11-05 (×8): qty 3

## 2016-11-05 MED ORDER — METHYLPREDNISOLONE SODIUM SUCC 125 MG IJ SOLR
60.0000 mg | Freq: Two times a day (BID) | INTRAMUSCULAR | Status: DC
  Administered 2016-11-05 – 2016-11-07 (×5): 60 mg via INTRAVENOUS
  Filled 2016-11-05 (×5): qty 2

## 2016-11-05 MED ORDER — GUAIFENESIN ER 600 MG PO TB12
1200.0000 mg | ORAL_TABLET | Freq: Two times a day (BID) | ORAL | Status: DC
  Administered 2016-11-05 – 2016-11-16 (×22): 1200 mg via ORAL
  Filled 2016-11-05 (×23): qty 2

## 2016-11-05 MED ORDER — HYDROCODONE-HOMATROPINE 5-1.5 MG/5ML PO SYRP
5.0000 mL | ORAL_SOLUTION | Freq: Four times a day (QID) | ORAL | Status: DC | PRN
  Administered 2016-11-05 – 2016-11-14 (×17): 5 mL via ORAL
  Filled 2016-11-05 (×18): qty 5

## 2016-11-05 MED ORDER — DEXTROMETHORPHAN POLISTIREX ER 30 MG/5ML PO SUER
15.0000 mg | Freq: Two times a day (BID) | ORAL | Status: DC
  Administered 2016-11-05 – 2016-11-16 (×22): 15 mg via ORAL
  Filled 2016-11-05 (×25): qty 5

## 2016-11-05 NOTE — Plan of Care (Signed)
Problem: Skin Integrity: Goal: Risk for impaired skin integrity will decrease Outcome: Progressing pure wick in place for masd, barrier cream applied

## 2016-11-05 NOTE — Significant Event (Addendum)
Rapid Response Event Note  Overview:  Called by staff that MD requesting assitance RRT team Time Called: 1243 Arrival Time: 1245 Event Type: Respiratory  Initial Focused Assessment:  On arrival patient supine in recliner chair  - pale -warm and dry - arouses to name - somewhat lethargic but answers questions - resps with exp wheezing noted - now on 2 liter nasal cannula - decreased from 4 liters per Dr. Croitoru [facility].  Bp 135/65 HR 78 Aflutter - RR 24 O2 sats 97% on 4 liters now 93 on 2 liters.     Interventions: Stat ABG per verbal order - breathing treatment prn given - ABG done - patient now arouses to name and stays awake - she complains only of labored breathing but states it is better - no pain - raspy voice - wet cough.  Lasix infusion noted.  Bil BS coarse with exp wheezing. Patient states she is not on home O2 nor CPAP.  Patient states she is tired because she does not sleep at night.  Sleeps in recliner a lot at home - sleeping in recliner here.  She has refused Bipap last 24 hours - she feels Lasix will do the job.    Plan of Care (if not transferred):  Handoff with Melissa RN - updated Dr. Royann Shivers with ABG results and mental status - requested parameters for O2 sats.  To call as needed.  Will follow.    Event Summary: Name of Physician Notified: Dr. Royann Shivers at  (on unit requesting RRT)    at          [place], [facility]

## 2016-11-05 NOTE — Progress Notes (Signed)
Assumed care from off going RN; patient alert but anxious due to having c/o's SOB; patient is currently on 5L of O2; patient is going off floor for xray

## 2016-11-05 NOTE — Progress Notes (Signed)
On arrival patient was sleeping, easy to arouse no distress noted, patient following commands SATs are stable. No respiratory compromise noted.  "Manadatory BiPAP at night" per CCM NP note. Pt REFUSE BiPAP tonight. Pt stated "that she feels fine and that she is not going to wear it". RN aware of patient refusal. Patient wants to rest in the recliner.

## 2016-11-05 NOTE — Consult Note (Signed)
Name: Sue Roach MRN: [id number] DOB: 1940/03/21    ADMISSION DATE:  11/04/2016 CONSULTATION DATE:  11/05/16  REFERRING MD :  Dr. Royann Shivers / Cardiology   CHIEF COMPLAINT:  Acute hypercarbic respiratory failure   HISTORY OF PRESENT ILLNESS:  76 y/o F, smoker, who presented to [facility] 9/14 after being evaluated at her PCP office and was found with saturations in the 60s.    Patient reports she sought medical evaluation on 9/14 due to increased cough and postnasal drip. She reported a 3 day history of scratchy, sore throat and headaches. She states that she has not slept for several days due to cough. The patient was admitted for acute hypoxic respiratory failure in the setting of right-sided heart failure/pulmonary hypertension, COPD with suspected exacerbation & new onset atrial fibrillation.  Work up found her to be positive for Rhinovirus on RVP.  She later developed wheezing during admission concerning for COPD exacerbation and was treated with IV steroids. She was seen by Cardiology and diuresed.    On 9/15 during cardiology rounding, the patient was found to be more somnolent. ABG was evaluated which showed a pH of 7.29, PCO2 62.6, PO2 86, bicarbonate 29.4.    PCCM consulted for evaluation of acute hypercarbic respiratory failure.  PAST MEDICAL HISTORY :   has a past medical history of CHF (congestive heart failure) (HCC); Diabetes mellitus without complication (HCC); Hypertension; Kidney disease; Renal disorder; and Shingles.   has no past surgical history on file.  Prior to Admission medications   Medication Sig Start Date End Date Taking? Authorizing Provider  acetaminophen (TYLENOL) 325 MG tablet Take 2 tablets (650 mg total) by mouth every 4 (four) hours as needed for mild pain, moderate pain, fever or headache. 02/05/16  Yes Hongalgi, Maximino Greenland, MD  aspirin EC 81 MG tablet Take 81 mg by mouth daily.   Yes [provider]  carvedilol (COREG) 3.125 MG tablet Take  3.125 mg by mouth 2 (two) times daily. 09/19/16  Yes [provider]  docusate sodium (COLACE) 100 MG capsule Take 100 mg by mouth 2 (two) times daily as needed for constipation.   Yes [provider]  furosemide (LASIX) 80 MG tablet Take 1.5 tablets (120 mg total) by mouth 2 (two) times daily. Patient taking differently: Take 160 mg by mouth 2 (two) times daily.  02/05/16  Yes Hongalgi, Maximino Greenland, MD  glimepiride (AMARYL) 4 MG tablet Take 4 mg by mouth daily. 11/02/16  Yes [provider]  hydrALAZINE (APRESOLINE) 25 MG tablet Take 25 mg by mouth 3 (three) times daily. 11/04/15  Yes [provider]  pioglitazone (ACTOS) 45 MG tablet Take 45 mg by mouth daily. 09/08/16  Yes [provider]  VENTOLIN HFA 108 (90 Base) MCG/ACT inhaler Inhale 2 puffs into the lungs daily. 11/03/16  Yes [provider]    Allergies  Allergen Reactions  . Sulfa Antibiotics Other (See Comments)    unknown  . Coreg [Carvedilol] Rash    Hair loss  . Zinc Rash    FAMILY HISTORY:  family history includes Hypertension in her maternal grandfather and mother.  SOCIAL HISTORY:  reports that she has been smoking Cigarettes.  She has been smoking about 1.00 pack per day. She has never used smokeless tobacco. She reports that she does not drink alcohol or use drugs.  REVIEW OF SYSTEMS:  POSITIVES IN BOLD Constitutional: Negative for fever, chills, weight loss, malaise/fatigue and diaphoresis.  HENT: Negative for hearing loss, ear pain,  nosebleeds, congestion, sore throat, neck pain, tinnitus and ear discharge.   Eyes: Negative for blurred vision, double vision, photophobia, pain, discharge and redness.  Respiratory: Negative for cough, hemoptysis, white sputum production, shortness of breath, wheezing and stridor.   Cardiovascular: Negative for chest pain, palpitations, orthopnea, claudication, leg swelling and PND.  Gastrointestinal: Negative for heartburn, nausea,  vomiting, abdominal pain, diarrhea, constipation, blood in stool and melena.  Genitourinary: Negative for dysuria, urgency, frequency, hematuria and flank pain.  Musculoskeletal: Negative for myalgias, back pain, joint pain and falls.  Skin: Negative for itching and rash.  Neurological: Negative for dizziness, tingling, tremors, sensory change, speech change, focal weakness, seizures, loss of consciousness, weakness and headaches.  Endo/Heme/Allergies: Negative for environmental allergies and polydipsia. Does not bruise/bleed easily.  SUBJECTIVE:   VITAL SIGNS: Temp:  [97.8 F (36.6 C)-98.2 F (36.8 C)] 98.2 F (36.8 C) (09/15 1300) Pulse Rate:  [59-83] 59 (09/15 1300) Resp:  [14-39] 21 (09/15 1300) BP: (121-179)/(50-97) 135/50 (09/15 1300) SpO2:  [90 %-98 %] 93 % (09/15 1300) Weight:  [254 lb 6.4 oz (115.4 kg)-257 lb 11.2 oz (116.9 kg)] 257 lb 11.2 oz (116.9 kg) (09/15 0439)  PHYSICAL EXAMINATION: General: Elderly female in no acute distress, sitting up in chair HEENT: MM pink/moist Neuro: AAO 4, speech clear, MAE CV: s1s2 rrr, no m/r/g PULM: even/non-labored, lungs bilaterally clear anterior, diminished bilateral lateral/posterior WU:JWJX, non-tender, bsx4 active  Extremities: warm/dry, bilateral lower extremity 1-2+edema  Skin: no rashes or lesions    Recent Labs Lab 11/04/16 1315 11/05/16 0216  NA 138 139  K 3.4* 3.6  CL 100* 100*  CO2 28 30  BUN 45* 45*  CREATININE 1.95* 1.93*  GLUCOSE 74 108*     Recent Labs Lab 11/04/16 1315  HGB 10.1*  HCT 32.8*  WBC 7.1  PLT 240    Dg Chest 2 View  Result Date: 11/05/2016 CLINICAL DATA:  CHF. EXAM: CHEST  2 VIEW COMPARISON:  Chest x-ray dated November 04, 2016. FINDINGS: Stable moderate cardiomegaly. Mild pulmonary vascular congestion. Mild basal predominant interstitial thickening, similar to prior study. Bibasilar atelectasis. No focal consolidation, pleural effusion, or pneumothorax. No acute osseous abnormality.  IMPRESSION: Stable cardiomegaly with mild interstitial edema. Electronically Signed   By: Obie Dredge M.D.   On: 11/05/2016 08:42   Dg Chest 2 View  Result Date: 11/04/2016 CLINICAL DATA:  76 year old female with a history of decreased oxygenation EXAM: CHEST  2 VIEW COMPARISON:  02/04/2016, 01/28/2016 FINDINGS: Cardiomediastinal silhouette again enlarged with cardiomegaly. Calcifications of the aortic arch. No pneumothorax. Interlobular septal thickening with coarsened interstitial markings. No large pleural effusion. No confluent airspace disease. Degenerative changes of the spine.  No displaced fracture IMPRESSION: Evidence of early edema with no large pleural effusion. Cardiomegaly. Electronically Signed   By: Gilmer Mor D.O.   On: 11/04/2016 13:50      SIGNIFICANT EVENTS  9/14  Admit  9/15  PCCM consulted for hypercarbic respiratory failure  STUDIES:  RVP 9/14 >> positive for rhinovirus   ASSESSMENT / PLAN:  Discussion:  76 year old female with multiple medical problems admitted 9/14 with reports of cough and found to be hypoxic at her PCP. Workup found her to be positive for rhinovirus and in decompensated heart failure. Later she developed wheezing with concerns for COPD exacerbation. On 9/15 she was found to be lethargic with an elevated PCO2.  Suspect this is secondary to viral infection superimposed on COPD, CHF.  Acute hypercarbic respiratory failure COPD Tobacco abuse Biventricular heart failure  Plan: Intermittent BiPAP and QHS No further ABGs Doxycycline BID x5 days  Intermittent chest x-ray Continue steroids,  Q12 > consider rapid reduction as able to avoid delirium  Xopenex + Atrovent Q6 Tessalon, delsym for cough Mucinex IS Q4 while awake Diuresis as renal function / BP permit  O2 for sats 88-94% with COPD hx    Canary Brim, NP-C [facility] Pgr: [phone] or if no answer [phone] 11/05/2016, 2:19 PM

## 2016-11-05 NOTE — Progress Notes (Signed)
PROGRESS NOTE    Sue Roach  ZOX:[id number] DOB: 1940-07-10 DOA: 11/04/2016 PCP: Kaleen Mask, MD   Brief Narrative:  Sue Roach is a 76 y.o. 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 Patient reports that 3 days ago she developed a scratchy, sore throat with a headache and persistent coughing of clear mucoid sputum. No fevers no chills. No significant change in chronic lower extremity edema. She did use a prescribed cream on her left lower extremity yesterday but her skin began to burn and she removed the cream and noticed a residual redness/erythema after removal of the cream. She went to her PCP office today for she was found to be hypoxemic. She does not wear chronic oxygen. Upon presentation to the ER on room air her saturations were 60%. Chest x-ray revealed early edema without effusion and cardiomegaly. She was also found to be in Atrial Flutter. Patient was admitted to SDU and Cardiology was consulted. Patient was placed on Anticoagulation and also placed on a Lasix gtt. This AM patient was severely wheezing but was refusing to go on BiPAP. She decompensated and was very Lethargic and found to be Hypercapnic with CO2 Narcosis so a Rapid was called but I was never notified by Nursing. Dr. Royann Shivers called me to tell me what happened as he was in the Room when events took place and he consulted Dr. Isaiah Serge of PCCM. PCCM evaluated and convinced patient to wear BiPAP and patient wore BiPAP intermittently until Saturations started dropping so then she agreed to go back on BiPAP.   Assessment & Plan:   Principal Problem:   Acute on chronic right-sided congestive heart failure (HCC) Active Problems:   Diabetes mellitus without complication (HCC)   Hypertension, uncontrolled   Hypothyroidism,  adult   Acute respiratory failure with hypoxemia (HCC)   CKD (chronic kidney disease) stage 4, GFR 15-29 ml/min (HCC)   Chronic anemia   Atrial flutter (HCC)  Acute respiratory failure with Hypercapnea and hypoxemia 2/2 Acute on chronic Biventricular congestive heart failure/Pulmonary HTN, OSA, and Rhinovirus -Presents with 3 days of cough headache and shortness of breath with x-ray findings of edema with mild elevation in BNP and troponin concerning acute heart failure exacerbation -On Lasix gtt -Cardiology Consulted and appreciated Eval -Continue carvedilol during acute exacerbation -No ACE I 2/2 CKD 4 -Continue supportive care with oxygen and now on BiPAP -Daily weights, strict I/O -Last echocardiogram completed December 2017: Preserved LV function, moderate LVH, moderate decreased right systolic function, mild pulmonary hypertension 36 mmHg, mild tricuspid regurgitation - -Repeat ECHO pending  -Suspect mild elevation in troponin secondary to acute heart failure exacerbation-cardiology consulted by EDP-continue to cycle troponin -Added IV Steroids, Xopenex/Atrovent, Flutter Valve, Incentive Spirometer, Mucinex, and Added Doxycycline -PCCM evaluated because patient was extremely lethargic this afternoon -Cardiology considering adding Metolazone -C/w BiPAP   Atrial flutter/RBBB -New onset and rate controlled -Likely related to underlying pulmonary issues/right atrial dilatation and acute hypoxia -Cardiology consultation appreciated -CHASDVASc=6 -Patient started on NOAC with Apixaban  Diabetes mellitus without complication  -discontinue Actos in setting of heart failure -hold Amaryl -SSI -HgbA1c  Hypertension, uncontrolled -Likely secondary to acute heart exacerbation -Continue hydralazine -Likely can resume carvedilol once proves hemodynamically stable with aggressive IV diuresis  CKD (chronic kidney disease) stage 4, GFR 15-29 ml/min -Renal function stable and at  baseline for patient  COPD -Has  wheezing and combination of CHF -Continue preadmission Ventolin inhaler -Given paroxysmal coughing may have viral syndrome so checked respiratory viral panel and was +for Rhinovirus -Patient reports has never had coughing episode like this before and has had several breathing treatments over the past 2 days which have not improved her symptoms -As Above, Added IV Steroids, Xopenex/Atrovent, Flutter Valve, Incentive Spirometer, Mucinex, and Added Doxycycline  Hypothyroidism, adult -Continue Synthroid  Chronic anemia -Hemoglobin stable and at baseline   L leg redness/erythema:  Sounds like reaction to cream she used from her PCP.  Would continue to monitor.   Prolonged QT:  Repeat EKG in AM  DVT prophylaxis: Anticoagulated with Apixaban Code Status: DO NOT RESUSCITATE Family Communication: No family present at bedside Disposition Plan: Remain in SDU  Consultants:   Cardiology  PCCM   Procedures:  ECHOCARDIOGRAM   Antimicrobials:  Anti-infectives    Start     Dose/Rate Route Frequency Ordered Stop   11/05/16 1700  doxycycline (VIBRA-TABS) tablet 100 mg     100 mg Oral Every 12 hours 11/05/16 1502       Subjective: Seen this AM and was having a difficulty breathing and stated she wasn't breathing properly because she was coughing. Had swelling and states she could not cough up mucous. No CP but was still SOB. No lightheadedness or dizziness.   Objective: Vitals:   11/05/16 0959 11/05/16 1138 11/05/16 1300 11/05/16 1502  BP:  137/60 (!) 135/50   Pulse:  (!) 59 (!) 59 (!) 59  Resp:  19 (!) 21 16  Temp:   98.2 F (36.8 C)   TempSrc:   Oral   SpO2: 92%  93%   Weight:      Height:        Intake/Output Summary (Last 24 hours) at 11/05/16 1937 Last data filed at 11/05/16 0800  Gross per 24 hour  Intake           109.67 ml  Output              800 ml  Net          -690.33 ml   Filed Weights   11/04/16 1915 11/05/16 0439    Weight: 115.4 kg (254 lb 6.4 oz) 116.9 kg (257 lb 11.2 oz)   Examination: Physical Exam:  Constitutional: WN/WD obese Caucasian female in Some Respiratory Distress Eyes: Lids and conjunctivae normal, sclerae anicteric  ENMT: External Ears, Nose appear normal. Grossly normal hearing. Mucous membranes are moist Neck: Appears normal, supple, no cervical masses, normal ROM, no appreciable thyromegaly,  Respiratory: Diminished to auscultation bilaterally with some wheezing,and crackles. Increased respiratory effort with mild accessory muscle use. Wearing Supplemental O2 Cardiovascular: Irregular Rhythm but normal rate, no murmurs / rubs / gallops. S1 and S2 auscultated.1-2+ Extremity edema. Abdomen: Soft, non-tender, non-distended. No masses palpated. No appreciable hepatosplenomegaly. Bowel sounds positive.  GU: Deferred. Musculoskeletal: No clubbing / cyanosis of digits/nails. No joint deformity upper and lower extremities Skin: No rashes, lesions, ulcers on a limited exam. No induration; Warm and dry.  Neurologic: CN 2-12 grossly intact with no focal deficits. Romberg sign cerebellar reflexes not assessed.  Psychiatric: Normal judgment and insight. Alert and oriented x 3. Normal mood and appropriate affect.   Data Reviewed: I have personally reviewed following labs and imaging studies  CBC:  Recent Labs Lab 11/04/16 1315  WBC 7.1  NEUTROABS 5.0  HGB 10.1*  HCT 32.8*  MCV 86.8  PLT 240   Basic Metabolic Panel:  Recent Labs Lab 11/04/16 1315 11/05/16 0216  NA 138 139  K 3.4* 3.6  CL 100* 100*  CO2 28 30  GLUCOSE 74 108*  BUN 45* 45*  CREATININE 1.95* 1.93*  CALCIUM 8.5* 8.4*  MG  --  2.5*   GFR: Estimated Creatinine Clearance: 28.6 mL/min (A) (by C-G formula based on SCr of 1.93 mg/dL (H)). Liver Function Tests: No results for input(s): AST, ALT, ALKPHOS, BILITOT, PROT, ALBUMIN in the last 168 hours. No results for input(s): LIPASE, AMYLASE in the last 168 hours. No  results for input(s): AMMONIA in the last 168 hours. Coagulation Profile: No results for input(s): INR, PROTIME in the last 168 hours. Cardiac Enzymes:  Recent Labs Lab 11/04/16 1412 11/04/16 2042 11/05/16 0216 11/05/16 1009  TROPONINI 0.06* 0.06* 0.06* 0.05*   BNP (last 3 results) No results for input(s): PROBNP in the last 8760 hours. HbA1C:  Recent Labs  11/04/16 2042  HGBA1C 6.7*   CBG:  Recent Labs Lab 11/04/16 1904 11/04/16 2043 11/05/16 0640  GLUCAP 178* 129* 90   Lipid Profile: No results for input(s): CHOL, HDL, LDLCALC, TRIG, CHOLHDL, LDLDIRECT in the last 72 hours. Thyroid Function Tests: No results for input(s): TSH, T4TOTAL, FREET4, T3FREE, THYROIDAB in the last 72 hours. Anemia Panel: No results for input(s): VITAMINB12, FOLATE, FERRITIN, TIBC, IRON, RETICCTPCT in the last 72 hours. Sepsis Labs: No results for input(s): PROCALCITON, LATICACIDVEN in the last 168 hours.  Recent Results (from the past 240 hour(s))  MRSA PCR Screening     Status: None   Collection Time: 11/04/16 10:09 PM  Result Value Ref Range Status   MRSA by PCR NEGATIVE NEGATIVE Final    Comment:        The GeneXpert MRSA Assay (FDA approved for NASAL specimens only), is one component of a comprehensive MRSA colonization surveillance program. It is not intended to diagnose MRSA infection nor to guide or monitor treatment for MRSA infections.   Respiratory Panel by PCR     Status: Abnormal   Collection Time: 11/05/16  3:50 AM  Result Value Ref Range Status   Adenovirus NOT DETECTED NOT DETECTED Final   Coronavirus 229E NOT DETECTED NOT DETECTED Final   Coronavirus HKU1 NOT DETECTED NOT DETECTED Final   Coronavirus NL63 NOT DETECTED NOT DETECTED Final   Coronavirus OC43 NOT DETECTED NOT DETECTED Final   Metapneumovirus NOT DETECTED NOT DETECTED Final   Rhinovirus / Enterovirus DETECTED (A) NOT DETECTED Final   Influenza A NOT DETECTED NOT DETECTED Final   Influenza A H1  NOT DETECTED NOT DETECTED Final   Influenza A H1 2009 NOT DETECTED NOT DETECTED Final   Influenza A H3 NOT DETECTED NOT DETECTED Final   Influenza B NOT DETECTED NOT DETECTED Final   Parainfluenza Virus 1 NOT DETECTED NOT DETECTED Final   Parainfluenza Virus 2 NOT DETECTED NOT DETECTED Final   Parainfluenza Virus 3 NOT DETECTED NOT DETECTED Final   Parainfluenza Virus 4 NOT DETECTED NOT DETECTED Final   Respiratory Syncytial Virus NOT DETECTED NOT DETECTED Final   Bordetella pertussis NOT DETECTED NOT DETECTED Final   Chlamydophila pneumoniae NOT DETECTED NOT DETECTED Final   Mycoplasma pneumoniae NOT DETECTED NOT DETECTED Final    Radiology Studies: Dg Chest 2 View  Result Date: 11/05/2016 CLINICAL DATA:  CHF. EXAM: CHEST  2 VIEW COMPARISON:  Chest x-ray dated November 04, 2016. FINDINGS: Stable moderate cardiomegaly. Mild pulmonary vascular congestion. Mild basal predominant interstitial thickening, similar to prior study. Bibasilar atelectasis. No focal  consolidation, pleural effusion, or pneumothorax. No acute osseous abnormality. IMPRESSION: Stable cardiomegaly with mild interstitial edema. Electronically Signed   By: Obie Dredge M.D.   On: 11/05/2016 08:42   Dg Chest 2 View  Result Date: 11/04/2016 CLINICAL DATA:  76 year old female with a history of decreased oxygenation EXAM: CHEST  2 VIEW COMPARISON:  02/04/2016, 01/28/2016 FINDINGS: Cardiomediastinal silhouette again enlarged with cardiomegaly. Calcifications of the aortic arch. No pneumothorax. Interlobular septal thickening with coarsened interstitial markings. No large pleural effusion. No confluent airspace disease. Degenerative changes of the spine.  No displaced fracture IMPRESSION: Evidence of early edema with no large pleural effusion. Cardiomegaly. Electronically Signed   By: Gilmer Mor D.O.   On: 11/04/2016 13:50   Scheduled Meds: . apixaban  5 mg Oral BID  . aspirin EC  81 mg Oral Daily  . benzonatate  100 mg  Oral BID  . bisoprolol  10 mg Oral Daily  . dextromethorphan  15 mg Oral BID  . doxycycline  100 mg Oral Q12H  . guaiFENesin  1,200 mg Oral BID  . hydrALAZINE  25 mg Oral TID  . Influenza vac split quadrivalent PF  0.5 mL Intramuscular Tomorrow-1000  . insulin aspart  0-5 Units Subcutaneous QHS  . insulin aspart  0-9 Units Subcutaneous TID WC  . ipratropium  0.5 mg Nebulization Q6H  . levalbuterol  0.63 mg Nebulization Q6H  . levothyroxine  25 mcg Oral QAC breakfast  . methylPREDNISolone (SOLU-MEDROL) injection  60 mg Intravenous Q12H  . potassium chloride SA  40 mEq Oral BID  . sodium chloride flush  3 mL Intravenous Q12H   Continuous Infusions: . sodium chloride    . furosemide (LASIX) infusion 10 mg/hr (11/04/16 2102)    LOS: 0 days   Merlene Laughter, DO [facility] Pager [phone]  If 7PM-7AM, please contact night-coverage [email or web address] Password [facility] 11/05/2016, 7:37 PM

## 2016-11-05 NOTE — Progress Notes (Addendum)
Progress Note  Patient Name: Sue Roach Date of Encounter: 11/05/2016  Primary Cardiologist: Herbie Baltimore  Subjective   Relatively mediocre UO, but about 700 mL net diuresis since starting lasix drip. Very groggy, had to repeatedly shake her to wake her up. Was on 4L O2 at the time, O2 sat 96%.  Inpatient Medications    Scheduled Meds: . apixaban  5 mg Oral BID  . aspirin EC  81 mg Oral Daily  . bisoprolol  10 mg Oral Daily  . guaiFENesin  1,200 mg Oral BID  . hydrALAZINE  25 mg Oral TID  . Influenza vac split quadrivalent PF  0.5 mL Intramuscular Tomorrow-1000  . insulin aspart  0-5 Units Subcutaneous QHS  . insulin aspart  0-9 Units Subcutaneous TID WC  . ipratropium  0.5 mg Nebulization Q6H  . levalbuterol  0.63 mg Nebulization Q6H  . levothyroxine  25 mcg Oral QAC breakfast  . methylPREDNISolone (SOLU-MEDROL) injection  60 mg Intravenous Q12H  . potassium chloride SA  40 mEq Oral BID  . sodium chloride flush  3 mL Intravenous Q12H   Continuous Infusions: . sodium chloride    . furosemide (LASIX) infusion 10 mg/hr (11/04/16 2102)   PRN Meds: sodium chloride, acetaminophen, guaiFENesin-dextromethorphan, HYDROcodone-homatropine, ondansetron (ZOFRAN) IV, sodium chloride flush   Vital Signs    Vitals:   11/05/16 0028 11/05/16 0430 11/05/16 0439 11/05/16 0959  BP: (!) 121/97  135/60   Pulse:  60    Resp:  14    Temp: 97.9 F (36.6 C)  98.1 F (36.7 C)   TempSrc: Oral  Oral   SpO2: 93%  90% 92%  Weight:   257 lb 11.2 oz (116.9 kg)   Height:        Intake/Output Summary (Last 24 hours) at 11/05/16 1231 Last data filed at 11/05/16 0800  Gross per 24 hour  Intake           171.67 ml  Output              800 ml  Net          -628.33 ml   Filed Weights   11/04/16 1915 11/05/16 0439  Weight: 254 lb 6.4 oz (115.4 kg) 257 lb 11.2 oz (116.9 kg)    Telemetry    Atrial flutter with mostly 4:1 AV block - Personally Reviewed  ECG    A flutter, RBBB -  Personally Reviewed  Physical Exam  Sleeping sitting up in recliner GEN: No acute distress.   Neck: No JVD Cardiac: RRR, no murmurs, rubs, or gallops.  Respiratory: Clear to auscultation bilaterally. GI: Soft, nontender, non-distended  MS: No edema; No deformity. Neuro:  Nonfocal  Psych: Normal affect   Labs    Chemistry Recent Labs Lab 11/04/16 1315 11/05/16 0216  NA 138 139  K 3.4* 3.6  CL 100* 100*  CO2 28 30  GLUCOSE 74 108*  BUN 45* 45*  CREATININE 1.95* 1.93*  CALCIUM 8.5* 8.4*  GFRNONAA 24* 24*  GFRAA 27* 28*  ANIONGAP 10 9     Hematology Recent Labs Lab 11/04/16 1315  WBC 7.1  RBC 3.78*  HGB 10.1*  HCT 32.8*  MCV 86.8  MCH 26.7  MCHC 30.8  RDW 19.4*  PLT 240    Cardiac Enzymes Recent Labs Lab 11/04/16 1412 11/04/16 2042 11/05/16 0216 11/05/16 1009  TROPONINI 0.06* 0.06* 0.06* 0.05*   No results for input(s): TROPIPOC in the last 168 hours.   BNP Recent Labs  Lab 11/04/16 1404  BNP 360.0*     DDimer No results for input(s): DDIMER in the last 168 hours.   Radiology    Dg Chest 2 View  Result Date: 11/05/2016 CLINICAL DATA:  CHF. EXAM: CHEST  2 VIEW COMPARISON:  Chest x-ray dated November 04, 2016. FINDINGS: Stable moderate cardiomegaly. Mild pulmonary vascular congestion. Mild basal predominant interstitial thickening, similar to prior study. Bibasilar atelectasis. No focal consolidation, pleural effusion, or pneumothorax. No acute osseous abnormality. IMPRESSION: Stable cardiomegaly with mild interstitial edema. Electronically Signed   By: Obie Dredge M.D.   On: 11/05/2016 08:42   Dg Chest 2 View  Result Date: 11/04/2016 CLINICAL DATA:  76 year old female with a history of decreased oxygenation EXAM: CHEST  2 VIEW COMPARISON:  02/04/2016, 01/28/2016 FINDINGS: Cardiomediastinal silhouette again enlarged with cardiomegaly. Calcifications of the aortic arch. No pneumothorax. Interlobular septal thickening with coarsened interstitial  markings. No large pleural effusion. No confluent airspace disease. Degenerative changes of the spine.  No displaced fracture IMPRESSION: Evidence of early edema with no large pleural effusion. Cardiomegaly. Electronically Signed   By: Gilmer Mor D.O.   On: 11/04/2016 13:50    Cardiac Studies   Echo 01/2016  - Left ventricle: Wall thickness was increased in a pattern ofmoderate LVH. Systolic function was normal. The estimatedejection fraction was in the range of 50% to 55%. - Aortic valve: Mildly calcified annulus. - Right ventricle: The cavity size was moderately dilated. Systolicfunction was moderately reduced. - Right atrium: The atrium was mildly to moderately dilated. - Pulmonary arteries: Systolic pressure was mildly increased. PApeak pressure: 36 mm Hg (S). Impressions: - Technically difficult study.  Patient Profile     76 y.o. female with a hx of chronic diastolic HF, DM, HTN and CKD presenting with acute HF exacerbation and new atrial flutter  Assessment & Plan    1. Aflutter: started apixaban. Rate is well controlled. 2. CHF: biventricular, R>L, preserved LVEF on echo in December. The dominant problem here is cor pulmonale due to chronic untreated obstructive sleep apnea and probably COPD with ongoing smoking. Again proving to be relatively diuretic resistant. Consider adding metolazone if diuresis does not pick up and switching to oral torsemide before DC. 3. CKD: Stable renal parameters so far.  When we switch to by mouth diuretics, I think torsemide would be superior. 4. OSA: encourage her to reconsider CPAP> 5. COPD:  Highly likely diagnosis, but she refuses to acknowledge it. Smoking cessation recommended. 6. Acute on chronic respiratory failure:  I think she has CO2 narcosis from excessive O2. I turned her O2 down to 2L/min and asked for a breathing treatment. With continued stimulation and deep breaths she seems to be recovering. May need an ABG. Avoid excessive  O2, sat of 92% should be sufficient. Asked rapid response to see her.  For questions or updates, please contact [facility] Please consult [email or web address] for contact info under Cardiology/STEMI.      Signed, Thurmon Fair, MD  11/05/2016, 12:31 PM

## 2016-11-05 NOTE — Plan of Care (Signed)
Problem: Food- and Nutrition-Related Knowledge Deficit (NB-1.1) Goal: Nutrition education Formal process to instruct or train a patient/client in a skill or to impart knowledge to help patients/clients voluntarily manage or modify food choices and eating behavior to maintain or improve health. Outcome: Progressing Nutrition Education Note  RD consulted for nutrition education regarding CHF.  Went through dietary recall: Breakfast: something small with meds-typically toast w/ Jelly Butter Lunch: Nothing in particular. Notes she will eat cold cut sandwiches often Dinner: Cheese/cracks, again something small Bevs: Water, soda, tea coffee. Says she has been instructed to drink 1 liter a day. Says she likely consumes more than this Other: Denies eating out often. Denies eating hotdogs, frozen meals, canned items with any consistency.Does not read nutritional labels. Stopped weighing self daily. She does report using the salt shaker occasionally for certain items, "it makes it taste better. One item she says she will add salt to is eggs.   A major aspect that affects her diet is the fact she lives alone. She has no one to help her prepare meals, nor does she want to prepare meals just for herself. She does "not care much for leftovers". As such, she relies on very easy to prepare foods. Naturally, convenience foods are high in sodium. She apparently had gone to rehab in the past with goal of increasing stamina and ability to perform ADLs, but she says "that was a joke".   While salt has the affect of making food taste better, RD explained that it can increase her SOB/Fluid retention. She says she hasnt even been retaining that much fluid. Obviously, she should eliminate salt shaker use  Cold cuts seem to be the highest sodium item in her diet. RD asked if she could prepare fresh meats and slice them. She does not feel she can do this, again, because she doesn't like cooking for herself. Urged her to  find lowest sodium ones she could fine. She could also rinse the meats off.   She eats a lot of cheese and crackers, again because of the convenience. RD listed some cheeses that are lower in sodium. Recommended mozzarella or swiss. Alternatively to cheese and crackers, she could choose PB and crackers, as a lower sodium option. She says she is already consuming a large amount of PB. Urged her to choose lower sodium brands.   Her breakfast of eggs and toast is fairly good if she would not add salt to it.   RD strongly encouraged her to read nutritional labels. She should look to purchase items that say "low sodium". Reduced sodium items may still be very high in salt.   Regarding her fluid intake, she does admit that it is likely she over consumes fluids. She says she does this though "when my urine smells like ammonia I know I need to drink more fluid. She does not weigh herself daily anymore.   Much of conversation was spent brainstorming lower salt, convenience items she could eat. Asked about grits/oatmeal, but she rarely consumes these. Recommended granola bars to which she says "I cant stand those". RD asked about yogurt. She does say she will occasionally eat this. Unsalted nuts are another good option. She says she does like these.   Summary of Reccomendations 1. Avoid lunch meats, these are very high in sodium-> she is not to willing to cook her own fresh meats 2. Avoid salt shaker-> states she only uses it a little 3. Adhere to her fluid prescription, weigh self daily 4. Read  labels, choose items with "low sodium" or "heart healthy" labels. Choose foods w/ <300 mg sodium per serving 5. Choose lower sodium convenience options (see above)  Expect Poor compliance.Patient has a lack of support and sounds to have a low performance status. As such, she is mitigated to convenience foods. Furthermore, she subjectively seems depressed and makes many hopeless statements regarding how she perceives  supportive diet/meds/habits to be futile. For example, stated she stopped weighing herself daily because if she call the doctor "what the hell is he supposed to do about it". She has a lack of motivation to make better diet habits, for example, she was thirsty, but refused to drink diet soda, has to be regular coke.   Body mass index is 50.33 kg/m. Pt meets criteria for Morbidly obese based on current BMI.  Current diet order is heart healthy/carb mod. There is no documented intake of meals at this time. Labs and medications reviewed. No further nutrition interventions warranted at this time.  If additional nutrition issues arise, please re-consult RD.   Christophe Louis RD, LDN, CNSC Clinical Nutrition Pager: [phone] 11/05/2016 11:38 AM

## 2016-11-05 NOTE — Procedures (Signed)
Came to room twice today for echo, but patient unable to lie in bed due to respiratory status.  Unable to perform echo with patient sitting upright in chair.

## 2016-11-05 NOTE — Progress Notes (Signed)
Late entry: assumed care early morning shift  Patient down for chest xray via bed; patient returned back to room and back to chair;   Morning shift patient wheezing and congestion; respiratory gave treatment; patient up in chair with O2 @ 4L; cardio MD rounded and requested ABG due to patient lethargy; Pulomary NP rounded and new orders for patient to be placed on bipap; patient agreed during this time;    patient took bipap off and refused to put bipap back on; patient was asked several times to have bipap back on due to dropping in O2 sats; patient is noncompliant and needs teaching re-enforced;  patient agree to put bipap back on due to 61% O2 sats;  (MD notifed)

## 2016-11-05 NOTE — Progress Notes (Signed)
Pt refused to wear Bipap, I explained to pt need for Bipap and that pulmonary docs said it was mandatory to wear pt still refused 94 on 4 liters will continue to monitor

## 2016-11-05 NOTE — Discharge Instructions (Addendum)
Heart Failure Heart failure is a condition in which the heart has trouble pumping blood because it has become weak or stiff. This means that the heart does not pump blood efficiently for the body to work well. For some people with heart failure, fluid may back up into the lungs and there may be swelling (edema) in the lower legs. Heart failure is usually a long-term (chronic) condition. It is important for you to take good care of yourself and follow the treatment plan from your health care provider. What are the causes? This condition is caused by some health problems, including:  High blood pressure (hypertension). Hypertension causes the heart muscle to work harder than normal. High blood pressure eventually causes the heart to become stiff and weak.  Coronary artery disease (CAD). CAD is the buildup of cholesterol and fat (plaques) in the arteries of the heart.  Heart attack (myocardial infarction). Injured tissue, which is caused by the heart attack, does not contract as well and the heart's ability to pump blood is weakened.  Abnormal heart valves. When the heart valves do not open and close properly, the heart muscle must pump harder to keep the blood flowing.  Heart muscle disease (cardiomyopathy or myocarditis). Heart muscle disease is damage to the heart muscle from a variety of causes, such as drug or alcohol abuse, infections, or unknown causes. These can increase the risk of heart failure.  Lung disease. When the lungs do not work properly, the heart must work harder.  What increases the risk? Risk of heart failure increases as a person ages. This condition is also more likely to develop in people who:  Are overweight.  Are female.  Smoke or chew tobacco.  Abuse alcohol or illegal drugs.  Have taken medicines that can damage the heart, such as chemotherapy drugs.  Have diabetes. ? High blood sugar (glucose) is associated with high fat (lipid) levels in the  blood. ? Diabetes can also damage tiny blood vessels that carry nutrients to the heart muscle.  Have abnormal heart rhythms.  Have thyroid problems.  Have low blood counts (anemia).  What are the signs or symptoms? Symptoms of this condition include:  Shortness of breath with activity, such as when climbing stairs.  Persistent cough.  Swelling of the feet, ankles, legs, or abdomen.  Unexplained weight gain.  Difficulty breathing when lying flat (orthopnea).  Waking from sleep because of the need to sit up and get more air.  Rapid heartbeat.  Fatigue and loss of energy.  Feeling light-headed, dizzy, or close to fainting.  Loss of appetite.  Nausea.  Increased urination during the night (nocturia).  Confusion.  How is this diagnosed? This condition is diagnosed based on:  Medical history, symptoms, and a physical exam.  Diagnostic tests, which may include: ? Echocardiogram. ? Electrocardiogram (ECG). ? Chest X-ray. ? Blood tests. ? Exercise stress test. ? Radionuclide scans. ? Cardiac catheterization and angiogram.  How is this treated? Treatment for this condition is aimed at managing the symptoms of heart failure. Medicines, behavioral changes, or other treatments may be necessary to treat heart failure. Medicines These may include:  Angiotensin-converting enzyme (ACE) inhibitors. This type of medicine blocks the effects of a blood protein called angiotensin-converting enzyme. ACE inhibitors relax (dilate) the blood vessels and help to lower blood pressure.  Angiotensin receptor blockers (ARBs). This type of medicine blocks the actions of a blood protein called angiotensin. ARBs dilate the blood vessels and help to lower blood pressure.  Water pills (diuretics). Diuretics cause the kidneys to remove salt and water from the blood. The extra fluid is removed through urination, leaving a lower volume of blood that the heart has to pump.  Beta blockers.  These improve heart muscle strength and they prevent the heart from beating too quickly.  Digoxin. This increases the force of the heartbeat.  Healthy behavior changes These may include:  Reaching and maintaining a healthy weight.  Stopping smoking or chewing tobacco.  Eating heart-healthy foods.  Limiting or avoiding alcohol.  Stopping use of street drugs (illegal drugs).  Physical activity.  Other treatments These may include:  Surgery to open blocked coronary arteries or repair damaged heart valves.  Placement of a biventricular pacemaker to improve heart muscle function (cardiac resynchronization therapy). This device paces both the right ventricle and left ventricle.  Placement of a device to treat serious abnormal heart rhythms (implantable cardioverter defibrillator, or ICD).  Placement of a device to improve the pumping ability of the heart (left ventricular assist device, or LVAD).  Heart transplant. This can cure heart failure, and it is considered for certain patients who do not improve with other therapies.  Follow these instructions at home: Medicines  Take over-the-counter and prescription medicines only as told by your health care provider. Medicines are important in reducing the workload of your heart, slowing the progression of heart failure, and improving your symptoms. ? Do not stop taking your medicine unless your health care provider told you to do that. ? Do not skip any dose of medicine. ? Refill your prescriptions before you run out of medicine. You need your medicines every day. Eating and drinking   Eat heart-healthy foods. Talk with a dietitian to make an eating plan that is right for you. ? Choose foods that contain no trans fat and are low in saturated fat and cholesterol. Healthy choices include fresh or frozen fruits and vegetables, fish, lean meats, legumes, fat-free or low-fat dairy products, and whole-grain or high-fiber foods. ? Limit  salt (sodium) if directed by your health care provider. Sodium restriction may reduce symptoms of heart failure. Ask a dietitian to recommend heart-healthy seasonings. ? Use healthy cooking methods instead of frying. Healthy methods include roasting, grilling, broiling, baking, poaching, steaming, and stir-frying.  Limit your fluid intake if directed by your health care provider. Fluid restriction may reduce symptoms of heart failure. Lifestyle  Stop smoking or using chewing tobacco. Nicotine and tobacco can damage your heart and your blood vessels. Do not use nicotine gum or patches before talking to your health care provider.  Limit alcohol intake to no more than 1 drink per day for non-pregnant women and 2 drinks per day for men. One drink equals 12 oz of beer, 5 oz of wine, or 1 oz of hard liquor. ? Drinking more than that is harmful to your heart. Tell your health care provider if you drink alcohol several times a week. ? Talk with your health care provider about whether any level of alcohol use is safe for you. ? If your heart has already been damaged by alcohol or you have severe heart failure, drinking alcohol should be stopped completely.  Stop use of illegal drugs.  Lose weight if directed by your health care provider. Weight loss may reduce symptoms of heart failure.  Do moderate physical activity if directed by your health care provider. People who are elderly and people with severe heart failure should consult with a health care provider for physical activity  recommendations. Monitor important information  Weigh yourself every day. Keeping track of your weight daily helps you to notice excess fluid sooner. ? Weigh yourself every morning after you urinate and before you eat breakfast. ? Wear the same amount of clothing each time you weigh yourself. ? Record your daily weight. Provide your health care provider with your weight record.  Monitor and record your blood pressure as  told by your health care provider.  Check your pulse as told by your health care provider. Dealing with extreme temperatures  If the weather is extremely hot: ? Avoid vigorous physical activity. ? Use air conditioning or fans or seek a cooler location. ? Avoid caffeine and alcohol. ? Wear loose-fitting, lightweight, and light-colored clothing.  If the weather is extremely cold: ? Avoid vigorous physical activity. ? Layer your clothes. ? Wear mittens or gloves, a hat, and a scarf when you go outside. ? Avoid alcohol. General instructions  Manage other health conditions such as hypertension, diabetes, thyroid disease, or abnormal heart rhythms as told by your health care provider.  Learn to manage stress. If you need help to do this, ask your health care provider.  Plan rest periods when fatigued.  Get ongoing education and support as needed.  Participate in or seek rehabilitation as needed to maintain or improve independence and quality of life.  Stay up to date with immunizations. Keeping current on pneumococcal and influenza immunizations is especially important to prevent respiratory infections.  Keep all follow-up visits as told by your health care provider. This is important. Contact a health care provider if:  You have a rapid weight gain.  You have increasing shortness of breath that is unusual for you.  You are unable to participate in your usual physical activities.  You tire easily.  You cough more than normal, especially with physical activity.  You have any swelling or more swelling in areas such as your hands, feet, ankles, or abdomen.  You are unable to sleep because it is hard to breathe.  You feel like your heart is beating quickly (palpitations).  You become dizzy or light-headed when you stand up. Get help right away if:  You have difficulty breathing.  You notice or your family notices a change in your awareness, such as having trouble staying  awake or having difficulty with concentration.  You have pain or discomfort in your chest.  You have an episode of fainting (syncope). This information is not intended to replace advice given to you by your health care provider. Make sure you discuss any questions you have with your health care provider. Document Released: 02/07/2005 Document Revised: 10/13/2015 Document Reviewed: 09/02/2015 Elsevier Interactive Patient Education  2017 [employer]. Information on my medicine - Coumadin   (Warfarin)  This medication education was reviewed with me or my healthcare representative as part of my discharge preparation.    Why was Coumadin prescribed for you? Coumadin was prescribed for you because you have a blood clot or a medical condition that can cause an increased risk of forming blood clots. Blood clots can cause serious health problems by blocking the flow of blood to the heart, lung, or brain. Coumadin can prevent harmful blood clots from forming. As a reminder your indication for Coumadin is:   Stroke Prevention Because Of Atrial Fibrillation  What test will check on my response to Coumadin? While on Coumadin (warfarin) you will need to have an INR test regularly to ensure that your dose is keeping you in  the desired range. The INR (international normalized ratio) number is calculated from the result of the laboratory test called prothrombin time (PT).  If an INR APPOINTMENT HAS NOT ALREADY BEEN MADE FOR YOU please schedule an appointment to have this lab work done by your health care provider within 7 days. Your INR goal is usually a number between:  2 to 3 or your provider may give you a more narrow range like 2-2.5.  Ask your health care provider during an office visit what your goal INR is.  What  do you need to  know  About  COUMADIN? Take Coumadin (warfarin) exactly as prescribed by your healthcare provider about the same time each day.  DO NOT stop taking without talking to the  doctor who prescribed the medication.  Stopping without other blood clot prevention medication to take the place of Coumadin may increase your risk of developing a new clot or stroke.  Get refills before you run out.  What do you do if you miss a dose? If you miss a dose, take it as soon as you remember on the same day then continue your regularly scheduled regimen the next day.  Do not take two doses of Coumadin at the same time.  Important Safety Information A possible side effect of Coumadin (Warfarin) is an increased risk of bleeding. You should call your healthcare provider right away if you experience any of the following: ? Bleeding from an injury or your nose that does not stop. ? Unusual colored urine (red or dark brown) or unusual colored stools (red or black). ? Unusual bruising for unknown reasons. ? A serious fall or if you hit your head (even if there is no bleeding).  Some foods or medicines interact with Coumadin (warfarin) and might alter your response to warfarin. To help avoid this: ? Eat a balanced diet, maintaining a consistent amount of Vitamin K. ? Notify your provider about major diet changes you plan to make. ? Avoid alcohol or limit your intake to 1 drink for women and 2 drinks for men per day. (1 drink is 5 oz. wine, 12 oz. beer, or 1.5 oz. liquor.)  Make sure that ANY health care provider who prescribes medication for you knows that you are taking Coumadin (warfarin).  Also make sure the healthcare provider who is monitoring your Coumadin knows when you have started a new medication including herbals and non-prescription products.  Coumadin (Warfarin)  Major Drug Interactions  Increased Warfarin Effect Decreased Warfarin Effect  Alcohol (large quantities) Antibiotics (esp. Septra/Bactrim, Flagyl, Cipro) Amiodarone (Cordarone) Aspirin (ASA) Cimetidine (Tagamet) Megestrol (Megace) NSAIDs (ibuprofen, naproxen, etc.) Piroxicam (Feldene) Propafenone (Rythmol  SR) Propranolol (Inderal) Isoniazid (INH) Posaconazole (Noxafil) Barbiturates (Phenobarbital) Carbamazepine (Tegretol) Chlordiazepoxide (Librium) Cholestyramine (Questran) Griseofulvin Oral Contraceptives Rifampin Sucralfate (Carafate) Vitamin K   Coumadin (Warfarin) Major Herbal Interactions  Increased Warfarin Effect Decreased Warfarin Effect  Garlic Ginseng Ginkgo biloba Coenzyme Q10 Green tea St. Johns wort    Coumadin (Warfarin) FOOD Interactions  Eat a consistent number of servings per week of foods HIGH in Vitamin K (1 serving =  cup)  Collards (cooked, or boiled & drained) Kale (cooked, or boiled & drained) Mustard greens (cooked, or boiled & drained) Parsley *serving size only =  cup Spinach (cooked, or boiled & drained) Swiss chard (cooked, or boiled & drained) Turnip greens (cooked, or boiled & drained)  Eat a consistent number of servings per week of foods MEDIUM-HIGH in Vitamin K (1 serving = 1 cup)  Asparagus (cooked, or boiled & drained) Broccoli (cooked, boiled & drained, or raw & chopped) Brussel sprouts (cooked, or boiled & drained) *serving size only =  cup Lettuce, raw (green leaf, endive, romaine) Spinach, raw Turnip greens, raw & chopped   These websites have more information on Coumadin (warfarin):  FailFactory.se; VeganReport.com.au;

## 2016-11-06 ENCOUNTER — Other Ambulatory Visit (HOSPITAL_COMMUNITY): Payer: Self-pay

## 2016-11-06 ENCOUNTER — Inpatient Hospital Stay (HOSPITAL_COMMUNITY): Payer: Medicare Other

## 2016-11-06 LAB — CBC WITH DIFFERENTIAL/PLATELET
BASOS ABS: 0 10*3/uL (ref 0.0–0.1)
Basophils Relative: 0 %
EOS ABS: 0 10*3/uL (ref 0.0–0.7)
EOS PCT: 0 %
HCT: 36.6 % (ref 36.0–46.0)
HEMOGLOBIN: 10.7 g/dL — AB (ref 12.0–15.0)
LYMPHS PCT: 7 %
Lymphs Abs: 0.4 10*3/uL — ABNORMAL LOW (ref 0.7–4.0)
MCH: 26.1 pg (ref 26.0–34.0)
MCHC: 29.2 g/dL — ABNORMAL LOW (ref 30.0–36.0)
MCV: 89.3 fL (ref 78.0–100.0)
Monocytes Absolute: 0.2 10*3/uL (ref 0.1–1.0)
Monocytes Relative: 3 %
NEUTROS ABS: 5.6 10*3/uL (ref 1.7–7.7)
NEUTROS PCT: 90 %
PLATELETS: 281 10*3/uL (ref 150–400)
RBC: 4.1 MIL/uL (ref 3.87–5.11)
RDW: 19.1 % — ABNORMAL HIGH (ref 11.5–15.5)
WBC: 6.3 10*3/uL (ref 4.0–10.5)

## 2016-11-06 LAB — GLUCOSE, CAPILLARY
GLUCOSE-CAPILLARY: 154 mg/dL — AB (ref 65–99)
GLUCOSE-CAPILLARY: 262 mg/dL — AB (ref 65–99)
Glucose-Capillary: 282 mg/dL — ABNORMAL HIGH (ref 65–99)
Glucose-Capillary: 248 mg/dL — ABNORMAL HIGH (ref 65–99)
Glucose-Capillary: 268 mg/dL — ABNORMAL HIGH (ref 65–99)

## 2016-11-06 LAB — COMPREHENSIVE METABOLIC PANEL
ALBUMIN: 3.2 g/dL — AB (ref 3.5–5.0)
ALK PHOS: 96 U/L (ref 38–126)
ALT: 21 U/L (ref 14–54)
AST: 26 U/L (ref 15–41)
Anion gap: 11 (ref 5–15)
BUN: 56 mg/dL — AB (ref 6–20)
CHLORIDE: 100 mmol/L — AB (ref 101–111)
CO2: 25 mmol/L (ref 22–32)
CREATININE: 2.11 mg/dL — AB (ref 0.44–1.00)
Calcium: 8.5 mg/dL — ABNORMAL LOW (ref 8.9–10.3)
GFR calc non Af Amer: 21 mL/min — ABNORMAL LOW (ref >60)
GFR, EST AFRICAN AMERICAN: 25 mL/min — AB (ref >60)
GLUCOSE: 121 mg/dL — AB (ref 65–99)
Potassium: 4.8 mmol/L (ref 3.5–5.1)
SODIUM: 136 mmol/L (ref 135–145)
Total Bilirubin: 1 mg/dL (ref 0.3–1.2)
Total Protein: 7.6 g/dL (ref 6.5–8.1)

## 2016-11-06 LAB — PHOSPHORUS: PHOSPHORUS: 4.9 mg/dL — AB (ref 2.5–4.6)

## 2016-11-06 LAB — MAGNESIUM: Magnesium: 2.6 mg/dL — ABNORMAL HIGH (ref 1.7–2.4)

## 2016-11-06 NOTE — Progress Notes (Signed)
Progress Note  Patient Name: Sue Roach Date of Encounter: 11/06/2016  Primary Cardiologist: Herbie Baltimore  Subjective   Looks better today. No new episodes of acute respiratory decompensation. Refusing to wear BiPAP. Diuresis continues at a relatively slow pace, 1.3 L net negative for last 48 hours. Paradoxically, weight has increased by 3 pounds. Slight worsening of creatinine at 2.11, BUN 56 (yesterday 1.93/45). Minimal abnormalities in cardiac troponin. Normal potassium. Echocardiogram pending.  Tried to discuss her respiratory problems and educate her about the risks of excessive oxygen when she has CO2 retention chronically. I had a very hard time trying to get my message across. Even asking the simple question of whether or not she feels better, cannot get a straight answer. She interrupts repeatedly. Voices opinions on all types of matter and generally states that doctors don't know what they're doing anyway. Unhappy with her current dose of Lasix, saying that she needs more.  Inpatient Medications    Scheduled Meds: . apixaban  5 mg Oral BID  . aspirin EC  81 mg Oral Daily  . benzonatate  100 mg Oral BID  . bisoprolol  10 mg Oral Daily  . dextromethorphan  15 mg Oral BID  . doxycycline  100 mg Oral Q12H  . guaiFENesin  1,200 mg Oral BID  . hydrALAZINE  25 mg Oral TID  . Influenza vac split quadrivalent PF  0.5 mL Intramuscular Tomorrow-1000  . insulin aspart  0-5 Units Subcutaneous QHS  . insulin aspart  0-9 Units Subcutaneous TID WC  . ipratropium  0.5 mg Nebulization Q6H  . levalbuterol  0.63 mg Nebulization Q6H  . levothyroxine  25 mcg Oral QAC breakfast  . methylPREDNISolone (SOLU-MEDROL) injection  60 mg Intravenous Q12H  . potassium chloride SA  40 mEq Oral BID  . sodium chloride flush  3 mL Intravenous Q12H   Continuous Infusions: . sodium chloride    . furosemide (LASIX) infusion 10 mg/hr (11/06/16 0500)   PRN Meds: sodium chloride, acetaminophen,  HYDROcodone-homatropine, ondansetron (ZOFRAN) IV, sodium chloride flush   Vital Signs    Vitals:   11/06/16 0320 11/06/16 0826 11/06/16 0927 11/06/16 1143  BP: (!) 122/55 116/64    Pulse: (!) 58 (!) 57    Resp: 14 15    Temp: (!) 97.5 F (36.4 C) 98 F (36.7 C)  (!) 97.4 F (36.3 C)  TempSrc: Oral Oral  Oral  SpO2: 94% 90% 90%   Weight: 260 lb 3.2 oz (118 kg)     Height:        Intake/Output Summary (Last 24 hours) at 11/06/16 1406 Last data filed at 11/06/16 0800  Gross per 24 hour  Intake              450 ml  Output             1100 ml  Net             -650 ml   Filed Weights   11/04/16 1915 11/05/16 0439 11/06/16 0320  Weight: 254 lb 6.4 oz (115.4 kg) 257 lb 11.2 oz (116.9 kg) 260 lb 3.2 oz (118 kg)    Telemetry    Atrial flutter with predominantly 4:1 AV block - Personally Reviewed  ECG    No new tracing - Personally Reviewed  Physical Exam  Alert and oriented, oxygen saturation 100% on 3 L oxygen by nasal cannula. Morbidly obese GEN: No acute distress.   Neck:  difficult to see her jugular veins, but  seems to have at least moderate JVD Cardiac:  irregular, widely split S2, no murmurs, rubs, or gallops.  Respiratory: Clear to auscultation bilaterally. GI: Soft, nontender, non-distended  MS:  3+ bilateral pedal and ankle edema; No deformity. Neuro:  Nonfocal  Psych: Normal affect   Labs    Chemistry Recent Labs Lab 11/04/16 1315 11/05/16 0216 11/06/16 0340  NA 138 139 136  K 3.4* 3.6 4.8  CL 100* 100* 100*  CO2 GLUCOSE 74 108* 121*  BUN 45* 45* 56*  CREATININE 1.95* 1.93* 2.11*  CALCIUM 8.5* 8.4* 8.5*  PROT  --   --  7.6  ALBUMIN  --   --  3.2*  AST  --   --  26  ALT  --   --  21  ALKPHOS  --   --  96  BILITOT  --   --  1.0  GFRNONAA 24* 24* 21*  GFRAA 27* 28* 25*  ANIONGAP Hematology Recent Labs Lab 11/04/16 1315 11/06/16 0340  WBC 7.1 6.3  RBC 3.78* 4.10  HGB 10.1* 10.7*  HCT 32.8* 36.6  MCV 86.8 89.3    MCH 26.7 26.1  MCHC 30.8 29.2*  RDW 19.4* 19.1*  PLT 240 281    Cardiac Enzymes Recent Labs Lab 11/04/16 1412 11/04/16 2042 11/05/16 0216 11/05/16 1009  TROPONINI 0.06* 0.06* 0.06* 0.05*   No results for input(s): TROPIPOC in the last 168 hours.   BNP Recent Labs Lab 11/04/16 1404  BNP 360.0*     DDimer No results for input(s): DDIMER in the last 168 hours.   Radiology    Dg Chest 2 View  Result Date: 11/05/2016 CLINICAL DATA:  CHF. EXAM: CHEST  2 VIEW COMPARISON:  Chest x-ray dated November 04, 2016. FINDINGS: Stable moderate cardiomegaly. Mild pulmonary vascular congestion. Mild basal predominant interstitial thickening, similar to prior study. Bibasilar atelectasis. No focal consolidation, pleural effusion, or pneumothorax. No acute osseous abnormality. IMPRESSION: Stable cardiomegaly with mild interstitial edema. Electronically Signed   By: Obie Dredge M.D.   On: 11/05/2016 08:42    Cardiac Studies   Echo pending  Patient Profile     76 y.o. 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 Yesterday had an episode of hypercapnic respiratory failure with near comatose state.  Assessment & Plan    1. Aflutter: Started on anticoagulation: apixaban. Rate is well controlled. 2. CHF: biventricular, R>L, preserved LVEF on echo in December. Echo pending on this admission. The dominant problem here is cor pulmonale due to chronic untreated obstructive sleep apnea and probably COPD with ongoing smoking. Again proving to be relatively diuretic resistant. Consider adding metolazone if diuresis does not pick up and switching to oral torsemide before DC. 3. CKD: Stable renal parameters so far.  When we switch to by mouth diuretics, Ithink torsemide would be superior. 4. OSA: encourage her to  reconsider CPAP and to use BiPAP while in the hospital. 5. COPD:  Highly likely diagnosis, but she refuses to acknowledge it. Smoking cessation recommended. 6. Acute on chronic respiratory failure:   she has clear evidence of CO2 retention. I tried to discuss the dangers of oxygen and excess with this condition but she simply would not listen. Any attempts on my part to redirect the discussion to what I  thought was medically important for her to know were promptly interrupted. Hopefully, she will be more open to discussion and education and to follow medical advice when a new cardiologist sees her tomorrow.  For questions or updates, please contact [facility] Please consult [email or web address] for contact info under Cardiology/STEMI.      Signed, Thurmon Fair, MD  11/06/2016, 2:06 PM

## 2016-11-06 NOTE — Progress Notes (Signed)
Pt refuse BiPAP for the night. Pt is stable at this time resting in the recliner.

## 2016-11-06 NOTE — Progress Notes (Signed)
Late entry: assumed care from off going RN; patient alert & oriented; patient in chair , alert & oriented times 3; on 2L O2 via ; SB on tele; patient assisted to [facility] and bathroom with denial of SOB throughout shift; patient requested shower; will ask MD in AM; patient requested not to be on bipap; cough medication given ; report given to on coming RN @ [address]

## 2016-11-06 NOTE — Progress Notes (Signed)
PROGRESS NOTE    Sue Roach  ZOX:[id number] DOB: 1940-08-29 DOA: 11/04/2016 PCP: Kaleen Mask, MD   Brief Narrative:  Sue Roach is a 76 y.o. 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 Patient reports that 3 days ago she developed a scratchy, sore throat with a headache and persistent coughing of clear mucoid sputum. No fevers no chills. No significant change in chronic lower extremity edema. She did use a prescribed cream on her left lower extremity yesterday but her skin began to burn and she removed the cream and noticed a residual redness/erythema after removal of the cream. She went to her PCP office today for she was found to be hypoxemic. She does not wear chronic oxygen. Upon presentation to the ER on room air her saturations were 60%. Chest x-ray revealed early edema without effusion and cardiomegaly. She was also found to be in Atrial Flutter. Patient was admitted to SDU and Cardiology was consulted. Patient was placed on Anticoagulation and also placed on a Lasix gtt. This AM patient was severely wheezing but was refusing to go on BiPAP. She decompensated and was very Lethargic and found to be Hypercapnic with CO2 Narcosis so a Rapid was called but I was never notified by Nursing. Dr. Royann Shivers called me to tell me what happened as he was in the Room when events took place and he consulted Dr. Isaiah Serge of PCCM. PCCM evaluated and convinced patient to wear BiPAP and patient wore BiPAP intermittently until Saturations started dropping so then she agreed to go back on BiPAP. Patient ultimately refused BiPAP last night.   Assessment & Plan:   Principal Problem:   Acute on chronic right-sided congestive heart failure (HCC) Active Problems:   Diabetes mellitus without complication (HCC)  Hypertension, uncontrolled   Hypothyroidism, adult   Acute respiratory failure with hypoxemia (HCC)   CKD (chronic kidney disease) stage 4, GFR 15-29 ml/min (HCC)   Chronic anemia   Atrial flutter (HCC)  Acute respiratory failure with Hypercapnea and hypoxemia 2/2 Acute on chronic Biventricular congestive heart failure/Pulmonary HTN, OSA, and Rhinovirus -Presented with 3 days of cough headache and shortness of breath with x-ray findings of edema with mild elevation in BNP and troponin concerning acute heart failure exacerbation -On Lasix gtt and Cardiology managing  -Cardiology Consulted and appreciated Eval -Continue Carvedilol during acute exacerbation -No ACE I 2/2 CKD 4 -Continue supportive care with oxygen; Refusing BiPAP now  -Daily weights, strict I/O; Patient is -1.238 Liters and +3lbs -Last echocardiogram completed December 2017: Preserved LV function, moderate LVH, moderate decreased right systolic function, mild pulmonary hypertension 36 mmHg, mild tricuspid regurgitation -  -Repeat ECHO pending -Suspect mild elevation in troponin secondary to acute heart failure exacerbation-cardiology consulted by EDP-continue to cycle troponin -Added IV Steroids, Xopenex/Atrovent, Flutter Valve, Incentive Spirometer, Mucinex, and Added Doxycycline -PCCM evaluated because patient was extremely lethargic this afternoon -Cardiology considering adding Metolazone -C/w BiPAP   Atrial flutter/RBBB -New onset and rate controlled -Likely related to underlying pulmonary issues/right atrial dilatation and acute hypoxia -Cardiology consultation appreciated -CHASDVASc=6 -Patient started on NOAC with Apixaban -Rate is controlled on BB  Diabetes mellitus without complication  -Discontinue dActos in setting of heart failure -hold Amaryl -Novolog SSI AC/HS -HgbA1c pending  -CBG's ranging from 248-268  Hypertension, uncontrolled -Likely secondary to acute heart exacerbation -Continue  hydralazine -Likely can resume carvedilol once proves  hemodynamically stable with aggressive IV diuresis with Lasix gtt  CKD (chronic kidney disease) stage 4, GFR 15-29 ml/min -Renal function stable and at baseline for patient -BUN/Cr went from 45/1.93 -> 56/2.11  COPD -Has wheezing and combination of CHF; Wheezing has improved -Continue preadmission Ventolin inhaler -Given paroxysmal coughing may have viral syndrome so checked respiratory viral panel and was +for Rhinovirus -Patient reports has never had coughing episode like this before and has had several breathing treatments over the past 2 days which have not improved her symptoms -As Above, Added IV Steroids, Xopenex/Atrovent, Flutter Valve, Incentive Spirometer, Mucinex, and Added Doxycycline -Refusing BiPAP   Hypothyroidism, adult -Continue Synthroid  Chronic anemia -Hemoglobin stable and at baseline -Hb/Hct went from 10.1/32.8 -> 10.7/36.6 -Continue to Monitor and Repeat CBC in AM   L leg redness/erythema:   -Sounds like reaction to cream she used from her PCP.  Would continue to monitor.  -Improving   Prolonged QT:  Repeat EKG in AM  DVT prophylaxis: Anticoagulated with Apixaban Code Status: DO NOT RESUSCITATE Family Communication: No family present at bedside Disposition Plan: Remain in SDU  Consultants:   Cardiology  PCCM   Procedures:  ECHOCARDIOGRAM   Antimicrobials:  Anti-infectives    Start     Dose/Rate Route Frequency Ordered Stop   11/05/16 1700  doxycycline (VIBRA-TABS) tablet 100 mg     100 mg Oral Every 12 hours 11/05/16 1502       Subjective: Seen this AM and stated breathing was slightly better. States Lasix gtt was not enough and she was taking more lasix at home. Refused BiPAP again. Still hard to breathe but not as much per patient. Unhappy and was a little combative but thankful that she was started on Abx.    Objective: Vitals:   11/06/16 0927 11/06/16 1143 11/06/16 1533  11/06/16 1539  BP:   129/64   Pulse:   (!) 57   Resp:   20   Temp:  (!) 97.4 F (36.3 C) 97.6 F (36.4 C)   TempSrc:  Oral Oral   SpO2: 90%  (!) 2% 95%  Weight:      Height:        Intake/Output Summary (Last 24 hours) at 11/06/16 1901 Last data filed at 11/06/16 1800  Gross per 24 hour  Intake              690 ml  Output             1200 ml  Net             -510 ml   Filed Weights   11/04/16 1915 11/05/16 0439 11/06/16 0320  Weight: 115.4 kg (254 lb 6.4 oz) 116.9 kg (257 lb 11.2 oz) 118 kg (260 lb 3.2 oz)   Examination: Physical Exam:  Constitutional: WN/WD obese Caucasian female in some Respiratory Distress Eyes: Sclerae anicteric. Lids normal ENMT: External ears and nose appear normal. Grossly normal hearing. MMM Neck: Supple with Some JVD but difficult to tell Respiratory: Diminished to auscultation bilaterally. Wheezing improved and has some crackles. Wearing Supplemental O2 via [place] Cardiovascular: Irregular Rhythm but normal rate. 2+ LE Edema Abdomen: Soft, NT. Distended due to body habitus. Bowel sounds present GU: Deferred; Purewick in place Musculoskeletal: No clubbing. No appreciable cyanosis Skin: Left leg redness improved. No rashes or lesions an ulcers Neurologic: CN 2-12 grossly intact with no appreciable focal deficits Psychiatric: Agitated Mood. Intact judgement and insight. Alert and Awake and Oriented x3.   Data  Reviewed: I have personally reviewed following labs and imaging studies  CBC:  Recent Labs Lab 11/04/16 1315 11/06/16 0340  WBC 7.1 6.3  NEUTROABS 5.0 5.6  HGB 10.1* 10.7*  HCT 32.8* 36.6  MCV 86.8 89.3  PLT 240 281   Basic Metabolic Panel:  Recent Labs Lab 11/04/16 1315 11/05/16 0216 11/06/16 0340  NA 138 139 136  K 3.4* 3.6 4.8  CL 100* 100* 100*  CO2 GLUCOSE 74 108* 121*  BUN 45* 45* 56*  CREATININE 1.95* 1.93* 2.11*  CALCIUM 8.5* 8.4* 8.5*  MG  --  2.5* 2.6*  PHOS  --   --  4.9*   GFR: Estimated  Creatinine Clearance: 26.3 mL/min (A) (by C-G formula based on SCr of 2.11 mg/dL (H)). Liver Function Tests:  Recent Labs Lab 11/06/16 0340  AST 26  ALT 21  ALKPHOS 96  BILITOT 1.0  PROT 7.6  ALBUMIN 3.2*   No results for input(s): LIPASE, AMYLASE in the last 168 hours. No results for input(s): AMMONIA in the last 168 hours. Coagulation Profile: No results for input(s): INR, PROTIME in the last 168 hours. Cardiac Enzymes:  Recent Labs Lab 11/04/16 1412 11/04/16 2042 11/05/16 0216 11/05/16 1009  TROPONINI 0.06* 0.06* 0.06* 0.05*   BNP (last 3 results) No results for input(s): PROBNP in the last 8760 hours. HbA1C:  Recent Labs  11/04/16 2042  HGBA1C 6.7*   CBG:  Recent Labs Lab 11/04/16 1904 11/04/16 2043 11/05/16 0640 11/06/16 1146 11/06/16 1620  GLUCAP 178* 129* 90 248* 268*   Lipid Profile: No results for input(s): CHOL, HDL, LDLCALC, TRIG, CHOLHDL, LDLDIRECT in the last 72 hours. Thyroid Function Tests: No results for input(s): TSH, T4TOTAL, FREET4, T3FREE, THYROIDAB in the last 72 hours. Anemia Panel: No results for input(s): VITAMINB12, FOLATE, FERRITIN, TIBC, IRON, RETICCTPCT in the last 72 hours. Sepsis Labs: No results for input(s): PROCALCITON, LATICACIDVEN in the last 168 hours.  Recent Results (from the past 240 hour(s))  MRSA PCR Screening     Status: None   Collection Time: 11/04/16 10:09 PM  Result Value Ref Range Status   MRSA by PCR NEGATIVE NEGATIVE Final    Comment:        The GeneXpert MRSA Assay (FDA approved for NASAL specimens only), is one component of a comprehensive MRSA colonization surveillance program. It is not intended to diagnose MRSA infection nor to guide or monitor treatment for MRSA infections.   Respiratory Panel by PCR     Status: Abnormal   Collection Time: 11/05/16  3:50 AM  Result Value Ref Range Status   Adenovirus NOT DETECTED NOT DETECTED Final   Coronavirus 229E NOT DETECTED NOT DETECTED Final    Coronavirus HKU1 NOT DETECTED NOT DETECTED Final   Coronavirus NL63 NOT DETECTED NOT DETECTED Final   Coronavirus OC43 NOT DETECTED NOT DETECTED Final   Metapneumovirus NOT DETECTED NOT DETECTED Final   Rhinovirus / Enterovirus DETECTED (A) NOT DETECTED Final   Influenza A NOT DETECTED NOT DETECTED Final   Influenza A H1 NOT DETECTED NOT DETECTED Final   Influenza A H1 2009 NOT DETECTED NOT DETECTED Final   Influenza A H3 NOT DETECTED NOT DETECTED Final   Influenza B NOT DETECTED NOT DETECTED Final   Parainfluenza Virus 1 NOT DETECTED NOT DETECTED Final   Parainfluenza Virus 2 NOT DETECTED NOT DETECTED Final   Parainfluenza Virus 3 NOT DETECTED NOT DETECTED Final   Parainfluenza Virus 4 NOT DETECTED NOT DETECTED Final  Respiratory Syncytial Virus NOT DETECTED NOT DETECTED Final   Bordetella pertussis NOT DETECTED NOT DETECTED Final   Chlamydophila pneumoniae NOT DETECTED NOT DETECTED Final   Mycoplasma pneumoniae NOT DETECTED NOT DETECTED Final    Radiology Studies: Dg Chest 2 View  Result Date: 11/05/2016 CLINICAL DATA:  CHF. EXAM: CHEST  2 VIEW COMPARISON:  Chest x-ray dated November 04, 2016. FINDINGS: Stable moderate cardiomegaly. Mild pulmonary vascular congestion. Mild basal predominant interstitial thickening, similar to prior study. Bibasilar atelectasis. No focal consolidation, pleural effusion, or pneumothorax. No acute osseous abnormality. IMPRESSION: Stable cardiomegaly with mild interstitial edema. Electronically Signed   By: Obie Dredge M.D.   On: 11/05/2016 08:42   Scheduled Meds: . apixaban  5 mg Oral BID  . aspirin EC  81 mg Oral Daily  . benzonatate  100 mg Oral BID  . bisoprolol  10 mg Oral Daily  . dextromethorphan  15 mg Oral BID  . doxycycline  100 mg Oral Q12H  . guaiFENesin  1,200 mg Oral BID  . hydrALAZINE  25 mg Oral TID  . Influenza vac split quadrivalent PF  0.5 mL Intramuscular Tomorrow-1000  . insulin aspart  0-5 Units Subcutaneous QHS  . insulin  aspart  0-9 Units Subcutaneous TID WC  . ipratropium  0.5 mg Nebulization Q6H  . levalbuterol  0.63 mg Nebulization Q6H  . levothyroxine  25 mcg Oral QAC breakfast  . methylPREDNISolone (SOLU-MEDROL) injection  60 mg Intravenous Q12H  . potassium chloride SA  40 mEq Oral BID  . sodium chloride flush  3 mL Intravenous Q12H   Continuous Infusions: . sodium chloride    . furosemide (LASIX) infusion 10 mg/hr (11/06/16 0500)    LOS: 1 day   Merlene Laughter, DO [facility] Pager [phone]  If 7PM-7AM, please contact night-coverage [email or web address] Password [email or web address] 11/06/2016, 7:01 PM

## 2016-11-07 ENCOUNTER — Inpatient Hospital Stay (HOSPITAL_COMMUNITY): Payer: Medicare Other

## 2016-11-07 DIAGNOSIS — I340 Nonrheumatic mitral (valve) insufficiency: Secondary | ICD-10-CM

## 2016-11-07 DIAGNOSIS — I361 Nonrheumatic tricuspid (valve) insufficiency: Secondary | ICD-10-CM

## 2016-11-07 DIAGNOSIS — R140 Abdominal distension (gaseous): Secondary | ICD-10-CM

## 2016-11-07 LAB — COMPREHENSIVE METABOLIC PANEL
ALT: 25 U/L (ref 14–54)
AST: 27 U/L (ref 15–41)
Albumin: 3 g/dL — ABNORMAL LOW (ref 3.5–5.0)
Alkaline Phosphatase: 88 U/L (ref 38–126)
Anion gap: 4 — ABNORMAL LOW (ref 5–15)
BILIRUBIN TOTAL: 0.7 mg/dL (ref 0.3–1.2)
BUN: 81 mg/dL — AB (ref 6–20)
CHLORIDE: 100 mmol/L — AB (ref 101–111)
CO2: 30 mmol/L (ref 22–32)
CREATININE: 2.35 mg/dL — AB (ref 0.44–1.00)
Calcium: 8.5 mg/dL — ABNORMAL LOW (ref 8.9–10.3)
GFR, EST AFRICAN AMERICAN: 22 mL/min — AB (ref >60)
GFR, EST NON AFRICAN AMERICAN: 19 mL/min — AB (ref >60)
Glucose, Bld: 187 mg/dL — ABNORMAL HIGH (ref 65–99)
POTASSIUM: 5.3 mmol/L — AB (ref 3.5–5.1)
Sodium: 134 mmol/L — ABNORMAL LOW (ref 135–145)
TOTAL PROTEIN: 6.9 g/dL (ref 6.5–8.1)

## 2016-11-07 LAB — ECHOCARDIOGRAM COMPLETE
Height: 60 in
Weight: 4232.83 oz

## 2016-11-07 LAB — CBC WITH DIFFERENTIAL/PLATELET
BASOS ABS: 0 10*3/uL (ref 0.0–0.1)
Basophils Relative: 0 %
Eosinophils Absolute: 0 10*3/uL (ref 0.0–0.7)
Eosinophils Relative: 0 %
HEMATOCRIT: 33.5 % — AB (ref 36.0–46.0)
Hemoglobin: 9.9 g/dL — ABNORMAL LOW (ref 12.0–15.0)
LYMPHS PCT: 6 %
Lymphs Abs: 0.5 10*3/uL — ABNORMAL LOW (ref 0.7–4.0)
MCH: 25.8 pg — ABNORMAL LOW (ref 26.0–34.0)
MCHC: 29.6 g/dL — ABNORMAL LOW (ref 30.0–36.0)
MCV: 87.5 fL (ref 78.0–100.0)
Monocytes Absolute: 0.4 10*3/uL (ref 0.1–1.0)
Monocytes Relative: 4 %
NEUTROS ABS: 8.3 10*3/uL — AB (ref 1.7–7.7)
Neutrophils Relative %: 90 %
PLATELETS: 280 10*3/uL (ref 150–400)
RBC: 3.83 MIL/uL — ABNORMAL LOW (ref 3.87–5.11)
RDW: 18.7 % — ABNORMAL HIGH (ref 11.5–15.5)
WBC: 9.2 10*3/uL (ref 4.0–10.5)

## 2016-11-07 LAB — GLUCOSE, CAPILLARY
GLUCOSE-CAPILLARY: 210 mg/dL — AB (ref 65–99)
Glucose-Capillary: 210 mg/dL — ABNORMAL HIGH (ref 65–99)
Glucose-Capillary: 281 mg/dL — ABNORMAL HIGH (ref 65–99)

## 2016-11-07 LAB — PHOSPHORUS: PHOSPHORUS: 4.6 mg/dL (ref 2.5–4.6)

## 2016-11-07 LAB — MAGNESIUM: MAGNESIUM: 2.5 mg/dL — AB (ref 1.7–2.4)

## 2016-11-07 MED ORDER — PERFLUTREN LIPID MICROSPHERE
INTRAVENOUS | Status: AC
  Filled 2016-11-07: qty 10

## 2016-11-07 MED ORDER — INSULIN GLARGINE 100 UNIT/ML ~~LOC~~ SOLN
12.0000 [IU] | Freq: Every day | SUBCUTANEOUS | Status: DC
  Administered 2016-11-07 – 2016-11-14 (×8): 12 [IU] via SUBCUTANEOUS
  Filled 2016-11-07 (×12): qty 0.12

## 2016-11-07 MED ORDER — IPRATROPIUM BROMIDE 0.02 % IN SOLN
0.5000 mg | Freq: Three times a day (TID) | RESPIRATORY_TRACT | Status: DC
  Administered 2016-11-07 – 2016-11-09 (×6): 0.5 mg via RESPIRATORY_TRACT
  Filled 2016-11-07 (×6): qty 2.5

## 2016-11-07 MED ORDER — METHYLPREDNISOLONE SODIUM SUCC 40 MG IJ SOLR
40.0000 mg | Freq: Two times a day (BID) | INTRAMUSCULAR | Status: DC
  Administered 2016-11-07 – 2016-11-09 (×4): 40 mg via INTRAVENOUS
  Filled 2016-11-07 (×5): qty 1

## 2016-11-07 MED ORDER — LEVALBUTEROL HCL 0.63 MG/3ML IN NEBU
0.6300 mg | INHALATION_SOLUTION | Freq: Three times a day (TID) | RESPIRATORY_TRACT | Status: DC
  Administered 2016-11-07 – 2016-11-09 (×6): 0.63 mg via RESPIRATORY_TRACT
  Filled 2016-11-07 (×6): qty 3

## 2016-11-07 NOTE — Progress Notes (Signed)
While speaking with patient she stated that she wanted everything done to save her life I explained that she was a DNR and she stated that she wanted everything done I explained to her in laymen terms and she still stated emphatically that she wanted to be a full code spoke to the NP on call and order changed will continue to monitor

## 2016-11-07 NOTE — Progress Notes (Signed)
  Echocardiogram 2D Echocardiogram has been performed.  Nysir Fergusson 11/07/2016, 3:09 PM

## 2016-11-07 NOTE — Progress Notes (Signed)
PROGRESS NOTE    Sue Roach  RUE:[id number] DOB: Apr 27, 1940 DOA: 11/04/2016 PCP: Kaleen Mask, MD   Brief Narrative:  Sue Roach is a 76 y.o. 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 Patient reports that 3 days ago she developed a scratchy, sore throat with a headache and persistent coughing of clear mucoid sputum. No fevers no chills. No significant change in chronic lower extremity edema. She did use a prescribed cream on her left lower extremity yesterday but her skin began to burn and she removed the cream and noticed a residual redness/erythema after removal of the cream. She went to her PCP office today for she was found to be hypoxemic. She does not wear chronic oxygen. Upon presentation to the ER on room air her saturations were 60%. Chest x-ray revealed early edema without effusion and cardiomegaly.   She was also found to be in Atrial Flutter. Patient was admitted to SDU and Cardiology was consulted. Patient was placed on Anticoagulation and also placed on a Lasix gtt. She had severe wheezing but was refusing to go on BiPAP. She decompensated and was very Lethargic and found to be Hypercapnic with CO2 Narcosis so a Rapid was called but I was never notified by Nursing on 11/05/16. Dr. Royann Shivers called me to tell me what happened as he was in the Room when events took place and he consulted Dr. Isaiah Serge of PCCM.   PCCM evaluated and convinced patient to wear BiPAP and patient wore BiPAP intermittently until Saturations started dropping so then she agreed to go back on BiPAP. Patient ultimately refused BiPAP again. Cardiology evaluated and D/C'd Lasix gtt as Cr was rising; Cardiology worried about Cardiorenal but ECHO was normal. They may consider Iontropic Assistance for Diuresis.    Assessment & Plan:   Principal Problem:   Acute on chronic right-sided congestive heart failure (HCC) Active Problems:   Diabetes mellitus without complication (HCC)   Hypertension, uncontrolled   Hypothyroidism, adult   Acute respiratory failure with hypoxemia (HCC)   CKD (chronic kidney disease) stage 4, GFR 15-29 ml/min (HCC)   Chronic anemia   Atrial flutter (HCC)  Acute respiratory failure with Hypercapnea and hypoxemia 2/2 Acute on chronic Biventricular congestive heart failure/Pulmonary HTN, OSA, and Rhinovirus -Presented with 3 days of cough headache and shortness of breath with x-ray findings of edema with mild elevation in BNP and troponin concerning acute heart failure exacerbation -On Lasix gtt and Cardiology managing and now D/C'd due to worsening Cr  -Cardiology Consulted and appreciated Eval -Continue Carvedilol during acute exacerbation -No ACE I 2/2 CKD 4 -Continue supportive care with oxygen; Refusing BiPAP now  -Daily weights, strict I/O; Patient is -1.238 Liters and +3lbs -Last echocardiogram completed December 2017: Preserved LV function, moderate LVH, moderate decreased right systolic function, mild pulmonary hypertension 36 mmHg, mild tricuspid regurgitation -  -Repeat ECHO showed EF of 55-60% with RV Volume and Pressure Overload  -Suspect mild elevation in troponin secondary to acute heart failure exacerbation-cardiology consulted by EDP-continue to cycle troponin -Added IV Steroids, Xopenex/Atrovent, Flutter Valve, Incentive Spirometer, Mucinex, and Added Doxycycline; IV Steroids decreased -Fluid Restricted the patient to 1200 mL -PCCM evaluated and appreciated assistance -C/w BiPAP but patient refusing  -Cardiology suggesting that patient may need Iontropic Assistance for Diuresis; Diureses deferred to Cardiology as they are managing.    Atrial flutter/RBBB -New onset  and rate controlled -Likely related to underlying pulmonary issues/right atrial  dilatation and acute hypoxia -Cardiology consultation appreciated -CHASDVASc=6 -Patient started on NOAC with Apixaban but patient does not want to continue and wants to switch to Coumadin given prohibitive Cost; Will discuss with Cardiology and if that is the case will need Heparin gtt for bridging or Lovenox Injections  -Rate is controlled on BB  Diabetes mellitus without complication  -Discontinued Actos in setting of heart failure -hold Amaryl -Novolog SSI AC/HS and will add Lantus 12 units per Diabetic Education Recommendations  -HgbA1c 6.7  -CBG's ranging from 210-281  Hypertension, uncontrolled -Likely secondary to acute heart exacerbation -Continue hydralazine -Likely can resume carvedilol once proves hemodynamically stable  -Cardiology   CKD (chronic kidney disease) stage 4, GFR 15-29 ml/min -BUN/Cr went from 45/1.93 -> 56/2.11 -> 81/2.35 so Cardiology stopped Lasix gtt -Continue to Monitor and ?Cardiorenal  COPD -Has wheezing and combination of CHF; Wheezing has improved -Continue preadmission Ventolin inhaler -Given paroxysmal coughing may have viral syndrome so checked respiratory viral panel and was +for Rhinovirus -Patient reports has never had coughing episode like this before and has had several breathing treatments over the past 2 days which have not improved her symptoms -As Above, Added IV Steroids, Xopenex/Atrovent, Flutter Valve, Incentive Spirometer, Mucinex, and Added Doxycycline -Refusing BiPAP   Hypothyroidism, adult -Continue Synthroid  Chronic anemia -Hemoglobin stable and at baseline -Hb/Hct went from 10.1/32.8 -> 10.7/36.6 -> 9.9/33.5 -Continue to Monitor and Repeat CBC in AM   L leg redness/erythema:   -Sounds like reaction to cream she used from her PCP.  Would continue to monitor.  -Improving   Prolonged QT:  Repeat EKG in AM  Hyperkalemia -Mild at 5.3 -Hold Potassium Supplementation and Recheck in AM -Continue to Monitor and  Repeat CMP in AM  Abdominal Firmness -Patient complained that her abdomen was firmer -Abdominal Flat Plate obtained and showed The bowel gas pattern is normal. No radio-opaque calculi or other significant radiographic abnormality are seen. No visible free air or free fluid. No discrete bone abnormality.  DVT prophylaxis: Anticoagulated with Apixaban Code Status: DO NOT RESUSCITATE Family Communication: No family present at bedside Disposition Plan: Remain in SDU  Consultants:   Cardiology  PCCM   Procedures:  ECHOCARDIOGRAM Study Conclusions  - Left ventricle: The cavity size was mildly dilated. Wall   thickness was normal. Systolic function was normal. The estimated   ejection fraction was in the range of 55% to 60%. Wall motion was   normal; there were no regional wall motion abnormalities. - Ventricular septum: The contour showed diastolic flattening and   systolic flattening. These changes are consistent with RV volume   and pressure overload. - Mitral valve: There was mild regurgitation. Valve area by   continuity equation (using LVOT flow): 2.43 cm^2. - Left atrium: The atrium was severely dilated. - Right ventricle: The cavity size was moderately dilated. Systolic   function was moderately reduced. - Right atrium: The atrium was severely dilated. - Tricuspid valve: There was malcoaptation of the valve leaflets.   There was moderate-severe regurgitation directed centrally.   Diastolic regurgitation was present. - Pulmonary arteries: Systolic pressure was moderately increased.   PA peak pressure: 58 mm Hg (S).    Antimicrobials:  Anti-infectives    Start     Dose/Rate Route Frequency Ordered Stop   11/05/16 1700  doxycycline (VIBRA-TABS) tablet 100 mg     100 mg Oral Every 12 hours 11/05/16 1502  Subjective: Seen this AM and stated she was drinking whatever they brought her. Didn't like the lasix gtt. Complained of abdominal firmness this AM and states  she is having BM. No nausea or vomiting. Seems irritated again today.   Objective: Vitals:   11/07/16 1427 11/07/16 1548 11/07/16 1809 11/07/16 2008  BP: 140/73  (!) 158/67 (!) 144/77  Pulse: (!) 56  (!) 58 (!) 56  Resp: 20  18 (!) 22  Temp:  98.4 F (36.9 C)  (!) 97.4 F (36.3 C)  TempSrc:  Oral  Oral  SpO2:  94% 96% 90%  Weight:      Height:        Intake/Output Summary (Last 24 hours) at 11/07/16 2033 Last data filed at 11/07/16 2010  Gross per 24 hour  Intake             1420 ml  Output              675 ml  Net              745 ml   Filed Weights   11/06/16 0320 11/07/16 0346 11/07/16 0354  Weight: 118 kg (260 lb 3.2 oz) 120 kg (264 lb 8.8 oz) 120 kg (264 lb 8.8 oz)   Examination: Physical Exam:  Constitutional: WN/WD obese Caucasian female in NAD sitting in chair bedside Eyes: Sclerae anicteric. Lids normal ENMT: Grossly normal hearing. External ears and nose appear normal. MMM Neck: Supple with some JVD but difficult to assess Respiratory: Diminished with no appreciable wheezing. Slightly tachypenic but not using any accessory muscles to breath   Cardiovascular: Irregular Rhythm and slightly brady rate. 2+ LE edema Abdomen: Slightly firm but NT, Distended due to body habitus. Bowel sounds present GU: Deferred. Purewick no longer in place Musculoskeletal: No contractures. No cyanosis Skin: Warm and dry. No rashes appreciated Neurologic: CN 2-12 grossly intact. No appreciable focal deficits Psychiatric: Again patient was agitated. Intact judgement and insight. Awake and alert   Data Reviewed: I have personally reviewed following labs and imaging studies  CBC:  Recent Labs Lab 11/04/16 1315 11/06/16 0340 11/07/16 0238  WBC 7.1 6.3 9.2  NEUTROABS 5.0 5.6 8.3*  HGB 10.1* 10.7* 9.9*  HCT 32.8* 36.6 33.5*  MCV 86.8 89.3 87.5  PLT 240 281 280   Basic Metabolic Panel:  Recent Labs Lab 11/04/16 1315 11/05/16 0216 11/06/16 0340 11/07/16 0238  NA 138 139  136 134*  K 3.4* 3.6 4.8 5.3*  CL 100* 100* 100* 100*  CO2 28 30 25 30   GLUCOSE 74 108* 121* 187*  BUN 45* 45* 56* 81*  CREATININE 1.95* 1.93* 2.11* 2.35*  CALCIUM 8.5* 8.4* 8.5* 8.5*  MG  --  2.5* 2.6* 2.5*  PHOS  --   --  4.9* 4.6   GFR: Estimated Creatinine Clearance: 23.8 mL/min (A) (by C-G formula based on SCr of 2.35 mg/dL (H)). Liver Function Tests:  Recent Labs Lab 11/06/16 0340 11/07/16 0238  AST 26 27  ALT 21 25  ALKPHOS 96 88  BILITOT 1.0 0.7  PROT 7.6 6.9  ALBUMIN 3.2* 3.0*   No results for input(s): LIPASE, AMYLASE in the last 168 hours. No results for input(s): AMMONIA in the last 168 hours. Coagulation Profile: No results for input(s): INR, PROTIME in the last 168 hours. Cardiac Enzymes:  Recent Labs Lab 11/04/16 1412 11/04/16 2042 11/05/16 0216 11/05/16 1009  TROPONINI 0.06* 0.06* 0.06* 0.05*   BNP (last 3 results) No results for input(s):  PROBNP in the last 8760 hours. HbA1C:  Recent Labs  11/04/16 2042  HGBA1C 6.7*   CBG:  Recent Labs Lab 11/06/16 1146 11/06/16 1620 11/06/16 2126 11/07/16 0623 11/07/16 1142  GLUCAP 248* 268* 262* 210* 281*   Lipid Profile: No results for input(s): CHOL, HDL, LDLCALC, TRIG, CHOLHDL, LDLDIRECT in the last 72 hours. Thyroid Function Tests: No results for input(s): TSH, T4TOTAL, FREET4, T3FREE, THYROIDAB in the last 72 hours. Anemia Panel: No results for input(s): VITAMINB12, FOLATE, FERRITIN, TIBC, IRON, RETICCTPCT in the last 72 hours. Sepsis Labs: No results for input(s): PROCALCITON, LATICACIDVEN in the last 168 hours.  Recent Results (from the past 240 hour(s))  MRSA PCR Screening     Status: None   Collection Time: 11/04/16 10:09 PM  Result Value Ref Range Status   MRSA by PCR NEGATIVE NEGATIVE Final    Comment:        The GeneXpert MRSA Assay (FDA approved for NASAL specimens only), is one component of a comprehensive MRSA colonization surveillance program. It is not intended to  diagnose MRSA infection nor to guide or monitor treatment for MRSA infections.   Respiratory Panel by PCR     Status: Abnormal   Collection Time: 11/05/16  3:50 AM  Result Value Ref Range Status   Adenovirus NOT DETECTED NOT DETECTED Final   Coronavirus 229E NOT DETECTED NOT DETECTED Final   Coronavirus HKU1 NOT DETECTED NOT DETECTED Final   Coronavirus NL63 NOT DETECTED NOT DETECTED Final   Coronavirus OC43 NOT DETECTED NOT DETECTED Final   Metapneumovirus NOT DETECTED NOT DETECTED Final   Rhinovirus / Enterovirus DETECTED (A) NOT DETECTED Final   Influenza A NOT DETECTED NOT DETECTED Final   Influenza A H1 NOT DETECTED NOT DETECTED Final   Influenza A H1 2009 NOT DETECTED NOT DETECTED Final   Influenza A H3 NOT DETECTED NOT DETECTED Final   Influenza B NOT DETECTED NOT DETECTED Final   Parainfluenza Virus 1 NOT DETECTED NOT DETECTED Final   Parainfluenza Virus 2 NOT DETECTED NOT DETECTED Final   Parainfluenza Virus 3 NOT DETECTED NOT DETECTED Final   Parainfluenza Virus 4 NOT DETECTED NOT DETECTED Final   Respiratory Syncytial Virus NOT DETECTED NOT DETECTED Final   Bordetella pertussis NOT DETECTED NOT DETECTED Final   Chlamydophila pneumoniae NOT DETECTED NOT DETECTED Final   Mycoplasma pneumoniae NOT DETECTED NOT DETECTED Final    Radiology Studies: Dg Abd 1 View  Result Date: 11/07/2016 CLINICAL DATA:  Abdominal distention. EXAM: ABDOMEN - 1 VIEW COMPARISON:  None. FINDINGS: The bowel gas pattern is normal. No radio-opaque calculi or other significant radiographic abnormality are seen. No visible free air or free fluid. No discrete bone abnormality. IMPRESSION: Benign-appearing abdomen. Electronically Signed   By: Francene Boyers M.D.   On: 11/07/2016 13:56   Scheduled Meds: . apixaban  5 mg Oral BID  . aspirin EC  81 mg Oral Daily  . benzonatate  100 mg Oral BID  . bisoprolol  10 mg Oral Daily  . dextromethorphan  15 mg Oral BID  . doxycycline  100 mg Oral Q12H  .  guaiFENesin  1,200 mg Oral BID  . hydrALAZINE  25 mg Oral TID  . Influenza vac split quadrivalent PF  0.5 mL Intramuscular Tomorrow-1000  . insulin aspart  0-5 Units Subcutaneous QHS  . insulin aspart  0-9 Units Subcutaneous TID WC  . ipratropium  0.5 mg Nebulization TID  . levalbuterol  0.63 mg Nebulization TID  . levothyroxine  25  mcg Oral QAC breakfast  . methylPREDNISolone (SOLU-MEDROL) injection  60 mg Intravenous Q12H  . perflutren lipid microspheres (DEFINITY) IV suspension      . perflutren lipid microspheres (DEFINITY) IV suspension      . sodium chloride flush  3 mL Intravenous Q12H   Continuous Infusions: . sodium chloride      LOS: 2 days   Merlene Laughter, DO [facility] Pager [phone]  If 7PM-7AM, please contact night-coverage [email or web address] Password [facility] 11/07/2016, 8:33 PM

## 2016-11-07 NOTE — Progress Notes (Signed)
Inpatient Diabetes Program Recommendations  AACE/ADA: New Consensus Statement on Inpatient Glycemic Control (2015)  Target Ranges:  Prepandial:   less than 140 mg/dL      Peak postprandial:   less than 180 mg/dL (1-2 hours)      Critically ill patients:  140 - 180 mg/dL   Lab Results  Component Value Date   GLUCAP 281 (H) 11/07/2016   HGBA1C 6.7 (H) 11/04/2016    Review of Glycemic Control Results for Sue Roach, Sue Roach (MRN [id number]) as of 11/07/2016 14:48  Ref. Range 11/06/2016 11:46 11/06/2016 16:20 11/06/2016 21:26 11/07/2016 06:23 11/07/2016 11:42  Glucose-Capillary Latest Ref Range: 65 - 99 mg/dL 295 (H) 188 (H) 416 (H) 210 (H) 281 (H)   Diabetes history: DM2 Outpatient Diabetes medications: Amaryl 4 mg qd + Actos 45 mg qd Current orders for Inpatient glycemic control: None  Inpatient Diabetes Program Recommendations:  Please consider while while oral medications held, -Lantus 12 units daily  Thank you, Darel Hong E. Rollie Hynek, RN, MSN, CDE  Diabetes Coordinator Inpatient Glycemic Control Team Team Pager [phone] (8am-5pm) 11/07/2016 2:51 PM

## 2016-11-07 NOTE — Progress Notes (Signed)
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 Weight stable with diuresis on lasix gtts. She was net positive overnight. Creatinine continues to rise. K+ elevated today. Repeat echo pending.  Objective   Vitals:   11/07/16 0346 11/07/16 0354 11/07/16 0800 11/07/16 0949  BP:  131/67    Pulse:  (!) 57  (!) 57  Resp:  (!) 22  16  Temp:  (!) 97.5 F (36.4 C)    TempSrc:  Oral    SpO2:  92% 94% 91%  Weight: 264 lb 8.8 oz (120 kg) 264 lb 8.8 oz (120 kg)    Height:        Intake/Output Summary (Last 24 hours) at 11/07/16 1044 Last data filed at 11/07/16 1000  Gross per 24 hour  Intake             1010 ml  Output              600 ml  Net              410 ml   Filed Weights   11/06/16 0320 11/07/16 0346 11/07/16 0354  Weight: 260 lb 3.2 oz (118 kg) 264 lb 8.8 oz (120 kg) 264 lb 8.8 oz (120 kg)    Physical Exam   General appearance: alert, no distress and morbidly obese Neck: JVD - 3 cm above sternal notch, no carotid bruit and thyroid not enlarged, symmetric, no tenderness/mass/nodules Lungs: diminished breath sounds bibasilar and rhonchi LUL and RUL Heart: regular rate and rhythm Abdomen: protuberant, no fluid wave Extremities: edema 2+ edema - chronic venous stasis change, mild lipodermatosclerosis and venous stasis dermatitis noted Pulses: 2+ and symmetric Skin: lipodermatosclerosis Neurologic: Mental status: Alert, oriented, thought  content appropriate Psych: Pleasant  Inpatient Medications    Scheduled Meds: . apixaban  5 mg Oral BID  . aspirin EC  81 mg Oral Daily  . benzonatate  100 mg Oral BID  . bisoprolol  10 mg Oral Daily  . dextromethorphan  15 mg Oral BID  . doxycycline  100 mg Oral Q12H  . guaiFENesin  1,200 mg Oral BID  . hydrALAZINE  25 mg Oral TID  . Influenza vac split quadrivalent PF  0.5 mL Intramuscular Tomorrow-1000  . insulin aspart  0-5 Units Subcutaneous QHS  . insulin aspart  0-9 Units Subcutaneous TID WC  . ipratropium  0.5 mg Nebulization TID  . levalbuterol  0.63 mg Nebulization TID  . levothyroxine  25 mcg Oral QAC breakfast  . methylPREDNISolone (SOLU-MEDROL) injection  60 mg Intravenous Q12H  . sodium chloride flush  3 mL Intravenous Q12H    Continuous Infusions: . sodium chloride    . furosemide (LASIX) infusion 10 mg/hr (11/07/16 1000)    PRN Meds: sodium chloride, acetaminophen, HYDROcodone-homatropine, ondansetron (ZOFRAN) IV, sodium chloride flush   Labs   Results for orders placed or performed during the hospital encounter of 11/04/16 (from the past 48 hour(s))  Glucose, capillary  Status: Abnormal   Collection Time: 11/05/16 12:30 PM  Result Value Ref Range   Glucose-Capillary 154 (H) 65 - 99 mg/dL   Comment 1 Notify RN    Comment 2 Document in Chart   Blood gas, arterial     Status: Abnormal   Collection Time: 11/05/16  1:15 PM  Result Value Ref Range   O2 Content 3.0 L/min   pH, Arterial 7.293 (L) 7.350 - 7.450   pCO2 arterial 62.6 (H) 32.0 - 48.0 mmHg   pO2, Arterial 86.2 83.0 - 108.0 mmHg   Bicarbonate 29.4 (H) 20.0 - 28.0 mmol/L   Acid-Base Excess 3.4 (H) 0.0 - 2.0 mmol/L   O2 Saturation 95.5 %   Patient temperature 98.6    Collection site RIGHT RADIAL    Drawn by [id number]    Sample type ARTERIAL DRAW    Allens test (pass/fail) PASS PASS  Glucose, capillary     Status: Abnormal   Collection Time: 11/05/16  4:24 PM  Result Value Ref Range    Glucose-Capillary 282 (H) 65 - 99 mg/dL   Comment 1 Notify RN    Comment 2 Document in Chart   CBC with Differential/Platelet     Status: Abnormal   Collection Time: 11/06/16  3:40 AM  Result Value Ref Range   WBC 6.3 4.0 - 10.5 K/uL   RBC 4.10 3.87 - 5.11 MIL/uL   Hemoglobin 10.7 (L) 12.0 - 15.0 g/dL   HCT 36.6 36.0 - 46.0 %   MCV 89.3 78.0 - 100.0 fL   MCH 26.1 26.0 - 34.0 pg   MCHC 29.2 (L) 30.0 - 36.0 g/dL   RDW 19.1 (H) 11.5 - 15.5 %   Platelets 281 150 - 400 K/uL   Neutrophils Relative % 90 %   Neutro Abs 5.6 1.7 - 7.7 K/uL   Lymphocytes Relative 7 %   Lymphs Abs 0.4 (L) 0.7 - 4.0 K/uL   Monocytes Relative 3 %   Monocytes Absolute 0.2 0.1 - 1.0 K/uL   Eosinophils Relative 0 %   Eosinophils Absolute 0.0 0.0 - 0.7 K/uL   Basophils Relative 0 %   Basophils Absolute 0.0 0.0 - 0.1 K/uL  Comprehensive metabolic panel     Status: Abnormal   Collection Time: 11/06/16  3:40 AM  Result Value Ref Range   Sodium 136 135 - 145 mmol/L   Potassium 4.8 3.5 - 5.1 mmol/L    Comment: DELTA CHECK NOTED   Chloride 100 (L) 101 - 111 mmol/L   CO2 25 22 - 32 mmol/L   Glucose, Bld 121 (H) 65 - 99 mg/dL   BUN 56 (H) 6 - 20 mg/dL   Creatinine, Ser 2.11 (H) 0.44 - 1.00 mg/dL   Calcium 8.5 (L) 8.9 - 10.3 mg/dL   Total Protein 7.6 6.5 - 8.1 g/dL   Albumin 3.2 (L) 3.5 - 5.0 g/dL   AST 26 15 - 41 U/L   ALT 21 14 - 54 U/L   Alkaline Phosphatase 96 38 - 126 U/L   Total Bilirubin 1.0 0.3 - 1.2 mg/dL   GFR calc non Af Amer 21 (L) >60 mL/min   GFR calc Af Amer 25 (L) >60 mL/min    Comment: (NOTE) The eGFR has been calculated using the CKD EPI equation. This calculation has not been validated in all clinical situations. eGFR's persistently <60 mL/min signify possible Chronic Kidney Disease.    Anion gap 11 5 - 15  Magnesium     Status: Abnormal  Collection Time: 11/06/16  3:40 AM  Result Value Ref Range   Magnesium 2.6 (H) 1.7 - 2.4 mg/dL  Phosphorus     Status: Abnormal   Collection Time:  11/06/16  3:40 AM  Result Value Ref Range   Phosphorus 4.9 (H) 2.5 - 4.6 mg/dL  Glucose, capillary     Status: Abnormal   Collection Time: 11/06/16 11:46 AM  Result Value Ref Range   Glucose-Capillary 248 (H) 65 - 99 mg/dL  Glucose, capillary     Status: Abnormal   Collection Time: 11/06/16  4:20 PM  Result Value Ref Range   Glucose-Capillary 268 (H) 65 - 99 mg/dL   Comment 1 Notify RN    Comment 2 Document in Chart   Glucose, capillary     Status: Abnormal   Collection Time: 11/06/16  9:26 PM  Result Value Ref Range   Glucose-Capillary 262 (H) 65 - 99 mg/dL   Comment 1 Notify RN    Comment 2 Document in Chart   CBC with Differential/Platelet     Status: Abnormal   Collection Time: 11/07/16  2:38 AM  Result Value Ref Range   WBC 9.2 4.0 - 10.5 K/uL   RBC 3.83 (L) 3.87 - 5.11 MIL/uL   Hemoglobin 9.9 (L) 12.0 - 15.0 g/dL   HCT 33.5 (L) 36.0 - 46.0 %   MCV 87.5 78.0 - 100.0 fL   MCH 25.8 (L) 26.0 - 34.0 pg   MCHC 29.6 (L) 30.0 - 36.0 g/dL   RDW 18.7 (H) 11.5 - 15.5 %   Platelets 280 150 - 400 K/uL   Neutrophils Relative % 90 %   Neutro Abs 8.3 (H) 1.7 - 7.7 K/uL   Lymphocytes Relative 6 %   Lymphs Abs 0.5 (L) 0.7 - 4.0 K/uL   Monocytes Relative 4 %   Monocytes Absolute 0.4 0.1 - 1.0 K/uL   Eosinophils Relative 0 %   Eosinophils Absolute 0.0 0.0 - 0.7 K/uL   Basophils Relative 0 %   Basophils Absolute 0.0 0.0 - 0.1 K/uL  Comprehensive metabolic panel     Status: Abnormal   Collection Time: 11/07/16  2:38 AM  Result Value Ref Range   Sodium 134 (L) 135 - 145 mmol/L   Potassium 5.3 (H) 3.5 - 5.1 mmol/L   Chloride 100 (L) 101 - 111 mmol/L   CO2 30 22 - 32 mmol/L   Glucose, Bld 187 (H) 65 - 99 mg/dL   BUN 81 (H) 6 - 20 mg/dL   Creatinine, Ser 2.35 (H) 0.44 - 1.00 mg/dL   Calcium 8.5 (L) 8.9 - 10.3 mg/dL   Total Protein 6.9 6.5 - 8.1 g/dL   Albumin 3.0 (L) 3.5 - 5.0 g/dL   AST 27 15 - 41 U/L   ALT 25 14 - 54 U/L   Alkaline Phosphatase 88 38 - 126 U/L   Total Bilirubin  0.7 0.3 - 1.2 mg/dL   GFR calc non Af Amer 19 (L) >60 mL/min   GFR calc Af Amer 22 (L) >60 mL/min    Comment: (NOTE) The eGFR has been calculated using the CKD EPI equation. This calculation has not been validated in all clinical situations. eGFR's persistently <60 mL/min signify possible Chronic Kidney Disease.    Anion gap 4 (L) 5 - 15  Phosphorus     Status: None   Collection Time: 11/07/16  2:38 AM  Result Value Ref Range   Phosphorus 4.6 2.5 - 4.6 mg/dL  Magnesium  Status: Abnormal   Collection Time: 11/07/16  2:38 AM  Result Value Ref Range   Magnesium 2.5 (H) 1.7 - 2.4 mg/dL  Glucose, capillary     Status: Abnormal   Collection Time: 11/07/16  6:23 AM  Result Value Ref Range   Glucose-Capillary 210 (H) 65 - 99 mg/dL    ECG   N/A  Telemetry   Atrial flutter in 50's - Personally Reviewed  Radiology    No results found.  Cardiac Studies   Pending echo  Assessment   1. Principal Problem: 2.   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 Active Problems: 4.   Diabetes mellitus without complication ([place]) 5.   Hypertension, uncontrolled 6.   Hypothyroidism, adult 7.   Acute respiratory failure with hypoxemia (HCC) 8.   CKD (chronic kidney disease) stage 4, GFR 15-29 ml/min (HCC) 9.   Chronic anemia 10.   Atrial flutter ([place]) 11.   Plan   1. Creatinine rising with decreased urine output - still appears volume overloaded, but will d/c lasix gtts today. Monitor creatinine. Could be a cardiorenal syndrome. Await echo, hopefully today - if LVEF is significantly declined, would consider this. Then may need inotropic assistance for diuresis. Treat hyperkalemia per medicine service.  Time Spent Directly with Patient:  I have spent a total of 25 minutes with the patient reviewing hospital notes, telemetry, EKGs, labs and examining the patient as well as establishing an assessment and plan that was discussed personally with the patient. > 50% of  time was spent in direct patient care.  Length of Stay:   LOS: 2 days   Pixie Casino, MD, [place]  Attending Cardiologist  Direct Dial: [phone]  Fax: [phone]  Website:  [email or web address] Wayden [email or web address] 11/07/2016, 10:44 AM

## 2016-11-07 NOTE — Care Management Note (Signed)
Case Management Note Donn Pierini RN, BSN Unit 4E-Case Manager [phone]  Patient Details  Name: Sue Roach MRN: [id number] Date of Birth: [birth year]  Subjective/Objective:   Pt admitted with acute on chronic HF and afib                 Action/Plan: PTA pt lived at home, has home bipap however is not on home 02- referral for Eliquis needs- pt concerned about cost- insurance check completed- per [employer]- pt has copay of 35% of total cost of drug - (if drug is around $400 for example then copay would be about $140)- spoke with pt at bedside- pt states that she can not afford this cost for Eliquis- pt is on a Medicare drug plan and is not eligible for co pay assist card- discussed with pt to try and apply for assistance with drug company- pt states she has tried this in past and was told she has too much income. Pt states that she does not see any need to start Eliquis even with 30 day free card- and wants to speak with MD about alternative such as Coumadin?? CM to continue to follow for any further DC needs  Expected Discharge Date:                  Expected Discharge Plan:  Home/Self Care  In-House Referral:     Discharge planning Services  CM Consult, Medication Assistance  Post Acute Care Choice:    Choice offered to:     DME Arranged:    DME Agency:     HH Arranged:    HH Agency:     Status of Service:  In process, will continue to follow  If discussed at Long Length of Stay Meetings, dates discussed:    Discharge Disposition:   Additional Comments:  Darrold Span, RN 11/07/2016, 4:17 PM

## 2016-11-07 NOTE — Progress Notes (Signed)
Per insurance check for Eliquis # 2. S/W  REYNA @ [employer] # [id number]   ELIQUIS 5 MG BID   COVER- YES  CO-PAY- 35 % OF TOTAL COAST- Q/L OF ONE PILL PER DAYS  TIER- 3 DRUG  PRIOR APPROVAL- YES # [phone] OPT- 5   PHARMACY : [facility]

## 2016-11-08 DIAGNOSIS — I10 Essential (primary) hypertension: Secondary | ICD-10-CM

## 2016-11-08 DIAGNOSIS — E119 Type 2 diabetes mellitus without complications: Secondary | ICD-10-CM

## 2016-11-08 LAB — GLUCOSE, CAPILLARY
GLUCOSE-CAPILLARY: 195 mg/dL — AB (ref 65–99)
GLUCOSE-CAPILLARY: 216 mg/dL — AB (ref 65–99)
Glucose-Capillary: 140 mg/dL — ABNORMAL HIGH (ref 65–99)
Glucose-Capillary: 268 mg/dL — ABNORMAL HIGH (ref 65–99)
Glucose-Capillary: 208 mg/dL — ABNORMAL HIGH (ref 65–99)

## 2016-11-08 LAB — CBC WITH DIFFERENTIAL/PLATELET
Basophils Absolute: 0 10*3/uL (ref 0.0–0.1)
Basophils Relative: 0 %
Eosinophils Absolute: 0 10*3/uL (ref 0.0–0.7)
Eosinophils Relative: 0 %
HEMATOCRIT: 32.4 % — AB (ref 36.0–46.0)
HEMOGLOBIN: 9.6 g/dL — AB (ref 12.0–15.0)
LYMPHS ABS: 0.4 10*3/uL — AB (ref 0.7–4.0)
Lymphocytes Relative: 5 %
MCH: 25.9 pg — AB (ref 26.0–34.0)
MCHC: 29.6 g/dL — AB (ref 30.0–36.0)
MCV: 87.3 fL (ref 78.0–100.0)
MONO ABS: 0.5 10*3/uL (ref 0.1–1.0)
MONOS PCT: 5 %
Neutro Abs: 8.1 10*3/uL — ABNORMAL HIGH (ref 1.7–7.7)
Neutrophils Relative %: 90 %
Platelets: 274 10*3/uL (ref 150–400)
RBC: 3.71 MIL/uL — ABNORMAL LOW (ref 3.87–5.11)
RDW: 18.6 % — AB (ref 11.5–15.5)
WBC: 9 10*3/uL (ref 4.0–10.5)

## 2016-11-08 LAB — COMPREHENSIVE METABOLIC PANEL
ALBUMIN: 3 g/dL — AB (ref 3.5–5.0)
ALK PHOS: 78 U/L (ref 38–126)
ALT: 40 U/L (ref 14–54)
ANION GAP: 10 (ref 5–15)
AST: 34 U/L (ref 15–41)
BILIRUBIN TOTAL: 0.7 mg/dL (ref 0.3–1.2)
BUN: 97 mg/dL — ABNORMAL HIGH (ref 6–20)
CALCIUM: 8.3 mg/dL — AB (ref 8.9–10.3)
CO2: 24 mmol/L (ref 22–32)
Chloride: 100 mmol/L — ABNORMAL LOW (ref 101–111)
Creatinine, Ser: 2.2 mg/dL — ABNORMAL HIGH (ref 0.44–1.00)
GFR calc Af Amer: 24 mL/min — ABNORMAL LOW (ref >60)
GFR, EST NON AFRICAN AMERICAN: 20 mL/min — AB (ref >60)
GLUCOSE: 174 mg/dL — AB (ref 65–99)
Potassium: 5.1 mmol/L (ref 3.5–5.1)
Sodium: 134 mmol/L — ABNORMAL LOW (ref 135–145)
TOTAL PROTEIN: 6.8 g/dL (ref 6.5–8.1)

## 2016-11-08 LAB — MAGNESIUM: MAGNESIUM: 2.6 mg/dL — AB (ref 1.7–2.4)

## 2016-11-08 LAB — PHOSPHORUS: Phosphorus: 5.1 mg/dL — ABNORMAL HIGH (ref 2.5–4.6)

## 2016-11-08 MED ORDER — CLOTRIMAZOLE 1 % EX CREA
TOPICAL_CREAM | Freq: Two times a day (BID) | CUTANEOUS | Status: DC
  Administered 2016-11-08: 23:00:00 via TOPICAL
  Filled 2016-11-08 (×3): qty 15

## 2016-11-08 MED ORDER — CAMPHOR-MENTHOL 0.5-0.5 % EX LOTN
TOPICAL_LOTION | CUTANEOUS | Status: DC | PRN
  Administered 2016-11-08: 23:00:00 via TOPICAL
  Filled 2016-11-08: qty 222

## 2016-11-08 NOTE — Progress Notes (Signed)
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 She is only about 1L negative since admission. BUN continues to climb to close to 100 today. She is not on steroids. She refuses any additional therapy until her nephrologist Dr. Justin Mend is called and involved.  Objective   Vitals:   11/07/16 2032 11/08/16 0415 11/08/16 0846 11/08/16 1349  BP:  140/70    Pulse:  (!) 55 (!) 57 (!) 58  Resp:  18 15 15   Temp:  (!) 97.3 F (36.3 C)    TempSrc:  Oral    SpO2: 92% 92% 92% 92%  Weight:  262 lb (118.8 kg)    Height:        Intake/Output Summary (Last 24 hours) at 11/08/16 1518 Last data filed at 11/08/16 1300  Gross per 24 hour  Intake              840 ml  Output              925 ml  Net              -85 ml   Filed Weights   11/07/16 0346 11/07/16 0354 11/08/16 0415  Weight: 264 lb 8.8 oz (120 kg) 264 lb 8.8 oz (120 kg) 262 lb (118.8 kg)    Physical Exam   General appearance: alert, appears older than stated age, no distress and morbidly obese Neck: JVD - 5 cm above sternal notch, no carotid bruit and thyroid not enlarged, symmetric, no tenderness/mass/nodules Lungs: diminished breath sounds bilaterally Heart: irregularly irregular rhythm Abdomen: protuberant, firm Extremities: edema 2+ woody stasis edema and venous stasis dermatitis noted Pulses: 2+ and symmetric Skin: Skin color, texture, turgor normal.  No rashes or lesions Neurologic: Mental status: Alert, oriented, thought content appropriate Psych: Appears irritated  Inpatient Medications    Scheduled Meds: . apixaban  5 mg Oral BID  . aspirin EC  81 mg Oral Daily  . benzonatate  100 mg Oral BID  . bisoprolol  10 mg Oral Daily  . dextromethorphan  15 mg Oral BID  . doxycycline  100 mg Oral Q12H  . guaiFENesin  1,200 mg Oral BID  . hydrALAZINE  25 mg Oral TID  . Influenza vac split quadrivalent PF  0.5 mL Intramuscular Tomorrow-1000  . insulin aspart  0-5 Units Subcutaneous QHS  . insulin aspart  0-9 Units Subcutaneous TID WC  . insulin glargine  12 Units Subcutaneous QHS  . ipratropium  0.5 mg Nebulization TID  . levalbuterol  0.63 mg Nebulization TID  . levothyroxine  25 mcg Oral QAC breakfast  . methylPREDNISolone (SOLU-MEDROL) injection  40 mg Intravenous Q12H  . sodium chloride flush  3 mL Intravenous Q12H    Continuous Infusions: . sodium chloride      PRN Meds: sodium chloride, acetaminophen, HYDROcodone-homatropine, ondansetron (ZOFRAN) IV, sodium chloride flush   Labs   Results for orders placed or performed  during the hospital encounter of 11/04/16 (from the past 48 hour(s))  Glucose, capillary     Status: Abnormal   Collection Time: 11/06/16  4:20 PM  Result Value Ref Range   Glucose-Capillary 268 (H) 65 - 99 mg/dL   Comment 1 Notify RN    Comment 2 Document in Chart   Glucose, capillary     Status: Abnormal   Collection Time: 11/06/16  9:26 PM  Result Value Ref Range   Glucose-Capillary 262 (H) 65 - 99 mg/dL   Comment 1 Notify RN    Comment 2 Document in Chart   CBC with Differential/Platelet     Status: Abnormal   Collection Time: 11/07/16  2:38 AM  Result Value Ref Range   WBC 9.2 4.0 - 10.5 K/uL   RBC 3.83 (L) 3.87 - 5.11 MIL/uL   Hemoglobin 9.9 (L) 12.0 - 15.0 g/dL   HCT 33.5 (L) 36.0 - 46.0 %   MCV 87.5 78.0 - 100.0 fL   MCH 25.8 (L) 26.0 - 34.0 pg   MCHC 29.6 (L) 30.0 - 36.0 g/dL   RDW  18.7 (H) 11.5 - 15.5 %   Platelets 280 150 - 400 K/uL   Neutrophils Relative % 90 %   Neutro Abs 8.3 (H) 1.7 - 7.7 K/uL   Lymphocytes Relative 6 %   Lymphs Abs 0.5 (L) 0.7 - 4.0 K/uL   Monocytes Relative 4 %   Monocytes Absolute 0.4 0.1 - 1.0 K/uL   Eosinophils Relative 0 %   Eosinophils Absolute 0.0 0.0 - 0.7 K/uL   Basophils Relative 0 %   Basophils Absolute 0.0 0.0 - 0.1 K/uL  Comprehensive metabolic panel     Status: Abnormal   Collection Time: 11/07/16  2:38 AM  Result Value Ref Range   Sodium 134 (L) 135 - 145 mmol/L   Potassium 5.3 (H) 3.5 - 5.1 mmol/L   Chloride 100 (L) 101 - 111 mmol/L   CO2 30 22 - 32 mmol/L   Glucose, Bld 187 (H) 65 - 99 mg/dL   BUN 81 (H) 6 - 20 mg/dL   Creatinine, Ser 2.35 (H) 0.44 - 1.00 mg/dL   Calcium 8.5 (L) 8.9 - 10.3 mg/dL   Total Protein 6.9 6.5 - 8.1 g/dL   Albumin 3.0 (L) 3.5 - 5.0 g/dL   AST 27 15 - 41 U/L   ALT 25 14 - 54 U/L   Alkaline Phosphatase 88 38 - 126 U/L   Total Bilirubin 0.7 0.3 - 1.2 mg/dL   GFR calc non Af Amer 19 (L) >60 mL/min   GFR calc Af Amer 22 (L) >60 mL/min    Comment: (NOTE) The eGFR has been calculated using the CKD EPI equation. This calculation has not been validated in all clinical situations. eGFR's persistently <60 mL/min signify possible Chronic Kidney Disease.    Anion gap 4 (L) 5 - 15  Phosphorus     Status: None   Collection Time: 11/07/16  2:38 AM  Result Value Ref Range   Phosphorus 4.6 2.5 - 4.6 mg/dL  Magnesium     Status: Abnormal   Collection Time: 11/07/16  2:38 AM  Result Value Ref Range   Magnesium 2.5 (H) 1.7 - 2.4 mg/dL  Glucose, capillary     Status: Abnormal   Collection Time: 11/07/16  6:23 AM  Result Value Ref Range   Glucose-Capillary 210 (H) 65 - 99 mg/dL  Glucose, capillary     Status: Abnormal   Collection Time: 11/07/16  11:42 AM  Result Value Ref Range   Glucose-Capillary 281 (H) 65 - 99 mg/dL   Comment 1 Notify RN   Glucose, capillary     Status: Abnormal   Collection  Time: 11/07/16  4:24 PM  Result Value Ref Range   Glucose-Capillary 140 (H) 65 - 99 mg/dL   Comment 1 Notify RN    Comment 2 Document in Chart   Glucose, capillary     Status: Abnormal   Collection Time: 11/07/16  9:05 PM  Result Value Ref Range   Glucose-Capillary 210 (H) 65 - 99 mg/dL  CBC with Differential/Platelet     Status: Abnormal   Collection Time: 11/08/16  4:03 AM  Result Value Ref Range   WBC 9.0 4.0 - 10.5 K/uL   RBC 3.71 (L) 3.87 - 5.11 MIL/uL   Hemoglobin 9.6 (L) 12.0 - 15.0 g/dL   HCT 32.4 (L) 36.0 - 46.0 %   MCV 87.3 78.0 - 100.0 fL   MCH 25.9 (L) 26.0 - 34.0 pg   MCHC 29.6 (L) 30.0 - 36.0 g/dL   RDW 18.6 (H) 11.5 - 15.5 %   Platelets 274 150 - 400 K/uL   Neutrophils Relative % 90 %   Neutro Abs 8.1 (H) 1.7 - 7.7 K/uL   Lymphocytes Relative 5 %   Lymphs Abs 0.4 (L) 0.7 - 4.0 K/uL   Monocytes Relative 5 %   Monocytes Absolute 0.5 0.1 - 1.0 K/uL   Eosinophils Relative 0 %   Eosinophils Absolute 0.0 0.0 - 0.7 K/uL   Basophils Relative 0 %   Basophils Absolute 0.0 0.0 - 0.1 K/uL  Comprehensive metabolic panel     Status: Abnormal   Collection Time: 11/08/16  4:03 AM  Result Value Ref Range   Sodium 134 (L) 135 - 145 mmol/L   Potassium 5.1 3.5 - 5.1 mmol/L   Chloride 100 (L) 101 - 111 mmol/L   CO2 24 22 - 32 mmol/L   Glucose, Bld 174 (H) 65 - 99 mg/dL   BUN 97 (H) 6 - 20 mg/dL   Creatinine, Ser 2.20 (H) 0.44 - 1.00 mg/dL   Calcium 8.3 (L) 8.9 - 10.3 mg/dL   Total Protein 6.8 6.5 - 8.1 g/dL   Albumin 3.0 (L) 3.5 - 5.0 g/dL   AST 34 15 - 41 U/L   ALT 40 14 - 54 U/L   Alkaline Phosphatase 78 38 - 126 U/L   Total Bilirubin 0.7 0.3 - 1.2 mg/dL   GFR calc non Af Amer 20 (L) >60 mL/min   GFR calc Af Amer 24 (L) >60 mL/min    Comment: (NOTE) The eGFR has been calculated using the CKD EPI equation. This calculation has not been validated in all clinical situations. eGFR's persistently <60 mL/min signify possible Chronic Kidney Disease.    Anion gap 10 5 - 15   Magnesium     Status: Abnormal   Collection Time: 11/08/16  4:03 AM  Result Value Ref Range   Magnesium 2.6 (H) 1.7 - 2.4 mg/dL  Phosphorus     Status: Abnormal   Collection Time: 11/08/16  4:03 AM  Result Value Ref Range   Phosphorus 5.1 (H) 2.5 - 4.6 mg/dL  Glucose, capillary     Status: Abnormal   Collection Time: 11/08/16  6:07 AM  Result Value Ref Range   Glucose-Capillary 195 (H) 65 - 99 mg/dL  Glucose, capillary     Status: Abnormal   Collection Time: 11/08/16 11:52 AM  Result Value Ref Range   Glucose-Capillary 268 (H) 65 - 99 mg/dL   Comment 1 Notify RN    Comment 2 Document in Chart     ECG   N/A  Telemetry   Atrial flutter with slow VR - Personally Reviewed  Radiology    Dg Abd 1 View  Result Date: 11/07/2016 CLINICAL DATA:  Abdominal distention. EXAM: ABDOMEN - 1 VIEW COMPARISON:  None. FINDINGS: The bowel gas pattern is normal. No radio-opaque calculi or other significant radiographic abnormality are seen. No visible free air or free fluid. No discrete bone abnormality. IMPRESSION: Benign-appearing abdomen. Electronically Signed   By: Lorriane Shire M.D.   On: 11/07/2016 13:56    Cardiac Studies   N/A  Assessment   1. Principal Problem: 2.   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 Active Problems: 4.   Diabetes mellitus without complication ([place]) 5.   Hypertension, uncontrolled 6.   Hypothyroidism, adult 7.   Acute respiratory failure with hypoxemia (HCC) 8.   CKD (chronic kidney disease) stage 4, GFR 15-29 ml/min (HCC) 9.   Chronic anemia 10.   Atrial flutter ([place]) 11.   Plan   1. Predominant right heart failure with Stage 4 CKD - creatinine was worsening on lasix gtts with insufficient output. Remains volume overloaded - cough probably from heart failure. Per her request, would consult nephrology (Dr. Justin Mend is her nephrologist) to ask for assistance with volume management. I think she will require milrinone perhaps in the  short-term for features that seem consistent with cardiorenal syndrome.  Time Spent Directly with Patient:  I have spent a total of 25 minutes with the patient reviewing hospital notes, telemetry, EKGs, labs and examining the patient as well as establishing an assessment and plan that was discussed personally with the patient. > 50% of time was spent in direct patient care.  Length of Stay:  LOS: 3 days   Pixie Casino, MD, [place]  Attending Cardiologist  Direct Dial: [phone]  Fax: [phone]  Website:  [email or web address] Zayda Angell 11/08/2016, 3:18 PM

## 2016-11-08 NOTE — Progress Notes (Signed)
RT came in room to do breathing tx and patient agreed but once RT began to set up tx patient refused and told me to come at a later time.

## 2016-11-08 NOTE — Consult Note (Signed)
Shaunessy Dobratz Admit Date: 11/04/2016 11/08/2016 Arita Miss Requesting Physician:  Marland Mcalpine  Reason for Consult:  Hypervolemia, AoCKD, SOB, Hypoxia HPI:  76 year old female admitted 9/14 for hypoxia, cough, shortness of breath.  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 Hyman Hopes in our office. Last seen July of this year.  History of diastolic heart failure with exacerbation and hyperkalemia requiring inpatient therapy December 2017  Ongoing tobacco user  Likely history of long-standing untreated obstructive sleep apnea, patient has been recommended to have formal evaluation and therapy, not completed  Type 2 diabetes  Hypertension on carvedilol, hydralazine, furosemide 120 mg twice daily  Presumed COPD  Hypothyroidism  Patient was admitted to hospital after presenting with hypoxia and cough. This first identified an urgent care setting. She also was found to have atrial flutter and her renal blocker was changed to bisoprolol. She hasn't placed on Solu-Medrol for potential flare of COPD. Cardial G has been following closely. Given her hypervolemia with significant, 4+, lower extremity edema she was placed on bolus Lasix followed by Lasix drip. Diuresis was minimal and creatinine increased to 2.3 from an admission baseline of 1.95. Diuretics have been held since yesterday. Transthoracic echocardiogram demonstrated normal left ventricular function with not much hypertrophy but the right side had significant evidence of overload, dilatation, and reduced systolic function. She has been thought to have right ventricular heart failure related to untreated OSA, COPD.  She is at best 1 L net negative since admission but weights in fact are higher today than they have been.  She has been adamant that she does not wish to receive further care until having Dr. Hyman Hopes, stress the patient. I have been contacted to come see her. We explored the issues as above. She  doesn't feel that much better, still endorsing PND and orthopnea. Edema still is significant. She is requiring oxygen. She claims of dry mouth and dry nasal passages. She has been strongly encouraged to use BiPAP and has refused.    Creatinine (mg/dL)  Date Value  16/11/9602 1.9 (A)  02/05/2016 1.8 (A)  02/04/2016 1.8 (A)  02/03/2016 1.7 (A)  02/02/2016 1.6 (A)  02/01/2016 1.8 (A)   Creatinine, Ser (mg/dL)  Date Value  54/10/8117 2.20 (H)  11/07/2016 2.35 (H)  11/06/2016 2.11 (H)  11/05/2016 1.93 (H)  11/04/2016 1.95 (H)  02/05/2016 1.84 (H)  02/04/2016 1.75 (H)  02/03/2016 1.71 (H)  02/02/2016 1.63 (H)  02/01/2016 1.81 (H)  ] I/Os: I/O last 3 completed shifts: In: 1420 [P.O.:1130; I.V.:290] Out: 1025 [Urine:1025]   ROS Balance of 12 systems is negative w/ exceptions as above  PMH  Past Medical History:  Diagnosis Date  . CHF (congestive heart failure) (HCC)   . Diabetes mellitus without complication (HCC)   . Hypertension   . Kidney disease   . Renal disorder   . Shingles    PSH History reviewed. No pertinent surgical history. FH  Family History  Problem Relation Age of Onset  . Hypertension Mother   . Hypertension Maternal Grandfather    SH  reports that she has been smoking Cigarettes.  She has been smoking about 1.00 pack per day. She has never used smokeless tobacco. She reports that she does not drink alcohol or use drugs. Allergies  Allergies  Allergen Reactions  . Sulfa Antibiotics Other (See Comments)    unknown  . Coreg [Carvedilol] Rash    Hair loss  . Zinc Rash   Home medications Prior to Admission medications  Medication Sig Start Date End Date Taking? Authorizing Provider  acetaminophen (TYLENOL) 325 MG tablet Take 2 tablets (650 mg total) by mouth every 4 (four) hours as needed for mild pain, moderate pain, fever or headache. 02/05/16  Yes Hongalgi, Maximino Greenland, MD  aspirin EC 81 MG tablet Take 81 mg by mouth daily.   Yes [provider]  carvedilol (COREG) 3.125 MG tablet Take 3.125 mg by mouth 2 (two) times daily. 09/19/16  Yes [provider]  docusate sodium (COLACE) 100 MG capsule Take 100 mg by mouth 2 (two) times daily as needed for constipation.   Yes [provider]  furosemide (LASIX) 80 MG tablet Take 1.5 tablets (120 mg total) by mouth 2 (two) times daily. Patient taking differently: Take 160 mg by mouth 2 (two) times daily.  02/05/16  Yes Hongalgi, Maximino Greenland, MD  glimepiride (AMARYL) 4 MG tablet Take 4 mg by mouth daily. 11/02/16  Yes [provider]  hydrALAZINE (APRESOLINE) 25 MG tablet Take 25 mg by mouth 3 (three) times daily. 11/04/15  Yes [provider]  pioglitazone (ACTOS) 45 MG tablet Take 45 mg by mouth daily. 09/08/16  Yes [provider]  VENTOLIN HFA 108 (90 Base) MCG/ACT inhaler Inhale 2 puffs into the lungs daily. 11/03/16  Yes [provider]    Current Medications Scheduled Meds: . apixaban  5 mg Oral BID  . aspirin EC  81 mg Oral Daily  . benzonatate  100 mg Oral BID  . bisoprolol  10 mg Oral Daily  . dextromethorphan  15 mg Oral BID  . doxycycline  100 mg Oral Q12H  . guaiFENesin  1,200 mg Oral BID  . hydrALAZINE  25 mg Oral TID  . Influenza vac split quadrivalent PF  0.5 mL Intramuscular Tomorrow-1000  . insulin aspart  0-5 Units Subcutaneous QHS  . insulin aspart  0-9 Units Subcutaneous TID WC  . insulin glargine  12 Units Subcutaneous QHS  . ipratropium  0.5 mg Nebulization TID  . levalbuterol  0.63 mg Nebulization TID  . levothyroxine  25 mcg Oral QAC breakfast  . methylPREDNISolone (SOLU-MEDROL) injection  40 mg Intravenous Q12H  . sodium chloride flush  3 mL Intravenous Q12H   Continuous Infusions: . sodium chloride     PRN Meds:.sodium chloride, acetaminophen, HYDROcodone-homatropine, ondansetron (ZOFRAN) IV, sodium chloride flush  CBC  Recent Labs Lab 11/06/16 0340 11/07/16 0238 11/08/16 0403  WBC 6.3 9.2  9.0  NEUTROABS 5.6 8.3* 8.1*  HGB 10.7* 9.9* 9.6*  HCT 36.6 33.5* 32.4*  MCV 89.3 87.5 87.3  PLT 281 280 274   Basic Metabolic Panel  Recent Labs Lab 11/04/16 1315 11/05/16 0216 11/06/16 0340 11/07/16 0238 11/08/16 0403  NA 138 139 136 134* 134*  K 3.4* 3.6 4.8 5.3* 5.1  CL 100* 100* 100* 100* 100*  CO2 GLUCOSE 74 108* 121* 187* 174*  BUN 45* 45* 56* 81* 97*  CREATININE 1.95* 1.93* 2.11* 2.35* 2.20*  CALCIUM 8.5* 8.4* 8.5* 8.5* 8.3*  PHOS  --   --  4.9* 4.6 5.1*    Physical Exam  Blood pressure 140/70, pulse (!) 58, temperature (!) 97.3 F (36.3 C), temperature source Oral, resp. rate 15, height 5' (1.524 m), weight 118.8 kg (262 lb), SpO2 92 %. GEN: Chronically ill-appearing, sitting in chair, using nasal cannula  ENT: NCAT  EYES: EOMI  CV: Regular, no murmurs  PULM: Diminished breath sounds in the bases especially but throughout,  ABD: Soft, nontender  SKIN: No rashes or lesions  EXT:4+ edema in both legs into the knees  Assessment 40F with AoCKD3 and Right sided sCHF exacerbation with dyspnea, cough, hypervolemia, likely exacerbated by COPD + untreated OSA  1. AoCKD3, related to decompensated R sided sCHF + diuretics 2. R sided CHF exacerbation 3. Presumed OSA, untreated 4. 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 Ongoing tobacco user 6. Hypervolemia 7. DM2 8. Hypothyroidism 9. HTN 10. AFlutter on NOAC 11. Anemia  Plan 1. Hold iduretics overnight 2. Strongly recommended use of nocturnal BiPAP or CPAP 3. Will discuss with Cardiology in AM 4. Will need another attempt at diuretics +/- inotropes 5. Daily weights, Daily Renal Panel, Strict I/Os, Avoid nephrotoxins (NSAIDs, judicious IV Contrast)    Sabra Heck MD [phone] pgr 11/08/2016, 4:53 PM

## 2016-11-08 NOTE — Progress Notes (Signed)
PROGRESS NOTE    Sue Roach  ZOX:[id number] DOB: 17-Feb-1941 DOA: 11/04/2016 PCP: Kaleen Mask, MD   Brief Narrative:  Sue Roach is a 76 y.o. 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 Patient reports that 3 days ago she developed a scratchy, sore throat with a headache and persistent coughing of clear mucoid sputum. No fevers no chills. No significant change in chronic lower extremity edema. She did use a prescribed cream on her left lower extremity yesterday but her skin began to burn and she removed the cream and noticed a residual redness/erythema after removal of the cream. She went to her PCP office today for she was found to be hypoxemic. She does not wear chronic oxygen. Upon presentation to the ER on room air her saturations were 60%. Chest x-ray revealed early edema without effusion and cardiomegaly.   She was also found to be in Atrial Flutter. Patient was admitted to SDU and Cardiology was consulted. Patient was placed on Anticoagulation and also placed on a Lasix gtt. She had severe wheezing but was refusing to go on BiPAP. She decompensated and was very Lethargic and found to be Hypercapnic with CO2 Narcosis so a Rapid was called but I was never notified by Nursing on 11/05/16. Dr. Royann Shivers called me to tell me what happened as he was in the Room when events took place and he consulted Dr. Isaiah Serge of PCCM.   PCCM evaluated and convinced patient to wear BiPAP and patient wore BiPAP intermittently until Saturations started dropping so then she agreed to go back on BiPAP. Patient ultimately refused BiPAP again. Cardiology evaluated and D/C'd Lasix gtt as Cr was rising; Cardiology worried about Cardiorenal but ECHO was normal. They may consider Iontropic Assistance for Diuresis. Nephrology  was consulted for further evaluation and recommendations given request for assistance in volume management as it appears consistent with Cardio-Renal.   Assessment & Plan:   Principal Problem:   Acute on chronic right-sided congestive heart failure (HCC) Active Problems:   Diabetes mellitus without complication (HCC)   Hypertension, uncontrolled   Hypothyroidism, adult   Acute respiratory failure with hypoxemia (HCC)   CKD (chronic kidney disease) stage 4, GFR 15-29 ml/min (HCC)   Chronic anemia   Atrial flutter (HCC)  Acute respiratory failure with Hypercapnea and hypoxemia 2/2 Acute on Right congestive heart failure/Pulmonary HTN, OSA, and Rhinovirus -Presented with 3 days of cough headache and shortness of breath with x-ray findings of edema with mild elevation in BNP and troponin concerning acute heart failure exacerbation -On Lasix gtt and Cardiology managing and now D/C'd due to worsening Cr  -Cardiology Consulted and appreciated Eval -Continue Carvedilol during acute exacerbation -No ACE I 2/2 CKD 4 -Continue supportive care with oxygen; Refusing BiPAP now  -Daily weights, strict I/O; Patient is -1.238 Liters and +3lbs -Last echocardiogram completed December 2017: Preserved LV function, moderate LVH, moderate decreased right systolic function, mild pulmonary hypertension 36 mmHg, mild tricuspid regurgitation -  -Repeat ECHO showed EF of 55-60% with RV Volume and Pressure Overload  -Suspect mild elevation in troponin secondary to acute heart failure exacerbation-cardiology consulted by EDP-continue to cycle troponin -Added IV Steroids, Xopenex/Atrovent, Flutter Valve, Incentive Spirometer, Mucinex, and Added Doxycycline (Patient wants Penicillins); IV Steroids decreased -Fluid Restricted the patient to 1200 mL -PCCM evaluated and appreciated assistance -C/w BiPAP but patient refusing  -Cardiology suggesting  that patient may need Iontropic Assistance for Diuresis;  -Consulted  Nephrology and they will discuss with Cardiology in AM in another attempt at Diuretics and possible Ionotropes  Atrial flutter/RBBB -New onset and rate controlled -Likely related to underlying pulmonary issues/right atrial dilatation and acute hypoxia -Cardiology consultation appreciated -CHASDVASc=6 -Patient started on NOAC with Apixaban but patient does not want to continue and wants to switch to Coumadin given prohibitive Cost; Will discuss with Cardiology and if that is the case will need Heparin gtt for bridging or Lovenox Injections but per Cardiology would prefer to avoid at this point  -Rate is controlled on BB  Diabetes mellitus without complication  -Discontinued Actos in setting of heart failure -hold Amaryl -Novolog SSI AC/HS and will add Lantus 12 units per Diabetic Education Recommendations  -HgbA1c 6.7  -CBG's ranging from 210-281  Hypertension, uncontrolled -Likely secondary to acute heart exacerbation -Continue hydralazine -Likely can resume carvedilol once proves hemodynamically stable  -Cardiology consulted and appreciate evaluation  CKD (chronic kidney disease) stage 4, GFR 15-29 ml/min -Baseline Cr is 1.6 -1.8 -BUN/Cr went from 45/1.93 -> 56/2.11 -> 81/2.35 ->  97/2.20 so Cardiology stopped Lasix gtt -Continue to Monitor and ?Cardiorenal -Nephrology consulted and recommending holding Diuretics overnight and will need another attempt at Diuretics +/- Inotropes  COPD -Has wheezing and combination of CHF; Wheezing has improved -Continue preadmission Ventolin inhaler -Given paroxysmal coughing may have viral syndrome so checked respiratory viral panel and was +for Rhinovirus -Patient reports has never had coughing episode like this before and has had several breathing treatments over the past 2 days which have not improved her symptoms -As Above, Added IV Steroids (now weaning), Xopenex/Atrovent, Flutter Valve, Incentive Spirometer, Mucinex, and Added  Doxycycline -Refusing BiPAP still   Hypothyroidism, adult -Continue Synthroid  Chronic anemia -Hemoglobin stable and at baseline -Hb/Hct went from 10.1/32.8 -> 10.7/36.6 -> 9.9/33.5 -> 9.6/32.4 -Continue to Monitor and Repeat CBC in AM   L leg redness/erythema:   -Sounds like reaction to cream she used from her PCP.  Would continue to monitor.  -Improving   Prolonged QT:  Repeat EKG in AM  Hyperkalemia -Mild at 5.3 and improved to 5.1 -Hold Potassium Supplementation and Recheck in AM -Continue to Monitor and Repeat CMP in AM -Nephrology consulted for further Recc's  Abdominal Firmness, improved -Patient complained that her abdomen was firmer -Abdominal Flat Plate obtained and showed The bowel gas pattern is normal. No radio-opaque calculi or other significant radiographic abnormality are seen. No visible free air or free fluid. No discrete bone abnormality.  DVT prophylaxis: Anticoagulated with Apixaban Code Status: DO NOT RESUSCITATE Family Communication: No family present at bedside Disposition Plan: Remain in SDU  Consultants:   Cardiology  PCCM  Nephrology    Procedures:  ECHOCARDIOGRAM Study Conclusions  - Left ventricle: The cavity size was mildly dilated. Wall   thickness was normal. Systolic function was normal. The estimated   ejection fraction was in the range of 55% to 60%. Wall motion was   normal; there were no regional wall motion abnormalities. - Ventricular septum: The contour showed diastolic flattening and   systolic flattening. These changes are consistent with RV volume   and pressure overload. - Mitral valve: There was mild regurgitation. Valve area by   continuity equation (using LVOT flow): 2.43 cm^2. - Left atrium: The atrium was severely dilated. - Right ventricle: The cavity size was moderately dilated. Systolic   function was moderately reduced. - Right atrium: The atrium was severely dilated. -  Tricuspid valve: There was  malcoaptation of the valve leaflets.   There was moderate-severe regurgitation directed centrally.   Diastolic regurgitation was present. - Pulmonary arteries: Systolic pressure was moderately increased.   PA peak pressure: 58 mm Hg (S).    Antimicrobials:  Anti-infectives    Start     Dose/Rate Route Frequency Ordered Stop   11/05/16 1700  doxycycline (VIBRA-TABS) tablet 100 mg     100 mg Oral Every 12 hours 11/05/16 1502       Subjective: Seen this AM and was upset that she wanted her Abx changed to PCN. States cough was bothering her but she was breathing better. Refused to wear BiPAP. Did not want to participate further in care unless she spoke to Nephrology. No other concerns or complaints.   Objective: Vitals:   11/07/16 2032 11/08/16 0415 11/08/16 0846 11/08/16 1349  BP:  140/70    Pulse:  (!) 55 (!) 57 (!) 58  Resp:  Temp:  (!) 97.3 F (36.3 C)    TempSrc:  Oral    SpO2: 92% 92% 92% 92%  Weight:  118.8 kg (262 lb)    Height:        Intake/Output Summary (Last 24 hours) at 11/08/16 1937 Last data filed at 11/08/16 1848  Gross per 24 hour  Intake             1080 ml  Output             1450 ml  Net             -370 ml   Filed Weights   11/07/16 0346 11/07/16 0354 11/08/16 0415  Weight: 120 kg (264 lb 8.8 oz) 120 kg (264 lb 8.8 oz) 118.8 kg (262 lb)   Examination: Physical Exam:  Constitutional: WN/WD morbidly obese Caucasian female in Chair agitated Eyes: Sclerae anicteric. Conjunctivae non-injected ENMT: Grossly normal hearing. MMM Neck: Supple with some JVD Respiratory:  Diminished but no appreciable wheezing/rales/rhonchi. Patient was not tachypenic but was wearing supplemental O2 via [place] Cardiovascular: Irregularly Irregular controlled rate. 2+ LE edema Abdomen: Soft, NT, Distended due to Body habitus. Bowel sounds present GU: Deferred Musculoskeletal: No contractures; No cyanosis Skin: Warm and Dry. Leg erythema improved but still had leg  swelling Neurologic: CN 2-12 grossly intact. No appreciable focal deficits Psychiatric: Agitated and Frustrated Mood. Intact judgement and insight. Awake and alert and oriented  Data Reviewed: I have personally reviewed following labs and imaging studies  CBC:  Recent Labs Lab 11/04/16 1315 11/06/16 0340 11/07/16 0238 11/08/16 0403  WBC 7.1 6.3 9.2 9.0  NEUTROABS 5.0 5.6 8.3* 8.1*  HGB 10.1* 10.7* 9.9* 9.6*  HCT 32.8* 36.6 33.5* 32.4*  MCV 86.8 89.3 87.5 87.3  PLT 240 281 280 274   Basic Metabolic Panel:  Recent Labs Lab 11/04/16 1315 11/05/16 0216 11/06/16 0340 11/07/16 0238 11/08/16 0403  NA 138 139 136 134* 134*  K 3.4* 3.6 4.8 5.3* 5.1  CL 100* 100* 100* 100* 100*  CO2 GLUCOSE 74 108* 121* 187* 174*  BUN 45* 45* 56* 81* 97*  CREATININE 1.95* 1.93* 2.11* 2.35* 2.20*  CALCIUM 8.5* 8.4* 8.5* 8.5* 8.3*  MG  --  2.5* 2.6* 2.5* 2.6*  PHOS  --   --  4.9* 4.6 5.1*   GFR: Estimated Creatinine Clearance: 25.3 mL/min (A) (by C-G formula based on SCr of 2.2 mg/dL (H)). Liver Function Tests:  Recent Labs Lab  11/06/16 0340 11/07/16 0238 11/08/16 0403  AST 26 27 34  ALT 21 25 40  ALKPHOS 96 88 78  BILITOT 1.0 0.7 0.7  PROT 7.6 6.9 6.8  ALBUMIN 3.2* 3.0* 3.0*   No results for input(s): LIPASE, AMYLASE in the last 168 hours. No results for input(s): AMMONIA in the last 168 hours. Coagulation Profile: No results for input(s): INR, PROTIME in the last 168 hours. Cardiac Enzymes:  Recent Labs Lab 11/04/16 1412 11/04/16 2042 11/05/16 0216 11/05/16 1009  TROPONINI 0.06* 0.06* 0.06* 0.05*   BNP (last 3 results) No results for input(s): PROBNP in the last 8760 hours. HbA1C: No results for input(s): HGBA1C in the last 72 hours. CBG:  Recent Labs Lab 11/07/16 1624 11/07/16 2105 11/08/16 0607 11/08/16 1152 11/08/16 1634  GLUCAP 140* 210* 195* 268* 216*   Lipid Profile: No results for input(s): CHOL, HDL, LDLCALC, TRIG, CHOLHDL, LDLDIRECT  in the last 72 hours. Thyroid Function Tests: No results for input(s): TSH, T4TOTAL, FREET4, T3FREE, THYROIDAB in the last 72 hours. Anemia Panel: No results for input(s): VITAMINB12, FOLATE, FERRITIN, TIBC, IRON, RETICCTPCT in the last 72 hours. Sepsis Labs: No results for input(s): PROCALCITON, LATICACIDVEN in the last 168 hours.  Recent Results (from the past 240 hour(s))  MRSA PCR Screening     Status: None   Collection Time: 11/04/16 10:09 PM  Result Value Ref Range Status   MRSA by PCR NEGATIVE NEGATIVE Final    Comment:        The GeneXpert MRSA Assay (FDA approved for NASAL specimens only), is one component of a comprehensive MRSA colonization surveillance program. It is not intended to diagnose MRSA infection nor to guide or monitor treatment for MRSA infections.   Respiratory Panel by PCR     Status: Abnormal   Collection Time: 11/05/16  3:50 AM  Result Value Ref Range Status   Adenovirus NOT DETECTED NOT DETECTED Final   Coronavirus 229E NOT DETECTED NOT DETECTED Final   Coronavirus HKU1 NOT DETECTED NOT DETECTED Final   Coronavirus NL63 NOT DETECTED NOT DETECTED Final   Coronavirus OC43 NOT DETECTED NOT DETECTED Final   Metapneumovirus NOT DETECTED NOT DETECTED Final   Rhinovirus / Enterovirus DETECTED (A) NOT DETECTED Final   Influenza A NOT DETECTED NOT DETECTED Final   Influenza A H1 NOT DETECTED NOT DETECTED Final   Influenza A H1 2009 NOT DETECTED NOT DETECTED Final   Influenza A H3 NOT DETECTED NOT DETECTED Final   Influenza B NOT DETECTED NOT DETECTED Final   Parainfluenza Virus 1 NOT DETECTED NOT DETECTED Final   Parainfluenza Virus 2 NOT DETECTED NOT DETECTED Final   Parainfluenza Virus 3 NOT DETECTED NOT DETECTED Final   Parainfluenza Virus 4 NOT DETECTED NOT DETECTED Final   Respiratory Syncytial Virus NOT DETECTED NOT DETECTED Final   Bordetella pertussis NOT DETECTED NOT DETECTED Final   Chlamydophila pneumoniae NOT DETECTED NOT DETECTED Final    Mycoplasma pneumoniae NOT DETECTED NOT DETECTED Final    Radiology Studies: Dg Abd 1 View  Result Date: 11/07/2016 CLINICAL DATA:  Abdominal distention. EXAM: ABDOMEN - 1 VIEW COMPARISON:  None. FINDINGS: The bowel gas pattern is normal. No radio-opaque calculi or other significant radiographic abnormality are seen. No visible free air or free fluid. No discrete bone abnormality. IMPRESSION: Benign-appearing abdomen. Electronically Signed   By: Francene Boyers M.D.   On: 11/07/2016 13:56   Scheduled Meds: . apixaban  5 mg Oral BID  . aspirin EC  81 mg Oral  Daily  . benzonatate  100 mg Oral BID  . bisoprolol  10 mg Oral Daily  . dextromethorphan  15 mg Oral BID  . doxycycline  100 mg Oral Q12H  . guaiFENesin  1,200 mg Oral BID  . hydrALAZINE  25 mg Oral TID  . Influenza vac split quadrivalent PF  0.5 mL Intramuscular Tomorrow-1000  . insulin aspart  0-5 Units Subcutaneous QHS  . insulin aspart  0-9 Units Subcutaneous TID WC  . insulin glargine  12 Units Subcutaneous QHS  . ipratropium  0.5 mg Nebulization TID  . levalbuterol  0.63 mg Nebulization TID  . levothyroxine  25 mcg Oral QAC breakfast  . methylPREDNISolone (SOLU-MEDROL) injection  40 mg Intravenous Q12H  . sodium chloride flush  3 mL Intravenous Q12H   Continuous Infusions: . sodium chloride      LOS: 3 days   Merlene Laughter, DO [facility] Pager [phone]  If 7PM-7AM, please contact night-coverage [email or web address] Password [facility] 11/08/2016, 7:37 PM

## 2016-11-09 LAB — BASIC METABOLIC PANEL
Anion gap: 9 (ref 5–15)
BUN: 99 mg/dL — AB (ref 6–20)
CALCIUM: 8.3 mg/dL — AB (ref 8.9–10.3)
CO2: 28 mmol/L (ref 22–32)
CREATININE: 2.01 mg/dL — AB (ref 0.44–1.00)
Chloride: 99 mmol/L — ABNORMAL LOW (ref 101–111)
GFR calc Af Amer: 26 mL/min — ABNORMAL LOW (ref >60)
GFR calc non Af Amer: 23 mL/min — ABNORMAL LOW (ref >60)
Glucose, Bld: 212 mg/dL — ABNORMAL HIGH (ref 65–99)
Potassium: 4.9 mmol/L (ref 3.5–5.1)
SODIUM: 136 mmol/L (ref 135–145)

## 2016-11-09 LAB — GLUCOSE, CAPILLARY
GLUCOSE-CAPILLARY: 277 mg/dL — AB (ref 65–99)
GLUCOSE-CAPILLARY: 142 mg/dL — AB (ref 65–99)
Glucose-Capillary: 194 mg/dL — ABNORMAL HIGH (ref 65–99)

## 2016-11-09 LAB — COMPREHENSIVE METABOLIC PANEL
ALK PHOS: 77 U/L (ref 38–126)
ALT: 44 U/L (ref 14–54)
ANION GAP: 6 (ref 5–15)
AST: 27 U/L (ref 15–41)
Albumin: 3.1 g/dL — ABNORMAL LOW (ref 3.5–5.0)
BILIRUBIN TOTAL: 0.9 mg/dL (ref 0.3–1.2)
BUN: 100 mg/dL — ABNORMAL HIGH (ref 6–20)
CALCIUM: 8.5 mg/dL — AB (ref 8.9–10.3)
CO2: 28 mmol/L (ref 22–32)
Chloride: 102 mmol/L (ref 101–111)
Creatinine, Ser: 1.93 mg/dL — ABNORMAL HIGH (ref 0.44–1.00)
GFR calc non Af Amer: 24 mL/min — ABNORMAL LOW (ref >60)
GFR, EST AFRICAN AMERICAN: 28 mL/min — AB (ref >60)
Glucose, Bld: 136 mg/dL — ABNORMAL HIGH (ref 65–99)
POTASSIUM: 5.1 mmol/L (ref 3.5–5.1)
Sodium: 136 mmol/L (ref 135–145)
TOTAL PROTEIN: 6.6 g/dL (ref 6.5–8.1)

## 2016-11-09 LAB — CBC WITH DIFFERENTIAL/PLATELET
BASOS PCT: 0 %
Basophils Absolute: 0 10*3/uL (ref 0.0–0.1)
EOS ABS: 0 10*3/uL (ref 0.0–0.7)
Eosinophils Relative: 0 %
HEMATOCRIT: 33.2 % — AB (ref 36.0–46.0)
HEMOGLOBIN: 9.8 g/dL — AB (ref 12.0–15.0)
Lymphocytes Relative: 7 %
Lymphs Abs: 0.5 10*3/uL — ABNORMAL LOW (ref 0.7–4.0)
MCH: 25.9 pg — ABNORMAL LOW (ref 26.0–34.0)
MCHC: 29.5 g/dL — AB (ref 30.0–36.0)
MCV: 87.6 fL (ref 78.0–100.0)
MONO ABS: 0.4 10*3/uL (ref 0.1–1.0)
Monocytes Relative: 5 %
NEUTROS ABS: 7 10*3/uL (ref 1.7–7.7)
NEUTROS PCT: 88 %
Platelets: 276 10*3/uL (ref 150–400)
RBC: 3.79 MIL/uL — ABNORMAL LOW (ref 3.87–5.11)
RDW: 18.5 % — AB (ref 11.5–15.5)
WBC: 7.9 10*3/uL (ref 4.0–10.5)

## 2016-11-09 LAB — MAGNESIUM: MAGNESIUM: 2.7 mg/dL — AB (ref 1.7–2.4)

## 2016-11-09 LAB — PHOSPHORUS: PHOSPHORUS: 4 mg/dL (ref 2.5–4.6)

## 2016-11-09 MED ORDER — FUROSEMIDE 10 MG/ML IJ SOLN
120.0000 mg | Freq: Three times a day (TID) | INTRAMUSCULAR | Status: DC
Start: 2016-11-09 — End: 2016-11-11
  Administered 2016-11-09 – 2016-11-11 (×7): 120 mg via INTRAVENOUS
  Filled 2016-11-09 (×4): qty 10
  Filled 2016-11-09: qty 12
  Filled 2016-11-09 (×2): qty 10
  Filled 2016-11-09: qty 12

## 2016-11-09 MED ORDER — WARFARIN SODIUM 5 MG PO TABS
5.0000 mg | ORAL_TABLET | Freq: Once | ORAL | Status: AC
  Administered 2016-11-09: 5 mg via ORAL
  Filled 2016-11-09: qty 1

## 2016-11-09 MED ORDER — WARFARIN - PHARMACIST DOSING INPATIENT
Freq: Every day | Status: DC
  Administered 2016-11-10: 18:00:00
  Administered 2016-11-11: 1
  Administered 2016-11-12 – 2016-11-15 (×4)

## 2016-11-09 MED ORDER — ENOXAPARIN SODIUM 120 MG/0.8ML ~~LOC~~ SOLN
120.0000 mg | SUBCUTANEOUS | Status: DC
  Administered 2016-11-09 – 2016-11-15 (×6): 120 mg via SUBCUTANEOUS
  Filled 2016-11-09 (×7): qty 0.8

## 2016-11-09 MED ORDER — IPRATROPIUM BROMIDE 0.02 % IN SOLN
0.5000 mg | RESPIRATORY_TRACT | Status: DC | PRN
  Administered 2016-11-10: 0.5 mg via RESPIRATORY_TRACT
  Filled 2016-11-09: qty 2.5

## 2016-11-09 MED ORDER — LEVALBUTEROL HCL 0.63 MG/3ML IN NEBU
0.6300 mg | INHALATION_SOLUTION | RESPIRATORY_TRACT | Status: DC | PRN
  Administered 2016-11-10: 0.63 mg via RESPIRATORY_TRACT
  Filled 2016-11-09: qty 3

## 2016-11-09 NOTE — Progress Notes (Signed)
D/w Dr. Marisue Humble (nephrology) this am. Creatinine improved off diuretics, but remains volume overloaded. He felt that we should rechallenge her with TID lasix. I think that is reasonable - if creatinine worsens, suggestive of a possible cardiorenal syndrome, then options are limited. May benefit from short-term inotropes, but this is not a long-term solution. Would recommend compliance with BIPAP, which she is refusing.  Chrystie Nose, MD, [facility]  [place]  [facility]  Attending Cardiologist  Direct Dial: [phone]  Fax: [phone]  Website:  [email or web address]

## 2016-11-09 NOTE — Care Management Important Message (Signed)
Important Message  Patient Details  Name: Sue Roach MRN: [id number] Date of Birth: [birth year]   Medicare Important Message Given:  Yes    Kyla Balzarine 11/09/2016, 9:55 AM

## 2016-11-09 NOTE — Progress Notes (Signed)
PROGRESS NOTE    Sue Roach  ZOX:[id number] DOB: [birth year] DOA: 11/04/2016 PCP: Kaleen Mask, MD   Brief Narrative: 76 y.o.femalewith medical history significant for obesity, chronic right-sided heart failure on diuretics with pulmonary HTN, DM 2on oral agents, chronic lower extremity stasis dermatitis, CKD 4,chronic anemia and hypertension. Patient reports that 3 days ago she developed a scratchy, sore throat with a headache and persistent coughing of clear mucoid sputum. No fevers no chills. No significant change in chronic lower extremity edema. She did use a prescribed cream on her left lower extremity yesterday but her skin began to burn and she removed the cream and noticed a residual redness/erythema after removal of the cream. She went to her PCP office today for she was found to be hypoxemic. She does not wear chronic oxygen. Upon presentation to the ER on room air her saturations were 60%. Chest x-ray revealed early edema without effusion and cardiomegaly.   She was also found to be in Atrial Flutter. Patient was admitted to SDU and Cardiology was consulted. Patient was placed on Anticoagulation and also placed on a Lasix gtt. She had severe wheezing but was refusing to go on BiPAP. She decompensated and was very Lethargic and found to be Hypercapnic with CO2 Narcosis so a Rapid was called but I was never notified by Nursing on 11/05/16. Dr. Royann Shivers called me to tell me what happened as he was in the Room when events took place and he consulted Dr. Isaiah Serge of PCCM.   PCCM evaluated and convinced patient to wear BiPAP and patient wore BiPAP intermittently until Saturations started dropping so then she agreed to go back on BiPAP. Patient ultimately refused BiPAP again. Cardiology evaluated and D/C'd Lasix gtt as Cr was rising; Cardiology worried about Cardiorenal but ECHO was normal. They may consider Iontropic Assistance for Diuresis. Nephrology was consulted for further  evaluation and recommendations given request for assistance in volume management as it appears consistent with Cardio-Renal. Patient reports that se did not use BIPAP Again last nite as she says people kept waking her up.       Assessment & Plan:   Principal Problem:   Acute on chronic right-sided congestive heart failure (HCC) Active Problems:   Diabetes mellitus without complication (HCC)   Hypertension, uncontrolled   Hypothyroidism, adult   Acute respiratory failure with hypoxemia (HCC)   CKD (chronic kidney disease) stage 4, GFR 15-29 ml/min (HCC)   Chronic anemia   Atrial flutter (HCC)  Acute hypercapneic hypoxic respiratory failure/OSA//Pulmonary htn-she has been started on Lasix 120 mg IV 3 times a day. Encourage patient to use BiPAP at night. Follow-up BMP twice a day.  Type 2 diabetes continue Lantus 12 units at bedtime.  Hypothyroidism continue Synthroid 25 MCG every morning.  Hypertension stable continue hydralazine.  New onset atrial flutter rate controlled. Patient is currently on apixaban. But patient reports that she cannot afford to buy apixaban as an outpatient. Patient will be started on Lovenox 1 mg/kg which equals 220 mg daily along with Coumadin. Pharmacy to dose Coumadin so she can be discharged on Coumadin which is cheaper. Will DC Eliquis 12 hours after Lovenox has been started.     DVT prophylaxis:  Code Status:  Family Communication:  Disposition Plan:    Consultants  Procedures:   Antimicrobials:    Subjective:   Objective: Vitals:   11/08/16 2107 11/09/16 0417 11/09/16 0418 11/09/16 1040  BP:  137/71 137/71 (!) 149/56  Pulse:  (!) 57  Marland Kitchen)  58  Resp:  19  15  Temp:  (!) 97.4 F (36.3 C)    TempSrc:  Oral    SpO2: 98% 100%  100%  Weight:  119.4 kg (263 lb 4.8 oz)    Height:        Intake/Output Summary (Last 24 hours) at 11/09/16 1648 Last data filed at 11/09/16 1600  Gross per 24 hour  Intake              662 ml  Output               950 ml  Net             -288 ml   Filed Weights   11/07/16 0354 11/08/16 0415 11/09/16 0417  Weight: 120 kg (264 lb 8.8 oz) 118.8 kg (262 lb) 119.4 kg (263 lb 4.8 oz)    Examination:  General exam: Appears calm and comfortable  Respiratory system: Clear to auscultation. Respiratory effort normal. Cardiovascular system: S1 & S2 heard, RRR. No JVD, murmurs, rubs, gallops or clicks. No pedal edema. Gastrointestinal system: Abdomen is nondistended, soft and nontender. No organomegaly or masses felt. Normal bowel sounds heard. Central nervous system: Alert and oriented. No focal neurological deficits. Extremities: Symmetric 5 x 5 power. Skin: No rashes, lesions or ulcers Psychiatry: Judgement and insight appear normal. Mood & affect appropriate.     Data Reviewed: I have personally reviewed following labs and imaging studies  CBC:  Recent Labs Lab 11/04/16 1315 11/06/16 0340 11/07/16 0238 11/08/16 0403 11/09/16 0231  WBC 7.1 6.3 9.2 9.0 7.9  NEUTROABS 5.0 5.6 8.3* 8.1* 7.0  HGB 10.1* 10.7* 9.9* 9.6* 9.8*  HCT 32.8* 36.6 33.5* 32.4* 33.2*  MCV 86.8 89.3 87.5 87.3 87.6  PLT 240 281 280 274 276   Basic Metabolic Panel:  Recent Labs Lab 11/05/16 0216 11/06/16 0340 11/07/16 0238 11/08/16 0403 11/09/16 0231  NA 139 136 134* 134* 136  K 3.6 4.8 5.3* 5.1 5.1  CL 100* 100* 100* 100* 102  CO2 GLUCOSE 108* 121* 187* 174* 136*  BUN 45* 56* 81* 97* 100*  CREATININE 1.93* 2.11* 2.35* 2.20* 1.93*  CALCIUM 8.4* 8.5* 8.5* 8.3* 8.5*  MG 2.5* 2.6* 2.5* 2.6* 2.7*  PHOS  --  4.9* 4.6 5.1* 4.0   GFR: Estimated Creatinine Clearance: 28.9 mL/min (A) (by C-G formula based on SCr of 1.93 mg/dL (H)). Liver Function Tests:  Recent Labs Lab 11/06/16 0340 11/07/16 0238 11/08/16 0403 11/09/16 0231  AST 26 27 34 27  ALT 21 25 40 44  ALKPHOS 96 88 78 77  BILITOT 1.0 0.7 0.7 0.9  PROT 7.6 6.9 6.8 6.6  ALBUMIN 3.2* 3.0* 3.0* 3.1*   No results for input(s):  LIPASE, AMYLASE in the last 168 hours. No results for input(s): AMMONIA in the last 168 hours. Coagulation Profile: No results for input(s): INR, PROTIME in the last 168 hours. Cardiac Enzymes:  Recent Labs Lab 11/04/16 1412 11/04/16 2042 11/05/16 0216 11/05/16 1009  TROPONINI 0.06* 0.06* 0.06* 0.05*   BNP (last 3 results) No results for input(s): PROBNP in the last 8760 hours. HbA1C: No results for input(s): HGBA1C in the last 72 hours. CBG:  Recent Labs Lab 11/08/16 0607 11/08/16 1152 11/08/16 1634 11/08/16 2125 11/09/16 1128  GLUCAP 195* 268* 216* 208* 277*   Lipid Profile: No results for input(s): CHOL, HDL, LDLCALC, TRIG, CHOLHDL, LDLDIRECT in the last 72 hours. Thyroid Function Tests: No results for  input(s): TSH, T4TOTAL, FREET4, T3FREE, THYROIDAB in the last 72 hours. Anemia Panel: No results for input(s): VITAMINB12, FOLATE, FERRITIN, TIBC, IRON, RETICCTPCT in the last 72 hours. Sepsis Labs: No results for input(s): PROCALCITON, LATICACIDVEN in the last 168 hours.  Recent Results (from the past 240 hour(s))  MRSA PCR Screening     Status: None   Collection Time: 11/04/16 10:09 PM  Result Value Ref Range Status   MRSA by PCR NEGATIVE NEGATIVE Final    Comment:        The GeneXpert MRSA Assay (FDA approved for NASAL specimens only), is one component of a comprehensive MRSA colonization surveillance program. It is not intended to diagnose MRSA infection nor to guide or monitor treatment for MRSA infections.   Respiratory Panel by PCR     Status: Abnormal   Collection Time: 11/05/16  3:50 AM  Result Value Ref Range Status   Adenovirus NOT DETECTED NOT DETECTED Final   Coronavirus 229E NOT DETECTED NOT DETECTED Final   Coronavirus HKU1 NOT DETECTED NOT DETECTED Final   Coronavirus NL63 NOT DETECTED NOT DETECTED Final   Coronavirus OC43 NOT DETECTED NOT DETECTED Final   Metapneumovirus NOT DETECTED NOT DETECTED Final   Rhinovirus / Enterovirus  DETECTED (A) NOT DETECTED Final   Influenza A NOT DETECTED NOT DETECTED Final   Influenza A H1 NOT DETECTED NOT DETECTED Final   Influenza A H1 2009 NOT DETECTED NOT DETECTED Final   Influenza A H3 NOT DETECTED NOT DETECTED Final   Influenza B NOT DETECTED NOT DETECTED Final   Parainfluenza Virus 1 NOT DETECTED NOT DETECTED Final   Parainfluenza Virus 2 NOT DETECTED NOT DETECTED Final   Parainfluenza Virus 3 NOT DETECTED NOT DETECTED Final   Parainfluenza Virus 4 NOT DETECTED NOT DETECTED Final   Respiratory Syncytial Virus NOT DETECTED NOT DETECTED Final   Bordetella pertussis NOT DETECTED NOT DETECTED Final   Chlamydophila pneumoniae NOT DETECTED NOT DETECTED Final   Mycoplasma pneumoniae NOT DETECTED NOT DETECTED Final         Radiology Studies: No results found.      Scheduled Meds: . aspirin EC  81 mg Oral Daily  . benzonatate  100 mg Oral BID  . bisoprolol  10 mg Oral Daily  . clotrimazole   Topical BID  . dextromethorphan  15 mg Oral BID  . doxycycline  100 mg Oral Q12H  . enoxaparin (LOVENOX) injection  120 mg Subcutaneous Q24H  . guaiFENesin  1,200 mg Oral BID  . hydrALAZINE  25 mg Oral TID  . Influenza vac split quadrivalent PF  0.5 mL Intramuscular Tomorrow-1000  . insulin aspart  0-5 Units Subcutaneous QHS  . insulin aspart  0-9 Units Subcutaneous TID WC  . insulin glargine  12 Units Subcutaneous QHS  . levothyroxine  25 mcg Oral QAC breakfast  . methylPREDNISolone (SOLU-MEDROL) injection  40 mg Intravenous Q12H  . sodium chloride flush  3 mL Intravenous Q12H  . warfarin  5 mg Oral ONCE-1800  . Warfarin - Pharmacist Dosing Inpatient   Does not apply q1800   Continuous Infusions: . sodium chloride    . furosemide Stopped (11/09/16 1144)     LOS: 4 days     Alwyn Ren, MD [facility]  If 7PM-7AM, please contact night-coverage [email or web address] Password [facility] 11/09/2016, 4:48 PM

## 2016-11-09 NOTE — Progress Notes (Signed)
Admit: 11/04/2016 LOS: 4  31F with AoCKD3 and Right sided sCHF exacerbation with dyspnea, cough, hypervolemia, likely exacerbated by COPD + untreated OSA  Subjective:  Patient very defensive, challenging this morning. Provided updates and care as best able I spoke with Dr. Rennis Golden this morning regarding current cardiac and renal status Good urine output yesterday without diuretics, creatinine with slight improvement.  Did not use BiPAP machine   09/18 0701 - 09/19 0700 In: 1080 [P.O.:1080] Out: 1650 [Urine:1650]  Filed Weights   11/07/16 0354 11/08/16 0415 11/09/16 0417  Weight: 120 kg (264 lb 8.8 oz) 118.8 kg (262 lb) 119.4 kg (263 lb 4.8 oz)    Scheduled Meds: . apixaban  5 mg Oral BID  . aspirin EC  81 mg Oral Daily  . benzonatate  100 mg Oral BID  . bisoprolol  10 mg Oral Daily  . clotrimazole   Topical BID  . dextromethorphan  15 mg Oral BID  . doxycycline  100 mg Oral Q12H  . guaiFENesin  1,200 mg Oral BID  . hydrALAZINE  25 mg Oral TID  . Influenza vac split quadrivalent PF  0.5 mL Intramuscular Tomorrow-1000  . insulin aspart  0-5 Units Subcutaneous QHS  . insulin aspart  0-9 Units Subcutaneous TID WC  . insulin glargine  12 Units Subcutaneous QHS  . levothyroxine  25 mcg Oral QAC breakfast  . methylPREDNISolone (SOLU-MEDROL) injection  40 mg Intravenous Q12H  . sodium chloride flush  3 mL Intravenous Q12H   Continuous Infusions: . sodium chloride    . furosemide 120 mg (11/09/16 1044)   PRN Meds:.sodium chloride, acetaminophen, camphor-menthol, HYDROcodone-homatropine, ipratropium, levalbuterol, ondansetron (ZOFRAN) IV, sodium chloride flush  Current Labs: reviewed    Physical Exam:  Blood pressure (!) 149/56, pulse (!) 58, temperature (!) 97.4 F (36.3 C), temperature source Oral, resp. rate 15, height 5' (1.524 m), weight 119.4 kg (263 lb 4.8 oz), SpO2 100 %. GEN: Chronically ill-appearing, sitting in chair, using nasal cannula  ENT: NCAT  EYES: EOMI  CV:  Regular, no murmurs  PULM: Diminished breath sounds in the bases especially but throughout,  ABD: Soft, nontender  SKIN: No rashes or lesions  EXT:4+ edema in both legs into the knees  A 1. AoCKD3, related to decompensated R sided sCHF + diuretics 2. R sided CHF exacerbation 3. Presumed OSA, untreated 4. 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 Ongoing tobacco user 6. Hypervolemia 7. DM2 8. Hypothyroidism 9. HTN 10. AFlutter on NOAC 11. Anemia  P 1. Resume lasix, 120 mg IV 3 times a day 2. Continue fluid resection 3. Strongly encouraged patient to use BiPAP nocturnally here in the hospital and pursue sleep study upon discharge 4. continued counseling for tobacco cessation 5. Follow closely; BID BMP   Sue Roach 11/09/2016, 10:59 AM   Recent Labs Lab 11/07/16 0238 11/08/16 0403 11/09/16 0231  NA 134* 134* 136  K 5.3* 5.1 5.1  CL 100* 100* 102  CO2 GLUCOSE 187* 174* 136*  BUN 81* 97* 100*  CREATININE 2.35* 2.20* 1.93*  CALCIUM 8.5* 8.3* 8.5*  PHOS 4.6 5.1* 4.0    Recent Labs Lab 11/07/16 0238 11/08/16 0403 11/09/16 0231  WBC 9.2 9.0 7.9  NEUTROABS 8.3* 8.1* 7.0  HGB 9.9* 9.6* 9.8*  HCT 33.5* 32.4* 33.2*  MCV 87.5 87.3 87.6  PLT 280 274 276

## 2016-11-09 NOTE — Progress Notes (Signed)
ANTICOAGULATION CONSULT NOTE - Initial Consult  Pharmacy Consult for warfarin Indication: atrial fibrillation  Allergies  Allergen Reactions  . Sulfa Antibiotics Other (See Comments)    unknown  . Coreg [Carvedilol] Rash    Hair loss  . Zinc Rash   Patient Measurements: Height: 5' (152.4 cm) Weight: 263 lb 4.8 oz (119.4 kg) IBW/kg (Calculated) : 45.5  Vital Signs: Temp: 97.4 F (36.3 C) (09/19 0417) Temp Source: Oral (09/19 0417) BP: 149/56 (09/19 1040) Pulse Rate: 58 (09/19 1040)  Labs:  Recent Labs  11/07/16 0238 11/08/16 0403 11/09/16 0231  HGB 9.9* 9.6* 9.8*  HCT 33.5* 32.4* 33.2*  PLT 280 274 276  CREATININE 2.35* 2.20* 1.93*   Estimated Creatinine Clearance: 28.9 mL/min (A) (by C-G formula based on SCr of 1.93 mg/dL (H)).  Medical History: Past Medical History:  Diagnosis Date  . CHF (congestive heart failure) (HCC)   . Diabetes mellitus without complication (HCC)   . Hypertension   . Kidney disease   . Renal disorder   . Shingles    Medications:  Prescriptions Prior to Admission  Medication Sig Dispense Refill Last Dose  . acetaminophen (TYLENOL) 325 MG tablet Take 2 tablets (650 mg total) by mouth every 4 (four) hours as needed for mild pain, moderate pain, fever or headache.   unknown at PRN  . aspirin EC 81 MG tablet Take 81 mg by mouth daily.   11/04/2016 at Unknown time  . carvedilol (COREG) 3.125 MG tablet Take 3.125 mg by mouth 2 (two) times daily.  0 11/04/2016 at 0600  . docusate sodium (COLACE) 100 MG capsule Take 100 mg by mouth 2 (two) times daily as needed for constipation.   UNKNOWN at prn  . furosemide (LASIX) 80 MG tablet Take 1.5 tablets (120 mg total) by mouth 2 (two) times daily. (Patient taking differently: Take 160 mg by mouth 2 (two) times daily. )   11/04/2016 at Unknown time  . glimepiride (AMARYL) 4 MG tablet Take 4 mg by mouth daily.  1 11/04/2016 at Unknown time  . hydrALAZINE (APRESOLINE) 25 MG tablet Take 25 mg by mouth 3  (three) times daily.  0 11/04/2016 at Unknown time  . pioglitazone (ACTOS) 45 MG tablet Take 45 mg by mouth daily.  0 11/04/2016 at Unknown time  . VENTOLIN HFA 108 (90 Base) MCG/ACT inhaler Inhale 2 puffs into the lungs daily.  1 11/04/2016 at Unknown time    Assessment: Sue Roach a 76 y.o.female with CKD found to have atrial fibrillation. Initially started on Eliquis, but patient cannot afford long term and asked to transition to warfarin. Lovenox will be used until INR therapeutic. Last dose of Eliquis ~1000 this morning. Will schedule Warfarin to start tonight and Lovenox 12 hours after last Eliquis dose. sCr 1.93 CrCl ~29 mL/min at her baseline, will dose Lovenox conservatively once daily. HgB stable 9.8, PLT WNL  Goal of Therapy:  INR 2-3 Monitor platelets by anticoagulation protocol: Yes   Plan:  Warfarin  x1 tonight Lovenox  One daily Monitor renal function and for s/sx of bleeding  Sue Roach L William Laske 11/09/2016,12:56 PM

## 2016-11-09 NOTE — Progress Notes (Signed)
Patient resting comfortably on 2L [place] with no respiratory distress noted. BIPAP not needed at this time. RT will monitor as needed. 

## 2016-11-10 LAB — PROTIME-INR
INR: 1.5
Prothrombin Time: 18 seconds — ABNORMAL HIGH (ref 11.4–15.2)

## 2016-11-10 LAB — CBC
HCT: 36.3 % (ref 36.0–46.0)
Hemoglobin: 10.7 g/dL — ABNORMAL LOW (ref 12.0–15.0)
MCH: 25.7 pg — AB (ref 26.0–34.0)
MCHC: 29.5 g/dL — AB (ref 30.0–36.0)
MCV: 87.1 fL (ref 78.0–100.0)
PLATELETS: 291 10*3/uL (ref 150–400)
RBC: 4.17 MIL/uL (ref 3.87–5.11)
RDW: 18.4 % — AB (ref 11.5–15.5)
WBC: 8.8 10*3/uL (ref 4.0–10.5)

## 2016-11-10 LAB — GLUCOSE, CAPILLARY
GLUCOSE-CAPILLARY: 184 mg/dL — AB (ref 65–99)
GLUCOSE-CAPILLARY: 251 mg/dL — AB (ref 65–99)
Glucose-Capillary: 194 mg/dL — ABNORMAL HIGH (ref 65–99)
Glucose-Capillary: 200 mg/dL — ABNORMAL HIGH (ref 65–99)

## 2016-11-10 LAB — BASIC METABOLIC PANEL
Anion gap: 12 (ref 5–15)
Anion gap: 12 (ref 5–15)
BUN: 99 mg/dL — AB (ref 6–20)
BUN: 105 mg/dL — ABNORMAL HIGH (ref 6–20)
CALCIUM: 8.5 mg/dL — AB (ref 8.9–10.3)
CALCIUM: 8.3 mg/dL — AB (ref 8.9–10.3)
CHLORIDE: 96 mmol/L — AB (ref 101–111)
CHLORIDE: 94 mmol/L — AB (ref 101–111)
CO2: 31 mmol/L (ref 22–32)
CO2: 30 mmol/L (ref 22–32)
CREATININE: 1.86 mg/dL — AB (ref 0.44–1.00)
CREATININE: 1.93 mg/dL — AB (ref 0.44–1.00)
GFR calc Af Amer: 29 mL/min — ABNORMAL LOW (ref >60)
GFR calc non Af Amer: 25 mL/min — ABNORMAL LOW (ref >60)
GFR calc non Af Amer: 24 mL/min — ABNORMAL LOW (ref >60)
GFR, EST AFRICAN AMERICAN: 28 mL/min — AB (ref >60)
Glucose, Bld: 197 mg/dL — ABNORMAL HIGH (ref 65–99)
Glucose, Bld: 313 mg/dL — ABNORMAL HIGH (ref 65–99)
Potassium: 4.3 mmol/L (ref 3.5–5.1)
Potassium: 5.3 mmol/L — ABNORMAL HIGH (ref 3.5–5.1)
SODIUM: 136 mmol/L (ref 135–145)
Sodium: 139 mmol/L (ref 135–145)

## 2016-11-10 MED ORDER — WARFARIN VIDEO: Freq: Once | Status: DC

## 2016-11-10 MED ORDER — WARFARIN SODIUM 5 MG PO TABS
5.0000 mg | ORAL_TABLET | Freq: Once | ORAL | Status: AC
  Administered 2016-11-10: 5 mg via ORAL
  Filled 2016-11-10: qty 1

## 2016-11-10 MED ORDER — METHYLPREDNISOLONE SODIUM SUCC 40 MG IJ SOLR
40.0000 mg | Freq: Every day | INTRAMUSCULAR | Status: DC
  Administered 2016-11-10 – 2016-11-14 (×5): 40 mg via INTRAVENOUS
  Filled 2016-11-10 (×5): qty 1

## 2016-11-10 MED ORDER — COUMADIN BOOK
Freq: Once | Status: AC
  Administered 2016-11-10: 1
  Filled 2016-11-10: qty 1

## 2016-11-10 NOTE — Progress Notes (Signed)
PROGRESS NOTE    Sue Roach  ZOX:[id number] DOB: 02/01/1941 DOA: 11/04/2016 PCP: Kaleen Mask, MD   Brief Narrative: 76 y.o.femalewith medical history significant for obesity, chronic right-sided heart failure on diuretics with pulmonary HTN, DM 2on oral agents, chronic lower extremity stasis dermatitis, CKD 4,chronic anemia and hypertension. Patient reports that 3 days ago she developed a scratchy, sore throat with a headache and persistent coughing of clear mucoid sputum. No fevers no chills. No significant change in chronic lower extremity edema. She did use a prescribed cream on her left lower extremity yesterday but her skin began to burn and she removed the cream and noticed a residual redness/erythema after removal of the cream. She went to her PCP office today for she was found to be hypoxemic. She does not wear chronic oxygen. Upon presentation to the ER on room air her saturations were 60%. Chest x-ray revealed early edema without effusion and cardiomegaly.   She was also found to be in Atrial Flutter. Patient was admitted to SDU and Cardiology was consulted. Patient was placed on Anticoagulation and also placed on a Lasix gtt. She had severe wheezing but was refusing to go on BiPAP. She decompensated and was very Lethargic and found to be Hypercapnic with CO2 Narcosis so a Rapid was called but I was never notified by Nursing on 11/05/16. Dr. Royann Shivers called me to tell me what happened as he was in the Room when events took place and he consulted Dr. Isaiah Serge of PCCM.   PCCM evaluated and convinced patient to wear BiPAP and patient wore BiPAP intermittently until Saturations started dropping so then she agreed to go back on BiPAP. Patient ultimately refused BiPAP again. Cardiology evaluated and D/C'd Lasix gtt as Cr was rising; Cardiology worried about Cardiorenal but ECHO was normal. They may consider Iontropic Assistance for Diuresis. Nephrology was consulted for further  evaluation and recommendations given request for assistance in volume management as it appears consistent with Cardio-Renal. Patient reports she has not used her BiPAP last night.   Assessment & Plan:   Principal Problem:   Acute on chronic right-sided congestive heart failure (HCC) Active Problems:   Diabetes mellitus without complication (HCC)   Hypertension, uncontrolled   Hypothyroidism, adult   Acute respiratory failure with hypoxemia (HCC)   CKD (chronic kidney disease) stage 4, GFR 15-29 ml/min (HCC)   Chronic anemia   Atrial flutter (HCC)  Acute on chronic right-sided CHF exacerbation COPD exacerbation. Obstructive sleep apnea patient refuses CPAP CK D creatinine today 1.8.  Hypothyroidism Type 2 diabetes  Plan would be to continue current management patient reports having a lot of urine output and improvement in her symptoms on 3 times a day 120 mg of Lasix. Followed by cardiology and nephrology. Patient started on Lovenox and Coumadin for patient to be discharged on Coumadin. As she cannot afford apixaban.  DVT prophylaxis: Coumadin  Code Status:Full code  Family Communication none  Disposition Plan:tbd   Consultants: nephro,cardio.  Procedures: none  Antimicrobials: doxy   Subjective:Have dry cough   Objective: Vitals:   11/09/16 2134 11/09/16 2320 11/10/16 0304 11/10/16 0540  BP: (!) 143/68 (!) 151/69  (!) 133/58  Pulse: 60 60  60  Resp: (!) Temp: 98.1 F (36.7 C) 97.7 F (36.5 C)  98 F (36.7 C)  TempSrc: Oral Oral  Oral  SpO2: 91% 92% 93% 93%  Weight:    115.7 kg (255 lb)  Height:  Intake/Output Summary (Last 24 hours) at 11/10/16 1434 Last data filed at 11/10/16 1200  Gross per 24 hour  Intake              182 ml  Output             4150 ml  Net            -3968 ml   Filed Weights   11/08/16 0415 11/09/16 0417 11/10/16 0540  Weight: 118.8 kg (262 lb) 119.4 kg (263 lb 4.8 oz) 115.7 kg (255 lb)     Examination:  General exam: Appears calm and comfortable  Respiratory system: diminished BS. Respiratory effort normal. Cardiovascular system: S1 & S2 heard, RRR. No JVD, murmurs, rubs, gallops or clicks. No pedal edema. Gastrointestinal system: Abdomen is nondistended, soft and nontender. No organomegaly or masses felt. Normal bowel sounds heard. Central nervous system: Alert and oriented. No focal neurological deficits. Extremities: Symmetric 5 x 5 power. Skin: No rashes, lesions or ulcers Psychiatry: Judgement and insight appear normal. Mood & affect appropriate.     Data Reviewed: I have personally reviewed following labs and imaging studies  CBC:  Recent Labs Lab 11/04/16 1315 11/06/16 0340 11/07/16 0238 11/08/16 0403 11/09/16 0231 11/10/16 0247  WBC 7.1 6.3 9.2 9.0 7.9 8.8  NEUTROABS 5.0 5.6 8.3* 8.1* 7.0  --   HGB 10.1* 10.7* 9.9* 9.6* 9.8* 10.7*  HCT 32.8* 36.6 33.5* 32.4* 33.2* 36.3  MCV 86.8 89.3 87.5 87.3 87.6 87.1  PLT 240 281 280 274 276 291   Basic Metabolic Panel:  Recent Labs Lab 11/05/16 0216 11/06/16 0340 11/07/16 0238 11/08/16 0403 11/09/16 0231 11/09/16 1836 11/10/16 1155  NA 139 136 134* 134* 136 136 139  K 3.6 4.8 5.3* 5.1 5.1 4.9 4.3  CL 100* 100* 100* 100* 102 99* 96*  CO2 GLUCOSE 108* 121* 187* 174* 136* 212* 197*  BUN 45* 56* 81* 97* 100* 99* 99*  CREATININE 1.93* 2.11* 2.35* 2.20* 1.93* 2.01* 1.86*  CALCIUM 8.4* 8.5* 8.5* 8.3* 8.5* 8.3* 8.5*  MG 2.5* 2.6* 2.5* 2.6* 2.7*  --   --   PHOS  --  4.9* 4.6 5.1* 4.0  --   --    GFR: Estimated Creatinine Clearance: 29.4 mL/min (A) (by C-G formula based on SCr of 1.86 mg/dL (H)). Liver Function Tests:  Recent Labs Lab 11/06/16 0340 11/07/16 0238 11/08/16 0403 11/09/16 0231  AST 26 27 34 27  ALT 21 25 40 44  ALKPHOS 96 88 78 77  BILITOT 1.0 0.7 0.7 0.9  PROT 7.6 6.9 6.8 6.6  ALBUMIN 3.2* 3.0* 3.0* 3.1*   No results for input(s): LIPASE, AMYLASE in the  last 168 hours. No results for input(s): AMMONIA in the last 168 hours. Coagulation Profile:  Recent Labs Lab 11/10/16 0247  INR 1.50   Cardiac Enzymes:  Recent Labs Lab 11/04/16 1412 11/04/16 2042 11/05/16 0216 11/05/16 1009  TROPONINI 0.06* 0.06* 0.06* 0.05*   BNP (last 3 results) No results for input(s): PROBNP in the last 8760 hours. HbA1C: No results for input(s): HGBA1C in the last 72 hours. CBG:  Recent Labs Lab 11/09/16 1128 11/09/16 1745 11/09/16 2120 11/10/16 0757 11/10/16 1203  GLUCAP 277* 194* 142* 194* 184*   Lipid Profile: No results for input(s): CHOL, HDL, LDLCALC, TRIG, CHOLHDL, LDLDIRECT in the last 72 hours. Thyroid Function Tests: No results for input(s): TSH, T4TOTAL, FREET4, T3FREE, THYROIDAB in the last 72 hours. Anemia  Panel: No results for input(s): VITAMINB12, FOLATE, FERRITIN, TIBC, IRON, RETICCTPCT in the last 72 hours. Sepsis Labs: No results for input(s): PROCALCITON, LATICACIDVEN in the last 168 hours.  Recent Results (from the past 240 hour(s))  MRSA PCR Screening     Status: None   Collection Time: 11/04/16 10:09 PM  Result Value Ref Range Status   MRSA by PCR NEGATIVE NEGATIVE Final    Comment:        The GeneXpert MRSA Assay (FDA approved for NASAL specimens only), is one component of a comprehensive MRSA colonization surveillance program. It is not intended to diagnose MRSA infection nor to guide or monitor treatment for MRSA infections.   Respiratory Panel by PCR     Status: Abnormal   Collection Time: 11/05/16  3:50 AM  Result Value Ref Range Status   Adenovirus NOT DETECTED NOT DETECTED Final   Coronavirus 229E NOT DETECTED NOT DETECTED Final   Coronavirus HKU1 NOT DETECTED NOT DETECTED Final   Coronavirus NL63 NOT DETECTED NOT DETECTED Final   Coronavirus OC43 NOT DETECTED NOT DETECTED Final   Metapneumovirus NOT DETECTED NOT DETECTED Final   Rhinovirus / Enterovirus DETECTED (A) NOT DETECTED Final    Influenza A NOT DETECTED NOT DETECTED Final   Influenza A H1 NOT DETECTED NOT DETECTED Final   Influenza A H1 2009 NOT DETECTED NOT DETECTED Final   Influenza A H3 NOT DETECTED NOT DETECTED Final   Influenza B NOT DETECTED NOT DETECTED Final   Parainfluenza Virus 1 NOT DETECTED NOT DETECTED Final   Parainfluenza Virus 2 NOT DETECTED NOT DETECTED Final   Parainfluenza Virus 3 NOT DETECTED NOT DETECTED Final   Parainfluenza Virus 4 NOT DETECTED NOT DETECTED Final   Respiratory Syncytial Virus NOT DETECTED NOT DETECTED Final   Bordetella pertussis NOT DETECTED NOT DETECTED Final   Chlamydophila pneumoniae NOT DETECTED NOT DETECTED Final   Mycoplasma pneumoniae NOT DETECTED NOT DETECTED Final         Radiology Studies: No results found.      Scheduled Meds: . aspirin EC  81 mg Oral Daily  . benzonatate  100 mg Oral BID  . bisoprolol  10 mg Oral Daily  . clotrimazole   Topical BID  . dextromethorphan  15 mg Oral BID  . doxycycline  100 mg Oral Q12H  . enoxaparin (LOVENOX) injection  120 mg Subcutaneous Q24H  . guaiFENesin  1,200 mg Oral BID  . hydrALAZINE  25 mg Oral TID  . Influenza vac split quadrivalent PF  0.5 mL Intramuscular Tomorrow-1000  . insulin aspart  0-5 Units Subcutaneous QHS  . insulin aspart  0-9 Units Subcutaneous TID WC  . insulin glargine  12 Units Subcutaneous QHS  . levothyroxine  25 mcg Oral QAC breakfast  . methylPREDNISolone (SOLU-MEDROL) injection  40 mg Intravenous Daily  . sodium chloride flush  3 mL Intravenous Q12H  . warfarin  5 mg Oral ONCE-1800  . warfarin   Does not apply Once  . Warfarin - Pharmacist Dosing Inpatient   Does not apply q1800   Continuous Infusions: . sodium chloride    . furosemide 120 mg (11/10/16 1426)     LOS: 5 days      Alwyn Ren, MD [facility]  If 7PM-7AM, please contact night-coverage [email or web address] Password [facility] 11/10/2016, 2:34 PM

## 2016-11-10 NOTE — Progress Notes (Signed)
Pt is alert up in the chair using the bsc. Iv lasix ordered Elevated legs for swelling and weeping. Weak and exertion on 2l o2 weaning.

## 2016-11-10 NOTE — Progress Notes (Addendum)
ANTICOAGULATION CONSULT NOTE - Initial Consult  Pharmacy Consult for warfarin Indication: atrial fibrillation  Allergies  Allergen Reactions  . Sulfa Antibiotics Other (See Comments)    unknown  . Coreg [Carvedilol] Rash    Hair loss  . Zinc Rash   Patient Measurements: Height: 5' (152.4 cm) Weight: 255 lb (115.7 kg) IBW/kg (Calculated) : 45.5  Vital Signs: Temp: 98 F (36.7 C) (09/20 0540) Temp Source: Oral (09/20 0540) BP: 133/58 (09/20 0540) Pulse Rate: 60 (09/20 0540)  Labs:  Recent Labs  11/08/16 0403 11/09/16 0231 11/09/16 1836 11/10/16 0247  HGB 9.6* 9.8*  --  10.7*  HCT 32.4* 33.2*  --  36.3  PLT 274 276  --  291  LABPROT  --   --   --  18.0*  INR  --   --   --  1.50  CREATININE 2.20* 1.93* 2.01*  --    Estimated Creatinine Clearance: 27.2 mL/min (A) (by C-G formula based on SCr of 2.01 mg/dL (H)).  Medical History: Past Medical History:  Diagnosis Date  . CHF (congestive heart failure) (HCC)   . Diabetes mellitus without complication (HCC)   . Hypertension   . Kidney disease   . Renal disorder   . Shingles    Medications:  Prescriptions Prior to Admission  Medication Sig Dispense Refill Last Dose  . acetaminophen (TYLENOL) 325 MG tablet Take 2 tablets (650 mg total) by mouth every 4 (four) hours as needed for mild pain, moderate pain, fever or headache.   unknown at PRN  . aspirin EC 81 MG tablet Take 81 mg by mouth daily.   11/04/2016 at Unknown time  . carvedilol (COREG) 3.125 MG tablet Take 3.125 mg by mouth 2 (two) times daily.  0 11/04/2016 at 0600  . docusate sodium (COLACE) 100 MG capsule Take 100 mg by mouth 2 (two) times daily as needed for constipation.   UNKNOWN at prn  . furosemide (LASIX) 80 MG tablet Take 1.5 tablets (120 mg total) by mouth 2 (two) times daily. (Patient taking differently: Take 160 mg by mouth 2 (two) times daily. )   11/04/2016 at Unknown time  . glimepiride (AMARYL) 4 MG tablet Take 4 mg by mouth daily.  1 11/04/2016 at  Unknown time  . hydrALAZINE (APRESOLINE) 25 MG tablet Take 25 mg by mouth 3 (three) times daily.  0 11/04/2016 at Unknown time  . pioglitazone (ACTOS) 45 MG tablet Take 45 mg by mouth daily.  0 11/04/2016 at Unknown time  . VENTOLIN HFA 108 (90 Base) MCG/ACT inhaler Inhale 2 puffs into the lungs daily.  1 11/04/2016 at Unknown time    Assessment: Sue Roach a 76 y.o.female with CKD found to have atrial fibrillation. Initially started on Eliquis, but patient cannot afford long term and asked to transition to warfarin. Lovenox will be used until INR therapeutic. Last dose of Eliquis ~1000 on 9/19.  INR is 1.5 today. Received 5 mg warfarin last night. CBC stable. No signs/symptoms of bleeding noted in chart.  On concurrent doxycycline and methylprednisolone, which could impact warfarin sensitivity.   Goal of Therapy:  INR 2-3 Monitor platelets by anticoagulation protocol: Yes   Plan:  Warfarin  x1 tonight Lovenox  once daily Monitor renal function, INR, CBC and for s/sx of bleeding  Girard Cooter, PharmD Clinical Pharmacist  Phone: [phone] 11/10/2016,8:48 AM

## 2016-11-10 NOTE — Progress Notes (Signed)
DAILY PROGRESS NOTE   Patient Name: Sue Roach Date of Encounter: 11/10/2016  Hospital Problem List   Principal Problem:   Acute on chronic right-sided congestive heart failure [facility]) Active Problems:   Diabetes mellitus without complication (HCC)   Hypertension, uncontrolled   Hypothyroidism, adult   Acute respiratory failure with hypoxemia (HCC)   CKD (chronic kidney disease) stage 4, GFR 15-29 ml/min (HCC)   Chronic anemia   Atrial flutter [facility])    Chief Complaint   Still has cough  Subjective   Appreciate nephrology recs. Excellent urine output overnight with TID lasix. Now 4L negative. Creatinine had been rising - awaiting labs today. Weight down from 263 to 255 today.  Objective   Vitals:   11/09/16 2134 11/09/16 2320 11/10/16 0304 11/10/16 0540  BP: (!) 143/68 (!) 151/69  (!) 133/58  Pulse: 60 60  60  Resp: (!) _0 Temp: 98.1 F (36.7 C) 97.7 F (36.5 C)  98 F (36.7 C)  TempSrc: Oral Oral  Oral  SpO2: 91% 92% 93% 93%  Weight:    255 lb (115.7 kg)  Height:        Intake/Output Summary (Last 24 hours) at 11/10/16 1046 Last data filed at 11/10/16 0615  Gross per 24 hour  Intake              182 ml  Output             3150 ml  Net            -2968 ml   Filed Weights   11/08/16 0415 11/09/16 0417 11/10/16 0540  Weight: 262 lb (118.8 kg) 263 lb 4.8 oz (119.4 kg) 255 lb (115.7 kg)    Physical Exam   General appearance: alert, appears older than stated age, no distress and morbidly obese Neck: JVD - 2 cm above sternal notch, no carotid bruit and thyroid not enlarged, symmetric, no tenderness/mass/nodules Lungs: diminished breath sounds bilaterally Heart: irregularly irregular rhythm Abdomen: protuberant, firm Extremities: edema 1+ woody stasis edema and venous stasis dermatitis noted Pulses: 2+ and symmetric Skin: Skin color, texture, turgor normal. No rashes or lesions Neurologic: Mental status: Alert, oriented, thought content  appropriate Psych: Pleasatn  Inpatient Medications    Scheduled Meds: . aspirin EC  81 mg Oral Daily  . benzonatate  100 mg Oral BID  . bisoprolol  10 mg Oral Daily  . clotrimazole   Topical BID  . coumadin book   Does not apply Once  . dextromethorphan  15 mg Oral BID  . doxycycline  100 mg Oral Q12H  . enoxaparin (LOVENOX) injection  120 mg Subcutaneous Q24H  . guaiFENesin  1,200 mg Oral BID  . hydrALAZINE  25 mg Oral TID  . Influenza vac split quadrivalent PF  0.5 mL Intramuscular Tomorrow-1000  . insulin aspart  0-5 Units Subcutaneous QHS  . insulin aspart  0-9 Units Subcutaneous TID WC  . insulin glargine  12 Units Subcutaneous QHS  . levothyroxine  25 mcg Oral QAC breakfast  . methylPREDNISolone (SOLU-MEDROL) injection  40 mg Intravenous Daily  . sodium chloride flush  3 mL Intravenous Q12H  . warfarin  5 mg Oral ONCE-1800  . warfarin   Does not apply Once  . Warfarin - Pharmacist Dosing Inpatient   Does not apply q1800    Continuous Infusions: . sodium chloride    . furosemide Stopped (11/10/16 0746)    PRN Meds: sodium chloride, acetaminophen, camphor-menthol, HYDROcodone-homatropine, ipratropium,  levalbuterol, ondansetron (ZOFRAN) IV, sodium chloride flush   Labs   Results for orders placed or performed during the hospital encounter of 11/04/16 (from the past 48 hour(s))  Glucose, capillary     Status: Abnormal   Collection Time: 11/08/16 11:52 AM  Result Value Ref Range   Glucose-Capillary 268 (H) 65 - 99 mg/dL   Comment 1 Notify RN    Comment 2 Document in Chart   Glucose, capillary     Status: Abnormal   Collection Time: 11/08/16  4:34 PM  Result Value Ref Range   Glucose-Capillary 216 (H) 65 - 99 mg/dL   Comment 1 Notify RN    Comment 2 Document in Chart   Glucose, capillary     Status: Abnormal   Collection Time: 11/08/16  9:25 PM  Result Value Ref Range   Glucose-Capillary 208 (H) 65 - 99 mg/dL  CBC with Differential/Platelet     Status: Abnormal     Collection Time: 11/09/16  2:31 AM  Result Value Ref Range   WBC 7.9 4.0 - 10.5 K/uL   RBC 3.79 (L) 3.87 - 5.11 MIL/uL   Hemoglobin 9.8 (L) 12.0 - 15.0 g/dL   HCT 33.2 (L) 36.0 - 46.0 %   MCV 87.6 78.0 - 100.0 fL   MCH 25.9 (L) 26.0 - 34.0 pg   MCHC 29.5 (L) 30.0 - 36.0 g/dL   RDW 18.5 (H) 11.5 - 15.5 %   Platelets 276 150 - 400 K/uL   Neutrophils Relative % 88 %   Neutro Abs 7.0 1.7 - 7.7 K/uL   Lymphocytes Relative 7 %   Lymphs Abs 0.5 (L) 0.7 - 4.0 K/uL   Monocytes Relative 5 %   Monocytes Absolute 0.4 0.1 - 1.0 K/uL   Eosinophils Relative 0 %   Eosinophils Absolute 0.0 0.0 - 0.7 K/uL   Basophils Relative 0 %   Basophils Absolute 0.0 0.0 - 0.1 K/uL  Comprehensive metabolic panel     Status: Abnormal   Collection Time: 11/09/16  2:31 AM  Result Value Ref Range   Sodium 136 135 - 145 mmol/L   Potassium 5.1 3.5 - 5.1 mmol/L   Chloride 102 101 - 111 mmol/L   CO2 28 22 - 32 mmol/L   Glucose, Bld 136 (H) 65 - 99 mg/dL   BUN 100 (H) 6 - 20 mg/dL   Creatinine, Ser 1.93 (H) 0.44 - 1.00 mg/dL   Calcium 8.5 (L) 8.9 - 10.3 mg/dL   Total Protein 6.6 6.5 - 8.1 g/dL   Albumin 3.1 (L) 3.5 - 5.0 g/dL   AST 27 15 - 41 U/L   ALT 44 14 - 54 U/L   Alkaline Phosphatase 77 38 - 126 U/L   Total Bilirubin 0.9 0.3 - 1.2 mg/dL   GFR calc non Af Amer 24 (L) >60 mL/min   GFR calc Af Amer 28 (L) >60 mL/min    Comment: (NOTE) The eGFR has been calculated using the CKD EPI equation. This calculation has not been validated in all clinical situations. eGFR's persistently <60 mL/min signify possible Chronic Kidney Disease.    Anion gap 6 5 - 15  Magnesium     Status: Abnormal   Collection Time: 11/09/16  2:31 AM  Result Value Ref Range   Magnesium 2.7 (H) 1.7 - 2.4 mg/dL  Phosphorus     Status: None   Collection Time: 11/09/16  2:31 AM  Result Value Ref Range   Phosphorus 4.0 2.5 - 4.6 mg/dL  Glucose, capillary     Status: Abnormal   Collection Time: 11/09/16 11:28 AM  Result Value Ref  Range   Glucose-Capillary 277 (H) 65 - 99 mg/dL   Comment 1 Notify RN    Comment 2 Document in Chart   Glucose, capillary     Status: Abnormal   Collection Time: 11/09/16  5:45 PM  Result Value Ref Range   Glucose-Capillary 194 (H) 65 - 99 mg/dL  Basic metabolic panel     Status: Abnormal   Collection Time: 11/09/16  6:36 PM  Result Value Ref Range   Sodium 136 135 - 145 mmol/L   Potassium 4.9 3.5 - 5.1 mmol/L    Comment: SLIGHT HEMOLYSIS   Chloride 99 (L) 101 - 111 mmol/L   CO2 28 22 - 32 mmol/L   Glucose, Bld 212 (H) 65 - 99 mg/dL   BUN 99 (H) 6 - 20 mg/dL   Creatinine, Ser 2.01 (H) 0.44 - 1.00 mg/dL   Calcium 8.3 (L) 8.9 - 10.3 mg/dL   GFR calc non Af Amer 23 (L) >60 mL/min   GFR calc Af Amer 26 (L) >60 mL/min    Comment: (NOTE) The eGFR has been calculated using the CKD EPI equation. This calculation has not been validated in all clinical situations. eGFR's persistently <60 mL/min signify possible Chronic Kidney Disease.    Anion gap 9 5 - 15  Glucose, capillary     Status: Abnormal   Collection Time: 11/09/16  9:20 PM  Result Value Ref Range   Glucose-Capillary 142 (H) 65 - 99 mg/dL  Protime-INR     Status: Abnormal   Collection Time: 11/10/16  2:47 AM  Result Value Ref Range   Prothrombin Time 18.0 (H) 11.4 - 15.2 seconds   INR 1.50   CBC     Status: Abnormal   Collection Time: 11/10/16  2:47 AM  Result Value Ref Range   WBC 8.8 4.0 - 10.5 K/uL   RBC 4.17 3.87 - 5.11 MIL/uL   Hemoglobin 10.7 (L) 12.0 - 15.0 g/dL   HCT 36.3 36.0 - 46.0 %   MCV 87.1 78.0 - 100.0 fL   MCH 25.7 (L) 26.0 - 34.0 pg   MCHC 29.5 (L) 30.0 - 36.0 g/dL   RDW 18.4 (H) 11.5 - 15.5 %   Platelets 291 150 - 400 K/uL  Glucose, capillary     Status: Abnormal   Collection Time: 11/10/16  7:57 AM  Result Value Ref Range   Glucose-Capillary 194 (H) 65 - 99 mg/dL   Comment 1 Notify RN     ECG   N/A  Telemetry   Atrial flutter with slow VR - Personally Reviewed  Radiology    No  results found.  Cardiac Studies   N/A  Assessment   Principal Problem:   Acute on chronic right-sided congestive heart failure (HCC) Active Problems:   Diabetes mellitus without complication (HCC)   Hypertension, uncontrolled   Hypothyroidism, adult   Acute respiratory failure with hypoxemia (HCC)   CKD (chronic kidney disease) stage 4, GFR 15-29 ml/min (HCC)   Chronic anemia   Atrial flutter ([place])   Plan   1. Seems to be diuresing well on TID lasix - perhaps we needed a higher dose to reach threshold. Creatinine pending - renal has been following. Would continue diuresis.  Time Spent Directly with Patient:  I have spent a total of 15 minutes with the patient reviewing hospital notes, telemetry, EKGs, labs and examining the patient  as well as establishing an assessment and plan that was discussed personally with the patient. > 50% of time was spent in direct patient care.  Length of Stay:  LOS: 5 days   Pixie Casino, MD, [place]  Attending Cardiologist  Direct Dial: [phone]  Fax: [phone]  Website:  [email or web address] 11/10/2016, 10:46 AM

## 2016-11-10 NOTE — Progress Notes (Signed)
Inpatient Diabetes Program Recommendations  AACE/ADA: New Consensus Statement on Inpatient Glycemic Control (2015)  Target Ranges:  Prepandial:   less than 140 mg/dL      Peak postprandial:   less than 180 mg/dL (1-2 hours)      Critically ill patients:  140 - 180 mg/dL   Results for Sue Roach, Sue Roach (MRN [id number]) as of 11/10/2016 08:14  Ref. Range 11/08/2016 21:25 11/09/2016 11:28 11/09/2016 17:45 11/09/2016 21:20 11/10/2016 07:57  Glucose-Capillary Latest Ref Range: 65 - 99 mg/dL 086 (H) 578 (H) 469 (H) 142 (H) 194 (H)  Results for [name], [name] (MRN [id number]) as of 11/10/2016 08:14  Ref. Range 11/04/2016 20:42  Hemoglobin A1C Latest Ref Range: 4.8 - 5.6 % 6.7 (H)   Review of Glycemic Control  Diabetes history: DM2 Outpatient Diabetes medications: Actos 45 mg daily, Amaryl 4 mg daily Current orders for Inpatient glycemic control: Lantus 12 units QHS, Novolog 0-9 units TID with meals, Novolog 0-5 units QHS  Inpatient Diabetes Program Recommendations:  Insulin - Meal Coverage: If steroids are continued, please consider ordering Novolog 3 units TID with meals for meal coverage if patient eats at least 50% of meals.  Thanks, Orlando Penner, RN, MSN, CDE Diabetes Coordinator Inpatient Diabetes Program [phone] (Team Pager from 8am to 5pm)

## 2016-11-10 NOTE — Progress Notes (Signed)
Admit: 11/04/2016 LOS: 5  66F with AoCKD3 and Right sided sCHF exacerbation with dyspnea, cough, hypervolemia, likely exacerbated by COPD + untreated OSA  Subjective:  Great UOP yesterda Pt states breathing improved this AM, less cough/congestion No positive pressure when sleeping AM lab spending; weight down  09/19 0701 - 09/20 0700 In: 302 [P.O.:240; IV Piggyback:62] Out: 3150 [Urine:3150]  Filed Weights   11/08/16 0415 11/09/16 0417 11/10/16 0540  Weight: 118.8 kg (262 lb) 119.4 kg (263 lb 4.8 oz) 115.7 kg (255 lb)    Scheduled Meds: . aspirin EC  81 mg Oral Daily  . benzonatate  100 mg Oral BID  . bisoprolol  10 mg Oral Daily  . clotrimazole   Topical BID  . coumadin book   Does not apply Once  . dextromethorphan  15 mg Oral BID  . doxycycline  100 mg Oral Q12H  . enoxaparin (LOVENOX) injection  120 mg Subcutaneous Q24H  . guaiFENesin  1,200 mg Oral BID  . hydrALAZINE  25 mg Oral TID  . Influenza vac split quadrivalent PF  0.5 mL Intramuscular Tomorrow-1000  . insulin aspart  0-5 Units Subcutaneous QHS  . insulin aspart  0-9 Units Subcutaneous TID WC  . insulin glargine  12 Units Subcutaneous QHS  . levothyroxine  25 mcg Oral QAC breakfast  . methylPREDNISolone (SOLU-MEDROL) injection  40 mg Intravenous Daily  . sodium chloride flush  3 mL Intravenous Q12H  . warfarin  5 mg Oral ONCE-1800  . warfarin   Does not apply Once  . Warfarin - Pharmacist Dosing Inpatient   Does not apply q1800   Continuous Infusions: . sodium chloride    . furosemide Stopped (11/10/16 0746)   PRN Meds:.sodium chloride, acetaminophen, camphor-menthol, HYDROcodone-homatropine, ipratropium, levalbuterol, ondansetron (ZOFRAN) IV, sodium chloride flush  Current Labs: reviewed    Physical Exam:  Blood pressure (!) 133/58, pulse 60, temperature 98 F (36.7 C), temperature source Oral, resp. rate 20, height 5' (1.524 m), weight 115.7 kg (255 lb), SpO2 93 %. GEN: Chronically ill-appearing,  sitting in chair, using nasal cannula  ENT: NCAT  EYES: EOMI  CV: Regular, no murmurs  PULM: Diminished breath sounds in the bases especially but throughout,  ABD: Soft, nontender  SKIN: No rashes or lesions  EXT:4+ edema in both legs into the knees  A 1. AoCKD3, related to decompensated R sided sCHF + diuretics 2. R sided CHF exacerbation 3. Presumed OSA, untreated 4. 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 Ongoing tobacco user 6. Hypervolemia 7. DM2 8. Hypothyroidism 9. HTN 10. AFlutter on NOAC 11. Anemia  P 1. Cont lasix, 120 mg IV 3 times a day 2. BMP pending this AM 3. Continue fluid resection 4. Follow closely; BID BMP   Sabra Heck MD 11/10/2016, 10:15 AM   Recent Labs Lab 11/07/16 0238 11/08/16 0403 11/09/16 0231 11/09/16 1836  NA 134* 134* 136 136  K 5.3* 5.1 5.1 4.9  CL 100* 100* 102 99*  CO2 GLUCOSE 187* 174* 136* 212*  BUN 81* 97* 100* 99*  CREATININE 2.35* 2.20* 1.93* 2.01*  CALCIUM 8.5* 8.3* 8.5* 8.3*  PHOS 4.6 5.1* 4.0  --     Recent Labs Lab 11/07/16 0238 11/08/16 0403 11/09/16 0231 11/10/16 0247  WBC 9.2 9.0 7.9 8.8  NEUTROABS 8.3* 8.1* 7.0  --   HGB 9.9* 9.6* 9.8* 10.7*  HCT 33.5* 32.4* 33.2* 36.3  MCV 87.5 87.3 87.6 87.1  PLT 280 274 276 291

## 2016-11-11 LAB — CBC
HCT: 36 % (ref 36.0–46.0)
HEMOGLOBIN: 10.8 g/dL — AB (ref 12.0–15.0)
MCH: 26.1 pg (ref 26.0–34.0)
MCHC: 30 g/dL (ref 30.0–36.0)
MCV: 87 fL (ref 78.0–100.0)
PLATELETS: 316 10*3/uL (ref 150–400)
RBC: 4.14 MIL/uL (ref 3.87–5.11)
RDW: 18.7 % — ABNORMAL HIGH (ref 11.5–15.5)
WBC: 8.5 10*3/uL (ref 4.0–10.5)

## 2016-11-11 LAB — GLUCOSE, CAPILLARY
GLUCOSE-CAPILLARY: 137 mg/dL — AB (ref 65–99)
GLUCOSE-CAPILLARY: 174 mg/dL — AB (ref 65–99)
Glucose-Capillary: 129 mg/dL — ABNORMAL HIGH (ref 65–99)
Glucose-Capillary: 251 mg/dL — ABNORMAL HIGH (ref 65–99)

## 2016-11-11 LAB — BASIC METABOLIC PANEL
ANION GAP: 11 (ref 5–15)
Anion gap: 11 (ref 5–15)
BUN: 105 mg/dL — AB (ref 6–20)
BUN: 100 mg/dL — ABNORMAL HIGH (ref 6–20)
CALCIUM: 8.3 mg/dL — AB (ref 8.9–10.3)
CO2: 32 mmol/L (ref 22–32)
CO2: 33 mmol/L — ABNORMAL HIGH (ref 22–32)
CREATININE: 1.77 mg/dL — AB (ref 0.44–1.00)
CREATININE: 1.81 mg/dL — AB (ref 0.44–1.00)
Calcium: 8.2 mg/dL — ABNORMAL LOW (ref 8.9–10.3)
Chloride: 96 mmol/L — ABNORMAL LOW (ref 101–111)
Chloride: 94 mmol/L — ABNORMAL LOW (ref 101–111)
GFR calc Af Amer: 31 mL/min — ABNORMAL LOW (ref >60)
GFR calc non Af Amer: 27 mL/min — ABNORMAL LOW (ref >60)
GFR, EST AFRICAN AMERICAN: 30 mL/min — AB (ref >60)
GFR, EST NON AFRICAN AMERICAN: 26 mL/min — AB (ref >60)
GLUCOSE: 146 mg/dL — AB (ref 65–99)
Glucose, Bld: 223 mg/dL — ABNORMAL HIGH (ref 65–99)
POTASSIUM: 4.6 mmol/L (ref 3.5–5.1)
Potassium: 4.9 mmol/L (ref 3.5–5.1)
SODIUM: 138 mmol/L (ref 135–145)
Sodium: 139 mmol/L (ref 135–145)

## 2016-11-11 LAB — PROTIME-INR
INR: 1.3
PROTHROMBIN TIME: 16.1 s — AB (ref 11.4–15.2)

## 2016-11-11 MED ORDER — WARFARIN SODIUM 5 MG PO TABS
5.0000 mg | ORAL_TABLET | Freq: Once | ORAL | Status: AC
  Administered 2016-11-11: 5 mg via ORAL
  Filled 2016-11-11: qty 1

## 2016-11-11 MED ORDER — BISOPROLOL FUMARATE 5 MG PO TABS
10.0000 mg | ORAL_TABLET | Freq: Every day | ORAL | Status: DC
Start: 2016-11-11 — End: 2016-11-11
  Administered 2016-11-11: 10 mg via ORAL
  Filled 2016-11-11: qty 2

## 2016-11-11 MED ORDER — FUROSEMIDE 10 MG/ML IJ SOLN
120.0000 mg | Freq: Two times a day (BID) | INTRAVENOUS | Status: DC
  Administered 2016-11-11 – 2016-11-13 (×5): 120 mg via INTRAVENOUS
  Filled 2016-11-11 (×3): qty 10
  Filled 2016-11-11: qty 12
  Filled 2016-11-11: qty 10
  Filled 2016-11-11: qty 12

## 2016-11-11 NOTE — Progress Notes (Signed)
Pt unable to tolerate laying back for EKG at this time. Pt began to have coughing spell. Pt given PRN hycodan. Will re-attempt EKG.

## 2016-11-11 NOTE — Progress Notes (Signed)
Text page to [facility], [place].  3e20 Don Broach-  pt complaining of dizziness, BP 154/75, HR dropping down as low as 36. Pt states she just doesn't feel good.

## 2016-11-11 NOTE — Progress Notes (Signed)
Inpatient Diabetes Program Recommendations  AACE/ADA: New Consensus Statement on Inpatient Glycemic Control (2015)  Target Ranges:  Prepandial:   less than 140 mg/dL      Peak postprandial:   less than 180 mg/dL (1-2 hours)      Critically ill patients:  140 - 180 mg/dL   Results for MOXIE, KALIL (MRN [id number]) as of 11/11/2016 09:03  Ref. Range 11/10/2016 07:57 11/10/2016 12:03 11/10/2016 16:45 11/10/2016 21:25 11/11/2016 07:17  Glucose-Capillary Latest Ref Range: 65 - 99 mg/dL 409 (H) 811 (H) 914 (H) 251 (H) 129 (H)   Review of Glycemic Control  Diabetes history: DM2 Outpatient Diabetes medications: Actos 45 mg daily, Amaryl 4 mg daily Current orders for Inpatient glycemic control: Lantus 12 units QHS, Novolog 0-9 units TID with meals, Novolog 0-5 units QHS  Inpatient Diabetes Program Recommendations:  Insulin - Meal Coverage: Post prandial glucose is consistently elevated. If steroids are continued, please consider ordering Novolog 3 units TID with meals for meal coverage if patient eats at least 50% of meals.  Thanks, Orlando Penner, RN, MSN, CDE Diabetes Coordinator Inpatient Diabetes Program [phone] (Team Pager from 8am to 5pm)

## 2016-11-11 NOTE — Progress Notes (Signed)
PT Cancellation Note  Patient Details Name: Sue Roach MRN: [id number] DOB: 1940-05-11   Cancelled Treatment:    Reason Eval/Treat Not Completed: Medical issues which prohibited therapy.  Pulses are in 30's to 40's and dizzy continually.  Will ask vestibular PT to check her and clear for this due to magnitude of her symptoms.   Ivar Drape 11/11/2016, 4:59 PM   Samul Dada, PT MS Acute Rehab Dept. Number: [facility] [id number] and [facility] [phone]

## 2016-11-11 NOTE — Progress Notes (Signed)
ANTICOAGULATION CONSULT NOTE - Initial Consult  Pharmacy Consult for warfarin Indication: atrial fibrillation  Allergies  Allergen Reactions  . Sulfa Antibiotics Other (See Comments)    unknown  . Coreg [Carvedilol] Rash    Hair loss  . Zinc Rash   Patient Measurements: Height: 5' (152.4 cm) Weight: 248 lb 12.8 oz (112.9 kg) (scale c) IBW/kg (Calculated) : 45.5  Vital Signs: Temp: 98.7 F (37.1 C) (09/21 0459) Temp Source: Oral (09/21 0459) BP: 150/73 (09/21 0801) Pulse Rate: 61 (09/21 0801)  Labs:  Recent Labs  11/09/16 0231  11/10/16 0247 11/10/16 1155 11/10/16 1902 11/11/16 0418  HGB 9.8*  --  10.7*  --   --  10.8*  HCT 33.2*  --  36.3  --   --  36.0  PLT 276  --  291  --   --  316  LABPROT  --   --  18.0*  --   --  16.1*  INR  --   --  1.50  --   --  1.30  CREATININE 1.93*  < >  --  1.86* 1.93* 1.77*  < > = values in this interval not displayed. Estimated Creatinine Clearance: 30.5 mL/min (A) (by C-G formula based on SCr of 1.77 mg/dL (H)).  Medical History: Past Medical History:  Diagnosis Date  . CHF (congestive heart failure) (HCC)   . Diabetes mellitus without complication (HCC)   . Hypertension   . Kidney disease   . Renal disorder   . Shingles    Medications:  Prescriptions Prior to Admission  Medication Sig Dispense Refill Last Dose  . acetaminophen (TYLENOL) 325 MG tablet Take 2 tablets (650 mg total) by mouth every 4 (four) hours as needed for mild pain, moderate pain, fever or headache.   unknown at PRN  . aspirin EC 81 MG tablet Take 81 mg by mouth daily.   11/04/2016 at Unknown time  . carvedilol (COREG) 3.125 MG tablet Take 3.125 mg by mouth 2 (two) times daily.  0 11/04/2016 at 0600  . docusate sodium (COLACE) 100 MG capsule Take 100 mg by mouth 2 (two) times daily as needed for constipation.   UNKNOWN at prn  . furosemide (LASIX) 80 MG tablet Take 1.5 tablets (120 mg total) by mouth 2 (two) times daily. (Patient taking differently: Take 160  mg by mouth 2 (two) times daily. )   11/04/2016 at Unknown time  . glimepiride (AMARYL) 4 MG tablet Take 4 mg by mouth daily.  1 11/04/2016 at Unknown time  . hydrALAZINE (APRESOLINE) 25 MG tablet Take 25 mg by mouth 3 (three) times daily.  0 11/04/2016 at Unknown time  . pioglitazone (ACTOS) 45 MG tablet Take 45 mg by mouth daily.  0 11/04/2016 at Unknown time  . VENTOLIN HFA 108 (90 Base) MCG/ACT inhaler Inhale 2 puffs into the lungs daily.  1 11/04/2016 at Unknown time    Assessment: Sue Roach a 76 y.o.female with CKD found to have atrial fibrillation. Initially started on Eliquis, but patient cannot afford long term and asked to transition to warfarin. Lovenox will be used until INR therapeutic. Last dose of Eliquis ~1000 on 9/19.  INR changed from 1.5 to 1.3 today. Possible impact on baseline INR from apixaban given reduced CrCl. Received second dose of 5 mg warfarin last night. Anticipate starting to see full warfarin effects starting tomorrow. CBC stable. No signs/symptoms of bleeding noted in chart.  On concurrent doxycycline (scheduled to stop tomorrow) and methylprednisolone (dose reduced  yesterday), which could impact warfarin sensitivity.   Goal of Therapy:  INR 2-3 Monitor platelets by anticoagulation protocol: Yes   Plan:  Warfarin  x1 tonight Lovenox  once daily Monitor renal function, INR, CBC and for s/sx of bleeding  Girard Cooter, PharmD Clinical Pharmacist  Phone: [phone] 11/11/2016,8:33 AM

## 2016-11-11 NOTE — Progress Notes (Signed)
Corine Shelter returned page, he will discuss with Dr. Rennis Golden d/cing zebeta.

## 2016-11-11 NOTE — Progress Notes (Signed)
PROGRESS NOTE    Sue Roach  WGN:[id number] DOB: June 16, 1940 DOA: 11/04/2016 PCP: Kaleen Mask, MD    Brief Narrative: 76 y.o.femalewith medical history significant for obesity, chronic right-sided heart failure on diuretics with pulmonary HTN, DM 2on oral agents, chronic lower extremity stasis dermatitis, CKD 4,chronic anemia and hypertension. Patient reports that 3 days ago she developed a scratchy, sore throat with a headache and persistent coughing of clear mucoid sputum. No fevers no chills. No significant change in chronic lower extremity edema. She did use a prescribed cream on her left lower extremity yesterday but her skin began to burn and she removed the cream and noticed a residual redness/erythema after removal of the cream. She went to her PCP office today for she was found to be hypoxemic. She does not wear chronic oxygen. Upon presentation to the ER on room air her saturations were 60%. Chest x-ray revealed early edema without effusion and cardiomegaly.   She was also found to be in Atrial Flutter. Patient was admitted to SDU and Cardiology was consulted. Patient was placed on Anticoagulation and also placed on a Lasix gtt. She had severe wheezing but was refusing to go on BiPAP. She decompensated and was very Lethargic and found to be Hypercapnic with CO2 Narcosis so a Rapid was called but I was never notified by Nursing on 11/05/16. Dr. Royann Shivers called me to tell me what happened as he was in the Room when events took place and he consulted Dr. Isaiah Serge of PCCM.   PCCM evaluated and convinced patient to wear BiPAP and patient wore BiPAP intermittently until Saturations started dropping so then she agreed to go back on BiPAP. Patient ultimately refused BiPAP again. Cardiology evaluated and D/C'd Lasix gtt as Cr was rising; Cardiology worried about Cardiorenal but ECHO was normal. They may consider Iontropic Assistance for Diuresis. Nephrology was consulted for  further evaluation and recommendations .  Patient co being light headed and feels like passing out.heart rate last nite dropped to 40s.   Assessment & Plan:   Principal Problem:   Acute on chronic right-sided congestive heart failure (HCC) Active Problems:   Diabetes mellitus without complication (HCC)   Hypertension, uncontrolled   Hypothyroidism, adult   Acute respiratory failure with hypoxemia (HCC)   CKD (chronic kidney disease) stage 4, GFR 15-29 ml/min (HCC)   Chronic anemia   Atrial flutter (HCC)  CHF responding to high dose lasix. Aflutter/brady ekg.dc beta blocker.cards following.continue coumadin per pharmacy. CKD renal function stable.  DVT prophylaxis: coumadin Code Status: full Family Communication:  Disposition Plantbd   Consultants:card,nephro  Procedures:  Antimicrobials: doxy done today   Subjective:co light headed  Objective: Vitals:   11/11/16 0801 11/11/16 0846 11/11/16 1137 11/11/16 1146  BP: (!) 150/73 (!) 138/55 (!) 154/75 (!) 154/75  Pulse: 61 (!) 56 (!) 58 (!) 58  Resp:  Temp:  97.6 F (36.4 C) (!) 97.5 F (36.4 C) (!) 97.5 F (36.4 C)  TempSrc:  Oral Oral Oral  SpO2:  97% 100% 100%  Weight:      Height:        Intake/Output Summary (Last 24 hours) at 11/11/16 1227 Last data filed at 11/11/16 1100  Gross per 24 hour  Intake              954 ml  Output             2775 ml  Net            -  1821 ml   Filed Weights   11/09/16 0417 11/10/16 0540 11/11/16 0543  Weight: 119.4 kg (263 lb 4.8 oz) 115.7 kg (255 lb) 112.9 kg (248 lb 12.8 oz)    Examination:  General exam: Appears calm and comfortable  Respiratory system: Clear to auscultation. Respiratory effort normal. Cardiovascular system: S1 & S2 heard, RRR. No JVD, murmurs, rubs, gallops or clicks. No pedal edema. Gastrointestinal system: Abdomen is nondistended, soft and nontender. No organomegaly or masses felt. Normal bowel sounds heard. Central nervous system:  Alert and oriented. No focal neurological deficits. Extremities: Symmetric 5 x 5 power. Skin: No rashes, lesions or ulcers Psychiatry: Judgement and insight appear normal. Mood & affect appropriate.     Data Reviewed: I have personally reviewed following labs and imaging studies  CBC:  Recent Labs Lab 11/04/16 1315 11/06/16 0340 11/07/16 0238 11/08/16 0403 11/09/16 0231 11/10/16 0247 11/11/16 0418  WBC 7.1 6.3 9.2 9.0 7.9 8.8 8.5  NEUTROABS 5.0 5.6 8.3* 8.1* 7.0  --   --   HGB 10.1* 10.7* 9.9* 9.6* 9.8* 10.7* 10.8*  HCT 32.8* 36.6 33.5* 32.4* 33.2* 36.3 36.0  MCV 86.8 89.3 87.5 87.3 87.6 87.1 87.0  PLT 240 281 280 274 276 291 316   Basic Metabolic Panel:  Recent Labs Lab 11/05/16 0216 11/06/16 0340 11/07/16 0238 11/08/16 0403 11/09/16 0231 11/09/16 1836 11/10/16 1155 11/10/16 1902 11/11/16 0418  NA 139 136 134* 134* 136 136 139 136 139  K 3.6 4.8 5.3* 5.1 5.1 4.9 4.3 5.3* 4.6  CL 100* 100* 100* 100* 102 99* 96* 94* 96*  CO2 30 25 30 24 28 28 31 30 32   GLUCOSE 108* 121* 187* 174* 136* 212* 197* 313* 146*  BUN 45* 56* 81* 97* 100* 99* 99* 105* 105*  CREATININE 1.93* 2.11* 2.35* 2.20* 1.93* 2.01* 1.86* 1.93* 1.77*  CALCIUM 8.4* 8.5* 8.5* 8.3* 8.5* 8.3* 8.5* 8.3* 8.2*  MG 2.5* 2.6* 2.5* 2.6* 2.7*  --   --   --   --   PHOS  --  4.9* 4.6 5.1* 4.0  --   --   --   --    GFR: Estimated Creatinine Clearance: 30.5 mL/min (A) (by C-G formula based on SCr of 1.77 mg/dL (H)). Liver Function Tests:  Recent Labs Lab 11/06/16 0340 11/07/16 0238 11/08/16 0403 11/09/16 0231  AST 26 27 34 27  ALT 21 25 40 44  ALKPHOS 96 88 78 77  BILITOT 1.0 0.7 0.7 0.9  PROT 7.6 6.9 6.8 6.6  ALBUMIN 3.2* 3.0* 3.0* 3.1*   No results for input(s): LIPASE, AMYLASE in the last 168 hours. No results for input(s): AMMONIA in the last 168 hours. Coagulation Profile:  Recent Labs Lab 11/10/16 0247 11/11/16 0418  INR 1.50 1.30   Cardiac Enzymes:  Recent Labs Lab 11/04/16 1412  11/04/16 2042 11/05/16 0216 11/05/16 1009  TROPONINI 0.06* 0.06* 0.06* 0.05*   BNP (last 3 results) No results for input(s): PROBNP in the last 8760 hours. HbA1C: No results for input(s): HGBA1C in the last 72 hours. CBG:  Recent Labs Lab 11/10/16 1203 11/10/16 1645 11/10/16 2125 11/11/16 0717 11/11/16 1135  GLUCAP 184* 200* 251* 129* 137*   Lipid Profile: No results for input(s): CHOL, HDL, LDLCALC, TRIG, CHOLHDL, LDLDIRECT in the last 72 hours. Thyroid Function Tests: No results for input(s): TSH, T4TOTAL, FREET4, T3FREE, THYROIDAB in the last 72 hours. Anemia Panel: No results for input(s): VITAMINB12, FOLATE, FERRITIN, TIBC, IRON, RETICCTPCT in the last 72 hours. Sepsis  Labs: No results for input(s): PROCALCITON, LATICACIDVEN in the last 168 hours.  Recent Results (from the past 240 hour(s))  MRSA PCR Screening     Status: None   Collection Time: 11/04/16 10:09 PM  Result Value Ref Range Status   MRSA by PCR NEGATIVE NEGATIVE Final    Comment:        The GeneXpert MRSA Assay (FDA approved for NASAL specimens only), is one component of a comprehensive MRSA colonization surveillance program. It is not intended to diagnose MRSA infection nor to guide or monitor treatment for MRSA infections.   Respiratory Panel by PCR     Status: Abnormal   Collection Time: 11/05/16  3:50 AM  Result Value Ref Range Status   Adenovirus NOT DETECTED NOT DETECTED Final   Coronavirus 229E NOT DETECTED NOT DETECTED Final   Coronavirus HKU1 NOT DETECTED NOT DETECTED Final   Coronavirus NL63 NOT DETECTED NOT DETECTED Final   Coronavirus OC43 NOT DETECTED NOT DETECTED Final   Metapneumovirus NOT DETECTED NOT DETECTED Final   Rhinovirus / Enterovirus DETECTED (A) NOT DETECTED Final   Influenza A NOT DETECTED NOT DETECTED Final   Influenza A H1 NOT DETECTED NOT DETECTED Final   Influenza A H1 2009 NOT DETECTED NOT DETECTED Final   Influenza A H3 NOT DETECTED NOT DETECTED Final    Influenza B NOT DETECTED NOT DETECTED Final   Parainfluenza Virus 1 NOT DETECTED NOT DETECTED Final   Parainfluenza Virus 2 NOT DETECTED NOT DETECTED Final   Parainfluenza Virus 3 NOT DETECTED NOT DETECTED Final   Parainfluenza Virus 4 NOT DETECTED NOT DETECTED Final   Respiratory Syncytial Virus NOT DETECTED NOT DETECTED Final   Bordetella pertussis NOT DETECTED NOT DETECTED Final   Chlamydophila pneumoniae NOT DETECTED NOT DETECTED Final   Mycoplasma pneumoniae NOT DETECTED NOT DETECTED Final         Radiology Studies: No results found.      Scheduled Meds: . benzonatate  100 mg Oral BID  . clotrimazole   Topical BID  . dextromethorphan  15 mg Oral BID  . doxycycline  100 mg Oral Q12H  . enoxaparin (LOVENOX) injection  120 mg Subcutaneous Q24H  . guaiFENesin  1,200 mg Oral BID  . hydrALAZINE  25 mg Oral TID  . Influenza vac split quadrivalent PF  0.5 mL Intramuscular Tomorrow-1000  . insulin aspart  0-5 Units Subcutaneous QHS  . insulin aspart  0-9 Units Subcutaneous TID WC  . insulin glargine  12 Units Subcutaneous QHS  . levothyroxine  25 mcg Oral QAC breakfast  . methylPREDNISolone (SOLU-MEDROL) injection  40 mg Intravenous Daily  . sodium chloride flush  3 mL Intravenous Q12H  . warfarin  5 mg Oral ONCE-1800  . warfarin   Does not apply Once  . Warfarin - Pharmacist Dosing Inpatient   Does not apply q1800   Continuous Infusions: . sodium chloride    . furosemide       LOS: 6 days       Alwyn Ren, MD [facility]  If 7PM-7AM, please contact night-coverage [email or web address] Password [facility] 11/11/2016, 12:27 PM

## 2016-11-11 NOTE — Progress Notes (Signed)
Admit: 11/04/2016 LOS: 6  39F with AoCKD3 and Right sided sCHF exacerbation with dyspnea, cough, hypervolemia, likely exacerbated by COPD + untreated OSA  Subjective:  Further great UOP yesterda; downtrending weights Renal function very stable  09/20 0701 - 09/21 0700 In: 1164 [P.O.:1040; IV Piggyback:124] Out: 3250 [Urine:3250]  Filed Weights   11/09/16 0417 11/10/16 0540 11/11/16 0543  Weight: 119.4 kg (263 lb 4.8 oz) 115.7 kg (255 lb) 112.9 kg (248 lb 12.8 oz)    Scheduled Meds: . benzonatate  100 mg Oral BID  . clotrimazole   Topical BID  . dextromethorphan  15 mg Oral BID  . doxycycline  100 mg Oral Q12H  . enoxaparin (LOVENOX) injection  120 mg Subcutaneous Q24H  . guaiFENesin  1,200 mg Oral BID  . hydrALAZINE  25 mg Oral TID  . Influenza vac split quadrivalent PF  0.5 mL Intramuscular Tomorrow-1000  . insulin aspart  0-5 Units Subcutaneous QHS  . insulin aspart  0-9 Units Subcutaneous TID WC  . insulin glargine  12 Units Subcutaneous QHS  . levothyroxine  25 mcg Oral QAC breakfast  . methylPREDNISolone (SOLU-MEDROL) injection  40 mg Intravenous Daily  . sodium chloride flush  3 mL Intravenous Q12H  . warfarin  5 mg Oral ONCE-1800  . warfarin   Does not apply Once  . Warfarin - Pharmacist Dosing Inpatient   Does not apply q1800   Continuous Infusions: . sodium chloride    . furosemide     PRN Meds:.sodium chloride, acetaminophen, camphor-menthol, HYDROcodone-homatropine, ipratropium, levalbuterol, ondansetron (ZOFRAN) IV, sodium chloride flush  Current Labs: reviewed    Physical Exam:  Blood pressure (!) 154/75, pulse (!) 58, temperature (!) 97.5 F (36.4 C), temperature source Oral, resp. rate 18, height 5' (1.524 m), weight 112.9 kg (248 lb 12.8 oz), SpO2 100 %. GEN: Chronically ill-appearing, sitting in chair, using nasal cannula  ENT: NCAT  EYES: EOMI  CV: Regular, no murmurs  PULM: Diminished breath sounds in the bases especially but throughout,  ABD:  Soft, nontender  SKIN: No rashes or lesions  EXT:4+ edema in both legs into the knees  A 1. AoCKD3, related to decompensated R sided sCHF + diuretics; improving/resolved 2. R sided CHF exacerbation; hypervolemia improving 3. Presumed OSA, untreated 4. 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 Ongoing tobacco user 6. DM2 7. Hypothyroidism 8. HTN 9. AFlutter to rec warfarin 10. Anemia  P 1. Lasix to IV BID for now 2. Cont BID BMP 3. Continue fluid resection 4. Likely transition over to PO lasix in next 24-48h  Sue Heck MD 11/11/2016, 12:18 PM   Recent Labs Lab 11/07/16 0238 11/08/16 0403 11/09/16 0231  11/10/16 1155 11/10/16 1902 11/11/16 0418  NA 134* 134* 136  < > 139 136 139  K 5.3* 5.1 5.1  < > 4.3 5.3* 4.6  CL 100* 100* 102  < > 96* 94* 96*  CO2 < > 31 30 32  GLUCOSE 187* 174* 136*  < > 197* 313* 146*  BUN 81* 97* 100*  < > 99* 105* 105*  CREATININE 2.35* 2.20* 1.93*  < > 1.86* 1.93* 1.77*  CALCIUM 8.5* 8.3* 8.5*  < > 8.5* 8.3* 8.2*  PHOS 4.6 5.1* 4.0  --   --   --   --   < > = values in this interval not displayed.  Recent Labs Lab 11/07/16 0238 11/08/16 0403 11/09/16 0231 11/10/16 0247 11/11/16 0418  WBC 9.2 9.0 7.9 8.8 8.5  NEUTROABS 8.3*  8.1* 7.0  --   --   HGB 9.9* 9.6* 9.8* 10.7* 10.8*  HCT 33.5* 32.4* 33.2* 36.3 36.0  MCV 87.5 87.3 87.6 87.1 87.0  PLT 280 274 276 291 316

## 2016-11-11 NOTE — Progress Notes (Signed)
Pt's HR will get in the 40's-50's while resting at times, MD notified, thanks Lavonda Jumbo RN.

## 2016-11-11 NOTE — Progress Notes (Signed)
DAILY PROGRESS NOTE   Patient Name: Sue Roach Date of Encounter: 11/11/2016  Hospital Problem List   Principal Problem:   Acute on chronic right-sided congestive heart failure [facility]) Active Problems:   Diabetes mellitus without complication (HCC)   Hypertension, uncontrolled   Hypothyroidism, adult   Acute respiratory failure with hypoxemia (HCC)   CKD (chronic kidney disease) stage 4, GFR 15-29 ml/min (HCC)   Chronic anemia   Atrial flutter [facility])    Chief Complaint   Still has cough-overall she feels a little better  Subjective   Appreciate nephrology recs. Excellent urine output overnight with TID lasix. Now 6.5L negative. Creatinine stable. Weight down from 263 peak to 248 today.   Objective   Vitals:   11/11/16 0543 11/11/16 0700 11/11/16 0801 11/11/16 0846  BP:   (!) 150/73 (!) 138/55  Pulse:  60 61 (!) 56  Resp:    18  Temp:    97.6 F (36.4 C)  TempSrc:    Oral  SpO2: 94%   97%  Weight: 248 lb 12.8 oz (112.9 kg)     Height:        Intake/Output Summary (Last 24 hours) at 11/11/16 1045 Last data filed at 11/11/16 1000  Gross per 24 hour  Intake              954 ml  Output             3550 ml  Net            -2596 ml   Filed Weights   11/09/16 0417 11/10/16 0540 11/11/16 0543  Weight: 263 lb 4.8 oz (119.4 kg) 255 lb (115.7 kg) 248 lb 12.8 oz (112.9 kg)    Physical Exam   General appearance: alert, appears older than stated age, no distress and morbidly obese, on O2 Neck: JVD - 2 cm above sternal notch, no carotid bruit and thyroid not enlarged, symmetric, no tenderness/mass/nodules Lungs: diminished breath sounds bilaterally, Lt > Rt Heart: irregularly irregular rhythm Abdomen: protuberant, firm Extremities: edema 1+ woody stasis edema and venous stasis dermatitis noted Pulses: 2+ and symmetric Skin: Skin color, texture, turgor normal. No rashes or lesions Neurologic: Mental status: Alert, oriented, thought content appropriate Psych:  Pleasant  Inpatient Medications    Scheduled Meds: . aspirin EC  81 mg Oral Daily  . benzonatate  100 mg Oral BID  . bisoprolol  10 mg Oral Daily  . clotrimazole   Topical BID  . dextromethorphan  15 mg Oral BID  . doxycycline  100 mg Oral Q12H  . enoxaparin (LOVENOX) injection  120 mg Subcutaneous Q24H  . guaiFENesin  1,200 mg Oral BID  . hydrALAZINE  25 mg Oral TID  . Influenza vac split quadrivalent PF  0.5 mL Intramuscular Tomorrow-1000  . insulin aspart  0-5 Units Subcutaneous QHS  . insulin aspart  0-9 Units Subcutaneous TID WC  . insulin glargine  12 Units Subcutaneous QHS  . levothyroxine  25 mcg Oral QAC breakfast  . methylPREDNISolone (SOLU-MEDROL) injection  40 mg Intravenous Daily  . sodium chloride flush  3 mL Intravenous Q12H  . warfarin  5 mg Oral ONCE-1800  . warfarin   Does not apply Once  . Warfarin - Pharmacist Dosing Inpatient   Does not apply q1800    Continuous Infusions: . sodium chloride    . furosemide Stopped (11/11/16 0804)    PRN Meds: sodium chloride, acetaminophen, camphor-menthol, HYDROcodone-homatropine, ipratropium, levalbuterol, ondansetron (ZOFRAN) IV, sodium chloride flush  Labs   Results for orders placed or performed during the hospital encounter of 11/04/16 (from the past 48 hour(s))  Glucose, capillary     Status: Abnormal   Collection Time: 11/09/16 11:28 AM  Result Value Ref Range   Glucose-Capillary 277 (H) 65 - 99 mg/dL   Comment 1 Notify RN    Comment 2 Document in Chart   Glucose, capillary     Status: Abnormal   Collection Time: 11/09/16  5:45 PM  Result Value Ref Range   Glucose-Capillary 194 (H) 65 - 99 mg/dL  Basic metabolic panel     Status: Abnormal   Collection Time: 11/09/16  6:36 PM  Result Value Ref Range   Sodium 136 135 - 145 mmol/L   Potassium 4.9 3.5 - 5.1 mmol/L    Comment: SLIGHT HEMOLYSIS   Chloride 99 (L) 101 - 111 mmol/L   CO2 28 22 - 32 mmol/L   Glucose, Bld 212 (H) 65 - 99 mg/dL   BUN 99 (H) 6  - 20 mg/dL   Creatinine, Ser 2.01 (H) 0.44 - 1.00 mg/dL   Calcium 8.3 (L) 8.9 - 10.3 mg/dL   GFR calc non Af Amer 23 (L) >60 mL/min   GFR calc Af Amer 26 (L) >60 mL/min    Comment: (NOTE) The eGFR has been calculated using the CKD EPI equation. This calculation has not been validated in all clinical situations. eGFR's persistently <60 mL/min signify possible Chronic Kidney Disease.    Anion gap 9 5 - 15  Glucose, capillary     Status: Abnormal   Collection Time: 11/09/16  9:20 PM  Result Value Ref Range   Glucose-Capillary 142 (H) 65 - 99 mg/dL  Protime-INR     Status: Abnormal   Collection Time: 11/10/16  2:47 AM  Result Value Ref Range   Prothrombin Time 18.0 (H) 11.4 - 15.2 seconds   INR 1.50   CBC     Status: Abnormal   Collection Time: 11/10/16  2:47 AM  Result Value Ref Range   WBC 8.8 4.0 - 10.5 K/uL   RBC 4.17 3.87 - 5.11 MIL/uL   Hemoglobin 10.7 (L) 12.0 - 15.0 g/dL   HCT 36.3 36.0 - 46.0 %   MCV 87.1 78.0 - 100.0 fL   MCH 25.7 (L) 26.0 - 34.0 pg   MCHC 29.5 (L) 30.0 - 36.0 g/dL   RDW 18.4 (H) 11.5 - 15.5 %   Platelets 291 150 - 400 K/uL  Glucose, capillary     Status: Abnormal   Collection Time: 11/10/16  7:57 AM  Result Value Ref Range   Glucose-Capillary 194 (H) 65 - 99 mg/dL   Comment 1 Notify RN   Basic metabolic panel     Status: Abnormal   Collection Time: 11/10/16 11:55 AM  Result Value Ref Range   Sodium 139 135 - 145 mmol/L   Potassium 4.3 3.5 - 5.1 mmol/L   Chloride 96 (L) 101 - 111 mmol/L   CO2 31 22 - 32 mmol/L   Glucose, Bld 197 (H) 65 - 99 mg/dL   BUN 99 (H) 6 - 20 mg/dL   Creatinine, Ser 1.86 (H) 0.44 - 1.00 mg/dL   Calcium 8.5 (L) 8.9 - 10.3 mg/dL   GFR calc non Af Amer 25 (L) >60 mL/min   GFR calc Af Amer 29 (L) >60 mL/min    Comment: (NOTE) The eGFR has been calculated using the CKD EPI equation. This calculation has not been validated in all  clinical situations. eGFR's persistently <60 mL/min signify possible Chronic  Kidney Disease.    Anion gap 12 5 - 15  Glucose, capillary     Status: Abnormal   Collection Time: 11/10/16 12:03 PM  Result Value Ref Range   Glucose-Capillary 184 (H) 65 - 99 mg/dL   Comment 1 Notify RN   Glucose, capillary     Status: Abnormal   Collection Time: 11/10/16  4:45 PM  Result Value Ref Range   Glucose-Capillary 200 (H) 65 - 99 mg/dL   Comment 1 Notify RN   Basic metabolic panel     Status: Abnormal   Collection Time: 11/10/16  7:02 PM  Result Value Ref Range   Sodium 136 135 - 145 mmol/L   Potassium 5.3 (H) 3.5 - 5.1 mmol/L    Comment: DELTA CHECK NOTED   Chloride 94 (L) 101 - 111 mmol/L   CO2 30 22 - 32 mmol/L   Glucose, Bld 313 (H) 65 - 99 mg/dL   BUN 105 (H) 6 - 20 mg/dL   Creatinine, Ser 1.93 (H) 0.44 - 1.00 mg/dL   Calcium 8.3 (L) 8.9 - 10.3 mg/dL   GFR calc non Af Amer 24 (L) >60 mL/min   GFR calc Af Amer 28 (L) >60 mL/min    Comment: (NOTE) The eGFR has been calculated using the CKD EPI equation. This calculation has not been validated in all clinical situations. eGFR's persistently <60 mL/min signify possible Chronic Kidney Disease.    Anion gap 12 5 - 15  Glucose, capillary     Status: Abnormal   Collection Time: 11/10/16  9:25 PM  Result Value Ref Range   Glucose-Capillary 251 (H) 65 - 99 mg/dL   Comment 1 Notify RN    Comment 2 Document in Chart   Protime-INR     Status: Abnormal   Collection Time: 11/11/16  4:18 AM  Result Value Ref Range   Prothrombin Time 16.1 (H) 11.4 - 15.2 seconds   INR 1.30   CBC     Status: Abnormal   Collection Time: 11/11/16  4:18 AM  Result Value Ref Range   WBC 8.5 4.0 - 10.5 K/uL   RBC 4.14 3.87 - 5.11 MIL/uL   Hemoglobin 10.8 (L) 12.0 - 15.0 g/dL   HCT 36.0 36.0 - 46.0 %   MCV 87.0 78.0 - 100.0 fL   MCH 26.1 26.0 - 34.0 pg   MCHC 30.0 30.0 - 36.0 g/dL   RDW 18.7 (H) 11.5 - 15.5 %   Platelets 316 150 - 400 K/uL  Basic metabolic panel     Status: Abnormal   Collection Time: 11/11/16  4:18 AM  Result  Value Ref Range   Sodium 139 135 - 145 mmol/L   Potassium 4.6 3.5 - 5.1 mmol/L   Chloride 96 (L) 101 - 111 mmol/L   CO2 32 22 - 32 mmol/L   Glucose, Bld 146 (H) 65 - 99 mg/dL   BUN 105 (H) 6 - 20 mg/dL   Creatinine, Ser 1.77 (H) 0.44 - 1.00 mg/dL   Calcium 8.2 (L) 8.9 - 10.3 mg/dL   GFR calc non Af Amer 27 (L) >60 mL/min   GFR calc Af Amer 31 (L) >60 mL/min    Comment: (NOTE) The eGFR has been calculated using the CKD EPI equation. This calculation has not been validated in all clinical situations. eGFR's persistently <60 mL/min signify possible Chronic Kidney Disease.    Anion gap 11 5 - 15  Glucose,  capillary     Status: Abnormal   Collection Time: 11/11/16  7:17 AM  Result Value Ref Range   Glucose-Capillary 129 (H) 65 - 99 mg/dL   Comment 1 Notify RN    Comment 2 Document in Chart     ECG   N/A  Telemetry   Atrial flutter with slow VR - Personally Reviewed  Radiology    2V CXR 11/05/16- CHEST  2 VIEW  COMPARISON:  Chest x-ray dated November 04, 2016.  FINDINGS: Stable moderate cardiomegaly. Mild pulmonary vascular congestion. Mild basal predominant interstitial thickening, similar to prior study. Bibasilar atelectasis. No focal consolidation, pleural effusion, or pneumothorax. No acute osseous abnormality.  IMPRESSION: Stable cardiomegaly with mild interstitial edema.   Cardiac Studies   Echo 11/07/16- Study Conclusions  - Left ventricle: The cavity size was mildly dilated. Wall   thickness was normal. Systolic function was normal. The estimated   ejection fraction was in the range of 55% to 60%. Wall motion was   normal; there were no regional wall motion abnormalities. - Ventricular septum: The contour showed diastolic flattening and   systolic flattening. These changes are consistent with RV volume   and pressure overload. - Mitral valve: There was mild regurgitation. Valve area by   continuity equation (using LVOT flow): 2.43 cm^2. - Left  atrium: The atrium was severely dilated. - Right ventricle: The cavity size was moderately dilated. Systolic   function was moderately reduced. - Right atrium: The atrium was severely dilated. - Tricuspid valve: There was malcoaptation of the valve leaflets.   There was moderate-severe regurgitation directed centrally.   Diastolic regurgitation was present. - Pulmonary arteries: Systolic pressure was moderately increased.   PA peak pressure: 58 mm Hg (S).  Assessment   Principal Problem:   Acute on chronic right-sided congestive heart failure (HCC) Active Problems:   Diabetes mellitus without complication (HCC)   Hypertension, uncontrolled   Hypothyroidism, adult   Acute respiratory failure with hypoxemia (HCC)   CKD (chronic kidney disease) stage 4, GFR 15-29 ml/min (HCC)   Chronic anemia   Atrial flutter ([place])   Plan   1. Seems to be diuresing well on TID lasix -  renal has been following. Would continue diuresis. Discussed importance of anticoagulation (she will not be able to afford Eliquis- plan Coumadin). Will stop ASA.   2.    Called twice by RN secondary to slow HR- mainly when          sleeping. I suspect she has sleep apnea. For now will hold Zebeta.   Length of Stay:  LOS: 6 days   Kerin Ransom 11/11/2016, 10:45 AM

## 2016-11-12 LAB — BASIC METABOLIC PANEL
ANION GAP: 10 (ref 5–15)
Anion gap: 9 (ref 5–15)
BUN: 95 mg/dL — AB (ref 6–20)
BUN: 96 mg/dL — AB (ref 6–20)
CHLORIDE: 98 mmol/L — AB (ref 101–111)
CHLORIDE: 96 mmol/L — AB (ref 101–111)
CO2: 33 mmol/L — AB (ref 22–32)
CO2: 33 mmol/L — ABNORMAL HIGH (ref 22–32)
CREATININE: 1.66 mg/dL — AB (ref 0.44–1.00)
Calcium: 8.2 mg/dL — ABNORMAL LOW (ref 8.9–10.3)
Calcium: 8.4 mg/dL — ABNORMAL LOW (ref 8.9–10.3)
Creatinine, Ser: 1.72 mg/dL — ABNORMAL HIGH (ref 0.44–1.00)
GFR calc Af Amer: 33 mL/min — ABNORMAL LOW (ref >60)
GFR calc Af Amer: 32 mL/min — ABNORMAL LOW (ref >60)
GFR calc non Af Amer: 29 mL/min — ABNORMAL LOW (ref >60)
GFR, EST NON AFRICAN AMERICAN: 27 mL/min — AB (ref >60)
Glucose, Bld: 150 mg/dL — ABNORMAL HIGH (ref 65–99)
Glucose, Bld: 169 mg/dL — ABNORMAL HIGH (ref 65–99)
POTASSIUM: 4.5 mmol/L (ref 3.5–5.1)
POTASSIUM: 4.7 mmol/L (ref 3.5–5.1)
SODIUM: 140 mmol/L (ref 135–145)
SODIUM: 139 mmol/L (ref 135–145)

## 2016-11-12 LAB — GLUCOSE, CAPILLARY
GLUCOSE-CAPILLARY: 154 mg/dL — AB (ref 65–99)
GLUCOSE-CAPILLARY: 130 mg/dL — AB (ref 65–99)
GLUCOSE-CAPILLARY: 169 mg/dL — AB (ref 65–99)
GLUCOSE-CAPILLARY: 181 mg/dL — AB (ref 65–99)

## 2016-11-12 LAB — CBC
HEMATOCRIT: 36.4 % (ref 36.0–46.0)
HEMOGLOBIN: 10.8 g/dL — AB (ref 12.0–15.0)
MCH: 25.9 pg — AB (ref 26.0–34.0)
MCHC: 29.7 g/dL — AB (ref 30.0–36.0)
MCV: 87.3 fL (ref 78.0–100.0)
Platelets: 301 10*3/uL (ref 150–400)
RBC: 4.17 MIL/uL (ref 3.87–5.11)
RDW: 18.8 % — ABNORMAL HIGH (ref 11.5–15.5)
WBC: 7.5 10*3/uL (ref 4.0–10.5)

## 2016-11-12 LAB — PROTIME-INR
INR: 1.33
Prothrombin Time: 16.4 seconds — ABNORMAL HIGH (ref 11.4–15.2)

## 2016-11-12 MED ORDER — WARFARIN SODIUM 10 MG PO TABS
10.0000 mg | ORAL_TABLET | Freq: Once | ORAL | Status: AC
  Administered 2016-11-12: 10 mg via ORAL
  Filled 2016-11-12: qty 1

## 2016-11-12 NOTE — Evaluation (Signed)
Physical Therapy Evaluation Patient Details Name: Sue Roach MRN: [id number] DOB: 04/30/1940 Today's Date: 11/12/2016   History of Present Illness  76 y.o. female with DM, HTN, CKD, and R sided CHF    Clinical Impression  Pt admitted with above diagnosis. Pt currently with functional limitations due to the deficits listed below (see PT Problem List). Pt has struggled with dizziness since she arrived to hospital, do not see signs of vestibular disorder. Possibly related to medications? And she keeps taking her O2 off and sats drop to low 80's on RA. Encouraged to remain on O2. When she has on 3L O2, sats remain low 90's.  Pt will benefit from skilled PT to increase their independence and safety with mobility to allow discharge to the venue listed below.       Follow Up Recommendations Home health PT    Equipment Recommendations  None recommended by PT    Recommendations for Other Services OT consult     Precautions / Restrictions Precautions Precautions: Fall Restrictions Weight Bearing Restrictions: No      Mobility  Bed Mobility               General bed mobility comments: NT, pt does not like the hospital bed and has been staying in recliner with feet down. Does not want to put feet up because she reports that when she leans back she can't breathe  Transfers Overall transfer level: Needs assistance Equipment used: Rolling walker (2 wheeled) Transfers: Sit to/from Stand Sit to Stand: Min guard         General transfer comment: min-guard for safety. Pt does not trust RW so reaches for sink and other stable surfaces.   Ambulation/Gait Ambulation/Gait assistance: Min assist Ambulation Distance (Feet): 25 Feet Assistive device: Rolling walker (2 wheeled) Gait Pattern/deviations: Step-through pattern;Trunk flexed;Wide base of support Gait velocity: decreased Gait velocity interpretation: <1.8 ft/sec, indicative of risk for recurrent falls General Gait  Details: pt's dizziness has been better today than yesterday but after 10', she began to feel dizzy again so returned to chair. Also ambulated to and from bathroom. Pt wanted to ambulate without O2 but O2 sats 81% on RA. Remained 91% and above on 3L O2  Stairs            Wheelchair Mobility    Modified Rankin (Stroke Patients Only)       Balance Overall balance assessment: Needs assistance Sitting-balance support: No upper extremity supported Sitting balance-Leahy Scale: Fair     Standing balance support: Single extremity supported Standing balance-Leahy Scale: Poor Standing balance comment: unsteady without UE support                             Pertinent Vitals/Pain Pain Assessment: No/denies pain    Home Living Family/patient expects to be discharged to:: Private residence Living Arrangements: Alone Available Help at Discharge: Family;Available PRN/intermittently Type of Home: House Home Access: Level entry     Home Layout: Two level;Bed/bath upstairs;1/2 bath on main level Home Equipment: Walker - 2 wheels;Cane - single point;Bedside commode;Tub bench Additional Comments: difficult to follow pt's history at times, her timeline seems somewhat confused    Prior Function Level of Independence: Independent with assistive device(s)         Comments: ambulates with RW, she reports that she drives     Hand Dominance        Extremity/Trunk Assessment   Upper Extremity Assessment Upper Extremity Assessment:  Overall [facility] for tasks assessed    Lower Extremity Assessment Lower Extremity Assessment: Generalized weakness    Cervical / Trunk Assessment Cervical / Trunk Assessment: Normal  Communication   Communication: No difficulties  Cognition Arousal/Alertness: Awake/alert Behavior During Therapy: WFL for tasks assessed/performed Overall Cognitive Status: No family/caregiver present to determine baseline cognitive functioning                                         General Comments General comments (skin integrity, edema, etc.): Pt reports that she was not dizzy before coming to hospital and she feels that it is the cardiac meds that are causing her dizziness. Though she keeps taking off her O2 and sats drop to low 80's. She reports that she does not have O2 at home and does not want to get dependent on it    Exercises     Assessment/Plan    PT Assessment Patient needs continued PT services  PT Problem List Decreased strength;Decreased activity tolerance;Decreased balance;Decreased mobility;Cardiopulmonary status limiting activity;Decreased knowledge of precautions;Obesity       PT Treatment Interventions DME instruction;Gait training;Stair training;Functional mobility training;Therapeutic activities;Therapeutic exercise;Balance training;Patient/family education    PT Goals (Current goals can be found in the Care Plan section)  Acute Rehab PT Goals Patient Stated Goal: get off meds and go home PT Goal Formulation: With patient Time For Goal Achievement: 11/26/16 Potential to Achieve Goals: Fair    Frequency Min 3X/week   Barriers to discharge Decreased caregiver support alone    Co-evaluation               AM-PAC PT "6 Clicks" Daily Activity  Outcome Measure Difficulty turning over in bed (including adjusting bedclothes, sheets and blankets)?: A Little Difficulty moving from lying on back to sitting on the side of the bed? : A Little Difficulty sitting down on and standing up from a chair with arms (e.g., wheelchair, bedside commode, etc,.)?: A Little Help needed moving to and from a bed to chair (including a wheelchair)?: A Little Help needed walking in hospital room?: A Little Help needed climbing 3-5 steps with a railing? : A Lot 6 Click Score: 17    End of Session Equipment Utilized During Treatment: Gait belt Activity Tolerance: Patient limited by fatigue Patient left: in chair;with  call bell/phone within reach Nurse Communication: Mobility status PT Visit Diagnosis: Dizziness and giddiness (R42);Difficulty in walking, not elsewhere classified (R26.2);Muscle weakness (generalized) (M62.81)    Time: 1132-1208 PT Time Calculation (min) (ACUTE ONLY): 36 min   Charges:   PT Evaluation $PT Eval Moderate Complexity: 1 Mod PT Treatments $Gait Training: 8-22 mins   PT G Codes:        Lyanne Co, PT  Acute Rehab Services  [phone]   [place] L Castor Gittleman 11/12/2016, 2:22 PM

## 2016-11-12 NOTE — Progress Notes (Addendum)
Pt stable during 7am to 7pm shift, no any complications and complain of chest pain noted, pt was out of bed most of the time sitting in the recliner, is in oxygen at 2l/min, is in lasix, diuresing well. will continue to monitor

## 2016-11-12 NOTE — Progress Notes (Signed)
Progress Note  Patient Name: Sue Roach Date of Encounter: 11/12/2016  Primary Cardiologist:   Subjective   Breathing is fair  No CP    Inpatient Medications    Scheduled Meds: . benzonatate  100 mg Oral BID  . clotrimazole   Topical BID  . dextromethorphan  15 mg Oral BID  . enoxaparin (LOVENOX) injection  120 mg Subcutaneous Q24H  . guaiFENesin  1,200 mg Oral BID  . hydrALAZINE  25 mg Oral TID  . Influenza vac split quadrivalent PF  0.5 mL Intramuscular Tomorrow-1000  . insulin aspart  0-5 Units Subcutaneous QHS  . insulin aspart  0-9 Units Subcutaneous TID WC  . insulin glargine  12 Units Subcutaneous QHS  . levothyroxine  25 mcg Oral QAC breakfast  . methylPREDNISolone (SOLU-MEDROL) injection  40 mg Intravenous Daily  . sodium chloride flush  3 mL Intravenous Q12H  . warfarin   Does not apply Once  . Warfarin - Pharmacist Dosing Inpatient   Does not apply q1800   Continuous Infusions: . sodium chloride    . furosemide Stopped (11/12/16 1032)   PRN Meds: sodium chloride, acetaminophen, camphor-menthol, HYDROcodone-homatropine, ipratropium, levalbuterol, ondansetron (ZOFRAN) IV, sodium chloride flush   Vital Signs    Vitals:   11/11/16 1146 11/11/16 2104 11/12/16 0402 11/12/16 0931  BP: (!) 154/75 (!) 158/90 (!) 172/64 (!) 150/80  Pulse: (!) 58 (!) 53 (!) 59   Resp: Temp: (!) 97.5 F (36.4 C) (!) 97.5 F (36.4 C) (!) 97.4 F (36.3 C)   TempSrc: Oral Oral Oral   SpO2: 100% 95% 97%   Weight:   246 lb 7.6 oz (111.8 kg)   Height:        Intake/Output Summary (Last 24 hours) at 11/12/16 1301 Last data filed at 11/12/16 0012  Gross per 24 hour  Intake              222 ml  Output              950 ml  Net             -728 ml   Filed Weights   11/10/16 0540 11/11/16 0543 11/12/16 0402  Weight: 255 lb (115.7 kg) 248 lb 12.8 oz (112.9 kg) 246 lb 7.6 oz (111.8 kg)    Telemetry    afib 50s   - Personally Reviewed  ECG       Physical Exam   GEN: No acute distress.   Neck: Neck full   Cardiac: Irreg irreg  , no murmurs, rubs, or gallops.  Respiratory: Clear to auscultation bilaterally. GI: Soft, nontender, non-distended  MS: 1+ LE edema  L greater than R  Erythema  ; No deformity. Neuro:  Nonfocal  Psych: Normal affect   Labs    Chemistry Recent Labs Lab 11/07/16 0238 11/08/16 0403 11/09/16 0231  11/11/16 0418 11/11/16 1912 11/12/16 0515  NA 134* 134* 136  < > 139 138 140  K 5.3* 5.1 5.1  < > 4.6 4.9 4.5  CL 100* 100* 102  < > 96* 94* 98*  CO2 < > 32 33* 33*  GLUCOSE 187* 174* 136*  < > 146* 223* 150*  BUN 81* 97* 100*  < > 105* 100* 95*  CREATININE 2.35* 2.20* 1.93*  < > 1.77* 1.81* 1.66*  CALCIUM 8.5* 8.3* 8.5*  < > 8.2* 8.3* 8.2*  PROT 6.9 6.8 6.6  --   --   --   --  ALBUMIN 3.0* 3.0* 3.1*  --   --   --   --   AST 27 34 27  --   --   --   --   ALT 25 40 44  --   --   --   --   ALKPHOS 88 78 77  --   --   --   --   BILITOT 0.7 0.7 0.9  --   --   --   --   GFRNONAA 19* 20* 24*  < > 27* 26* 29*  GFRAA 22* 24* 28*  < > 31* 30* 33*  ANIONGAP 4* 10 6  < > < > = values in this interval not displayed.   Hematology Recent Labs Lab 11/10/16 0247 11/11/16 0418 11/12/16 0515  WBC 8.8 8.5 7.5  RBC 4.17 4.14 4.17  HGB 10.7* 10.8* 10.8*  HCT 36.3 36.0 36.4  MCV 87.1 87.0 87.3  MCH 25.7* 26.1 25.9*  MCHC 29.5* 30.0 29.7*  RDW 18.4* 18.7* 18.8*  PLT 291 316 301    Cardiac EnzymesNo results for input(s): TROPONINI in the last 168 hours. No results for input(s): TROPIPOC in the last 168 hours.   BNPNo results for input(s): BNP, PROBNP in the last 168 hours.   DDimer No results for input(s): DDIMER in the last 168 hours.   Radiology    No results found.  Cardiac Studies     Patient Profile     76 y.o. female with DM, HTN, CKD, and R sided CHF    Assessment & Plan    1  Acute on chronic right sided CHF  Continues to diurese  Volume status is up some   Renal following and directing doing  2  COPD  ON O2   Encouraged pt to use as sats on RA are below 90%    2  Atrila flutter  Rate control  Anticoag with coumadin    For questions or updates, please contact [facility] Please consult [email or web address] for contact info under Cardiology/STEMI.      Signed, Dietrich Pates, MD  11/12/2016, 1:01 PM

## 2016-11-12 NOTE — Progress Notes (Signed)
PROGRESS NOTE    Sue Roach  RUE:[id number] DOB: 04-22-1940 DOA: 11/04/2016 PCP: Kaleen Mask, MD    Brief Narrative76 y.o.femalewith medical history significant for obesity, chronic right-sided heart failure on diuretics with pulmonary HTN, DM 2on oral agents, chronic lower extremity stasis dermatitis, CKD 4,chronic anemia and hypertension. Patient reports that 3 days ago she developed a scratchy, sore throat with a headache and persistent coughing of clear mucoid sputum. No fevers no chills. No significant change in chronic lower extremity edema. She did use a prescribed cream on her left lower extremity yesterday but her skin began to burn and she removed the cream and noticed a residual redness/erythema after removal of the cream. She went to her PCP office today for she was found to be hypoxemic. She does not wear chronic oxygen. Upon presentation to the ER on room air her saturations were 60%. Chest x-ray revealed early edema without effusion and cardiomegaly. Patient continued to refuse bipap.she was tried on high dose lasix 120 mg tid with good response.lasix dose has been changed to bid.beta blocker was stopped due to bradycardia.   Assessment & Plan:   Principal Problem:   Acute on chronic right-sided congestive heart failure (HCC) Active Problems:   Diabetes mellitus without complication (HCC)   Hypertension, uncontrolled   Hypothyroidism, adult   Acute respiratory failure with hypoxemia (HCC)   CKD (chronic kidney disease) stage 4, GFR 15-29 ml/min (HCC)   Chronic anemia   Atrial flutter (HCC)  CHF responding to high dose lasix.dose decreased yesterday. Aflutter/brady ekg.dc beta blocker.cards following.continue coumadin per pharmacy. CKD renal function stable.  DVT prophylaxis:coumadin Code Status: full Family Communication:  Disposition Plan: tbd   Consultants: cardio.nephro.  Procedures:   Antimicrobials:  none  Subjective:feels  better  Objective: Vitals:   11/11/16 1146 11/11/16 2104 11/12/16 0402 11/12/16 0931  BP: (!) 154/75 (!) 158/90 (!) 172/64 (!) 150/80  Pulse: (!) 58 (!) 53 (!) 59   Resp: Temp: (!) 97.5 F (36.4 C) (!) 97.5 F (36.4 C) (!) 97.4 F (36.3 C)   TempSrc: Oral Oral Oral   SpO2: 100% 95% 97%   Weight:   111.8 kg (246 lb 7.6 oz)   Height:        Intake/Output Summary (Last 24 hours) at 11/12/16 1234 Last data filed at 11/12/16 0012  Gross per 24 hour  Intake              222 ml  Output              950 ml  Net             -728 ml   Filed Weights   11/10/16 0540 11/11/16 0543 11/12/16 0402  Weight: 115.7 kg (255 lb) 112.9 kg (248 lb 12.8 oz) 111.8 kg (246 lb 7.6 oz)    Examination:  General exam: Appears calm and comfortable  Respiratory system: Clear to auscultation. Respiratory effort normal. Cardiovascular system: S1 & S2 heard, RRR. No JVD, murmurs, rubs, gallops or clicks. No pedal edema. Gastrointestinal system: Abdomen is nondistended, soft and nontender. No organomegaly or masses felt. Normal bowel sounds heard. Central nervous system: Alert and oriented. No focal neurological deficits. Extremities: Symmetric 5 x 5 power. Skin: No rashes, lesions or ulcers Psychiatry: Judgement and insight appear normal. Mood & affect appropriate.     Data Reviewed: I have personally reviewed following labs and imaging studies  CBC:  Recent Labs Lab 11/06/16 0340 11/07/16 0238  11/08/16 0403 11/09/16 0231 11/10/16 0247 11/11/16 0418 11/12/16 0515  WBC 6.3 9.2 9.0 7.9 8.8 8.5 7.5  NEUTROABS 5.6 8.3* 8.1* 7.0  --   --   --   HGB 10.7* 9.9* 9.6* 9.8* 10.7* 10.8* 10.8*  HCT 36.6 33.5* 32.4* 33.2* 36.3 36.0 36.4  MCV 89.3 87.5 87.3 87.6 87.1 87.0 87.3  PLT 281 280 274 276 291 316 301   Basic Metabolic Panel:  Recent Labs Lab 11/06/16 0340 11/07/16 0238 11/08/16 0403 11/09/16 0231  11/10/16 1155 11/10/16 1902 11/11/16 0418 11/11/16 1912 11/12/16 0515  NA  136 134* 134* 136  < > 139 136 139 138 140  K 4.8 5.3* 5.1 5.1  < > 4.3 5.3* 4.6 4.9 4.5  CL 100* 100* 100* 102  < > 96* 94* 96* 94* 98*  CO2 < > 31 30 32 33* 33*  GLUCOSE 121* 187* 174* 136*  < > 197* 313* 146* 223* 150*  BUN 56* 81* 97* 100*  < > 99* 105* 105* 100* 95*  CREATININE 2.11* 2.35* 2.20* 1.93*  < > 1.86* 1.93* 1.77* 1.81* 1.66*  CALCIUM 8.5* 8.5* 8.3* 8.5*  < > 8.5* 8.3* 8.2* 8.3* 8.2*  MG 2.6* 2.5* 2.6* 2.7*  --   --   --   --   --   --   PHOS 4.9* 4.6 5.1* 4.0  --   --   --   --   --   --   < > = values in this interval not displayed. GFR: Estimated Creatinine Clearance: 32.3 mL/min (A) (by C-G formula based on SCr of 1.66 mg/dL (H)). Liver Function Tests:  Recent Labs Lab 11/06/16 0340 11/07/16 0238 11/08/16 0403 11/09/16 0231  AST 26 27 34 27  ALT 21 25 40 44  ALKPHOS 96 88 78 77  BILITOT 1.0 0.7 0.7 0.9  PROT 7.6 6.9 6.8 6.6  ALBUMIN 3.2* 3.0* 3.0* 3.1*   No results for input(s): LIPASE, AMYLASE in the last 168 hours. No results for input(s): AMMONIA in the last 168 hours. Coagulation Profile:  Recent Labs Lab 11/10/16 0247 11/11/16 0418 11/12/16 0515  INR 1.50 1.30 1.33   Cardiac Enzymes: No results for input(s): CKTOTAL, CKMB, CKMBINDEX, TROPONINI in the last 168 hours. BNP (last 3 results) No results for input(s): PROBNP in the last 8760 hours. HbA1C: No results for input(s): HGBA1C in the last 72 hours. CBG:  Recent Labs Lab 11/11/16 1135 11/11/16 1631 11/11/16 2107 11/12/16 0838 11/12/16 1232  GLUCAP 137* 174* 251* 154* 130*   Lipid Profile: No results for input(s): CHOL, HDL, LDLCALC, TRIG, CHOLHDL, LDLDIRECT in the last 72 hours. Thyroid Function Tests: No results for input(s): TSH, T4TOTAL, FREET4, T3FREE, THYROIDAB in the last 72 hours. Anemia Panel: No results for input(s): VITAMINB12, FOLATE, FERRITIN, TIBC, IRON, RETICCTPCT in the last 72 hours. Sepsis Labs: No results for input(s): PROCALCITON, LATICACIDVEN in  the last 168 hours.  Recent Results (from the past 240 hour(s))  MRSA PCR Screening     Status: None   Collection Time: 11/04/16 10:09 PM  Result Value Ref Range Status   MRSA by PCR NEGATIVE NEGATIVE Final    Comment:        The GeneXpert MRSA Assay (FDA approved for NASAL specimens only), is one component of a comprehensive MRSA colonization surveillance program. It is not intended to diagnose MRSA infection nor to guide or monitor treatment for MRSA infections.   Respiratory Panel by  PCR     Status: Abnormal   Collection Time: 11/05/16  3:50 AM  Result Value Ref Range Status   Adenovirus NOT DETECTED NOT DETECTED Final   Coronavirus 229E NOT DETECTED NOT DETECTED Final   Coronavirus HKU1 NOT DETECTED NOT DETECTED Final   Coronavirus NL63 NOT DETECTED NOT DETECTED Final   Coronavirus OC43 NOT DETECTED NOT DETECTED Final   Metapneumovirus NOT DETECTED NOT DETECTED Final   Rhinovirus / Enterovirus DETECTED (A) NOT DETECTED Final   Influenza A NOT DETECTED NOT DETECTED Final   Influenza A H1 NOT DETECTED NOT DETECTED Final   Influenza A H1 2009 NOT DETECTED NOT DETECTED Final   Influenza A H3 NOT DETECTED NOT DETECTED Final   Influenza B NOT DETECTED NOT DETECTED Final   Parainfluenza Virus 1 NOT DETECTED NOT DETECTED Final   Parainfluenza Virus 2 NOT DETECTED NOT DETECTED Final   Parainfluenza Virus 3 NOT DETECTED NOT DETECTED Final   Parainfluenza Virus 4 NOT DETECTED NOT DETECTED Final   Respiratory Syncytial Virus NOT DETECTED NOT DETECTED Final   Bordetella pertussis NOT DETECTED NOT DETECTED Final   Chlamydophila pneumoniae NOT DETECTED NOT DETECTED Final   Mycoplasma pneumoniae NOT DETECTED NOT DETECTED Final         Radiology Studies: No results found.      Scheduled Meds: . benzonatate  100 mg Oral BID  . clotrimazole   Topical BID  . dextromethorphan  15 mg Oral BID  . enoxaparin (LOVENOX) injection  120 mg Subcutaneous Q24H  . guaiFENesin  1,200 mg  Oral BID  . hydrALAZINE  25 mg Oral TID  . Influenza vac split quadrivalent PF  0.5 mL Intramuscular Tomorrow-1000  . insulin aspart  0-5 Units Subcutaneous QHS  . insulin aspart  0-9 Units Subcutaneous TID WC  . insulin glargine  12 Units Subcutaneous QHS  . levothyroxine  25 mcg Oral QAC breakfast  . methylPREDNISolone (SOLU-MEDROL) injection  40 mg Intravenous Daily  . sodium chloride flush  3 mL Intravenous Q12H  . warfarin   Does not apply Once  . Warfarin - Pharmacist Dosing Inpatient   Does not apply q1800   Continuous Infusions: . sodium chloride    . furosemide Stopped (11/12/16 1032)     LOS: 7 days      Alwyn Ren, MD [facility]  If 7PM-7AM, please contact night-coverage [email or web address] Password [facility] 11/12/2016, 12:34 PM

## 2016-11-12 NOTE — Progress Notes (Signed)
ANTICOAGULATION CONSULT NOTE - Follow-up Consult  Pharmacy Consult for warfarin Indication: atrial fibrillation  Allergies  Allergen Reactions  . Sulfa Antibiotics Other (See Comments)    unknown  . Coreg [Carvedilol] Rash    Hair loss  . Zinc Rash   Patient Measurements: Height: 5' (152.4 cm) Weight: 246 lb 7.6 oz (111.8 kg) IBW/kg (Calculated) : 45.5  Vital Signs: Temp: 97.5 F (36.4 C) (09/22 1300) Temp Source: Oral (09/22 1300) BP: 135/56 (09/22 1300) Pulse Rate: 56 (09/22 1300)  Labs:  Recent Labs  11/10/16 0247  11/11/16 0418 11/11/16 1912 11/12/16 0515  HGB 10.7*  --  10.8*  --  10.8*  HCT 36.3  --  36.0  --  36.4  PLT 291  --  316  --  301  LABPROT 18.0*  --  16.1*  --  16.4*  INR 1.50  --  1.30  --  1.33  CREATININE  --   < > 1.77* 1.81* 1.66*  < > = values in this interval not displayed. Estimated Creatinine Clearance: 32.3 mL/min (A) (by C-G formula based on SCr of 1.66 mg/dL (H)).  Medical History: Past Medical History:  Diagnosis Date  . CHF (congestive heart failure) (HCC)   . Diabetes mellitus without complication (HCC)   . Hypertension   . Kidney disease   . Renal disorder   . Shingles    Medications:  Prescriptions Prior to Admission  Medication Sig Dispense Refill Last Dose  . acetaminophen (TYLENOL) 325 MG tablet Take 2 tablets (650 mg total) by mouth every 4 (four) hours as needed for mild pain, moderate pain, fever or headache.   unknown at PRN  . aspirin EC 81 MG tablet Take 81 mg by mouth daily.   11/04/2016 at Unknown time  . carvedilol (COREG) 3.125 MG tablet Take 3.125 mg by mouth 2 (two) times daily.  0 11/04/2016 at 0600  . docusate sodium (COLACE) 100 MG capsule Take 100 mg by mouth 2 (two) times daily as needed for constipation.   UNKNOWN at prn  . furosemide (LASIX) 80 MG tablet Take 1.5 tablets (120 mg total) by mouth 2 (two) times daily. (Patient taking differently: Take 160 mg by mouth 2 (two) times daily. )   11/04/2016 at  Unknown time  . glimepiride (AMARYL) 4 MG tablet Take 4 mg by mouth daily.  1 11/04/2016 at Unknown time  . hydrALAZINE (APRESOLINE) 25 MG tablet Take 25 mg by mouth 3 (three) times daily.  0 11/04/2016 at Unknown time  . pioglitazone (ACTOS) 45 MG tablet Take 45 mg by mouth daily.  0 11/04/2016 at Unknown time  . VENTOLIN HFA 108 (90 Base) MCG/ACT inhaler Inhale 2 puffs into the lungs daily.  1 11/04/2016 at Unknown time    Assessment: Sue Roach a 76 y.o.female with CKD found to have atrial fibrillation. Initially started on Eliquis, but patient cannot afford long term and asked to transition to warfarin. Lovenox will be used until INR therapeutic. Last dose of Eliquis ~1000 on 9/19.  INR subtherapeutic 1.3 after 3 doses of warfarin. Possible impact on baseline INR from apixaban given reduced CrCl. CBC stable. No signs/symptoms of bleeding noted in chart.  On concurrent doxycycline and methylprednisolone, which could impact warfarin sensitivity.   Goal of Therapy:  INR 2-3 Monitor platelets by anticoagulation protocol: Yes   Plan:  Warfarin  x1 tonight Lovenox  once daily Monitor renal function, INR, CBC and for s/sx of bleeding  Ruben Im, PharmD Clinical Pharmacist  11/12/2016 3:22 PM

## 2016-11-12 NOTE — Progress Notes (Signed)
Admit: 11/04/2016 LOS: 7  44F with AoCKD3 and Right sided sCHF exacerbation with dyspnea, cough, hypervolemia, likely exacerbated by COPD + untreated OSA  Subjective:  Weight further down Stable GFR, in fact a little better Not as much UOP yesterday on BID lasix No new issues  09/21 0701 - 09/22 0700 In: 252 [P.O.:252] Out: 1475 [Urine:1475]  Filed Weights   11/10/16 0540 11/11/16 0543 11/12/16 0402  Weight: 115.7 kg (255 lb) 112.9 kg (248 lb 12.8 oz) 111.8 kg (246 lb 7.6 oz)    Scheduled Meds: . benzonatate  100 mg Oral BID  . clotrimazole   Topical BID  . dextromethorphan  15 mg Oral BID  . enoxaparin (LOVENOX) injection  120 mg Subcutaneous Q24H  . guaiFENesin  1,200 mg Oral BID  . hydrALAZINE  25 mg Oral TID  . Influenza vac split quadrivalent PF  0.5 mL Intramuscular Tomorrow-1000  . insulin aspart  0-5 Units Subcutaneous QHS  . insulin aspart  0-9 Units Subcutaneous TID WC  . insulin glargine  12 Units Subcutaneous QHS  . levothyroxine  25 mcg Oral QAC breakfast  . methylPREDNISolone (SOLU-MEDROL) injection  40 mg Intravenous Daily  . sodium chloride flush  3 mL Intravenous Q12H  . warfarin   Does not apply Once  . Warfarin - Pharmacist Dosing Inpatient   Does not apply q1800   Continuous Infusions: . sodium chloride    . furosemide Stopped (11/11/16 1931)   PRN Meds:.sodium chloride, acetaminophen, camphor-menthol, HYDROcodone-homatropine, ipratropium, levalbuterol, ondansetron (ZOFRAN) IV, sodium chloride flush  Current Labs: reviewed    Physical Exam:  Blood pressure (!) 172/64, pulse (!) 59, temperature (!) 97.4 F (36.3 C), temperature source Oral, resp. rate 20, height 5' (1.524 m), weight 111.8 kg (246 lb 7.6 oz), SpO2 97 %. GEN: Chronically ill-appearing, sitting in chair, using nasal cannula  ENT: NCAT  EYES: EOMI  CV: Regular, no murmurs  PULM: Diminished breath sounds in the bases especially but throughout,  ABD: Soft, nontender  SKIN: No rashes  or lesions  EXT:4+ edema in both legs into the knees  A 1. AoCKD3, related to decompensated R sided sCHF + diuretics; improving/resolved 2. R sided CHF exacerbation; hypervolemia improving 3. Presumed OSA, untreated 4. 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 Ongoing tobacco user 6. DM2 7. Hypothyroidism 8. HTN 9. AFlutter to rec warfarin 10. Anemia  P 1. Cont BID Lasix for now; tentative transtion to PO In next 24h based on clinical response 2. Cont BID BMP 3. Continue fluid resection  Sabra Heck MD 11/12/2016, 8:28 AM   Recent Labs Lab 11/07/16 0238 11/08/16 0403 11/09/16 0231  11/11/16 0418 11/11/16 1912 11/12/16 0515  NA 134* 134* 136  < > 139 138 140  K 5.3* 5.1 5.1  < > 4.6 4.9 4.5  CL 100* 100* 102  < > 96* 94* 98*  CO2 < > 32 33* 33*  GLUCOSE 187* 174* 136*  < > 146* 223* 150*  BUN 81* 97* 100*  < > 105* 100* 95*  CREATININE 2.35* 2.20* 1.93*  < > 1.77* 1.81* 1.66*  CALCIUM 8.5* 8.3* 8.5*  < > 8.2* 8.3* 8.2*  PHOS 4.6 5.1* 4.0  --   --   --   --   < > = values in this interval not displayed.  Recent Labs Lab 11/07/16 0238 11/08/16 0403 11/09/16 0231 11/10/16 0247 11/11/16 0418 11/12/16 0515  WBC 9.2 9.0 7.9 8.8 8.5 7.5  NEUTROABS 8.3* 8.1* 7.0  --   --   --  HGB 9.9* 9.6* 9.8* 10.7* 10.8* 10.8*  HCT 33.5* 32.4* 33.2* 36.3 36.0 36.4  MCV 87.5 87.3 87.6 87.1 87.0 87.3  PLT 280 274 276 291 316 301

## 2016-11-13 ENCOUNTER — Inpatient Hospital Stay (HOSPITAL_COMMUNITY): Payer: Medicare Other

## 2016-11-13 LAB — BASIC METABOLIC PANEL
ANION GAP: 10 (ref 5–15)
BUN: 94 mg/dL — AB (ref 6–20)
CO2: 36 mmol/L — ABNORMAL HIGH (ref 22–32)
Calcium: 8.6 mg/dL — ABNORMAL LOW (ref 8.9–10.3)
Chloride: 95 mmol/L — ABNORMAL LOW (ref 101–111)
Creatinine, Ser: 1.72 mg/dL — ABNORMAL HIGH (ref 0.44–1.00)
GFR calc non Af Amer: 27 mL/min — ABNORMAL LOW (ref >60)
GFR, EST AFRICAN AMERICAN: 32 mL/min — AB (ref >60)
GLUCOSE: 97 mg/dL (ref 65–99)
POTASSIUM: 4.2 mmol/L (ref 3.5–5.1)
Sodium: 141 mmol/L (ref 135–145)

## 2016-11-13 LAB — CBC
HEMATOCRIT: 40.7 % (ref 36.0–46.0)
Hemoglobin: 12.1 g/dL (ref 12.0–15.0)
MCH: 26 pg (ref 26.0–34.0)
MCHC: 29.7 g/dL — AB (ref 30.0–36.0)
MCV: 87.5 fL (ref 78.0–100.0)
Platelets: 310 10*3/uL (ref 150–400)
RBC: 4.65 MIL/uL (ref 3.87–5.11)
RDW: 18.9 % — AB (ref 11.5–15.5)
WBC: 10.1 10*3/uL (ref 4.0–10.5)

## 2016-11-13 LAB — PROTIME-INR
INR: 1.55
Prothrombin Time: 18.5 seconds — ABNORMAL HIGH (ref 11.4–15.2)

## 2016-11-13 LAB — GLUCOSE, CAPILLARY
GLUCOSE-CAPILLARY: 83 mg/dL (ref 65–99)
GLUCOSE-CAPILLARY: 144 mg/dL — AB (ref 65–99)
Glucose-Capillary: 260 mg/dL — ABNORMAL HIGH (ref 65–99)
Glucose-Capillary: 354 mg/dL — ABNORMAL HIGH (ref 65–99)

## 2016-11-13 MED ORDER — WARFARIN SODIUM 10 MG PO TABS
10.0000 mg | ORAL_TABLET | Freq: Once | ORAL | Status: AC
  Administered 2016-11-13: 10 mg via ORAL
  Filled 2016-11-13: qty 1

## 2016-11-13 NOTE — Progress Notes (Signed)
Admit: 11/04/2016 LOS: 8  60F with AoCKD3 and Right sided sCHF exacerbation with dyspnea, cough, hypervolemia, likely exacerbated by COPD + untreated OSA  Subjective:  Weight further down Stable GFR, in fact a little better No new issues No c/o this AM  09/22 0701 - 09/23 0700 In: 1146 [P.O.:960; IV Piggyback:186] Out: 2400 [Urine:2400]  Filed Weights   11/11/16 0543 11/12/16 0402 11/13/16 0451  Weight: 112.9 kg (248 lb 12.8 oz) 111.8 kg (246 lb 7.6 oz) 111.1 kg (244 lb 14.4 oz)    Scheduled Meds: . benzonatate  100 mg Oral BID  . clotrimazole   Topical BID  . dextromethorphan  15 mg Oral BID  . enoxaparin (LOVENOX) injection  120 mg Subcutaneous Q24H  . guaiFENesin  1,200 mg Oral BID  . hydrALAZINE  25 mg Oral TID  . Influenza vac split quadrivalent PF  0.5 mL Intramuscular Tomorrow-1000  . insulin aspart  0-5 Units Subcutaneous QHS  . insulin aspart  0-9 Units Subcutaneous TID WC  . insulin glargine  12 Units Subcutaneous QHS  . levothyroxine  25 mcg Oral QAC breakfast  . methylPREDNISolone (SOLU-MEDROL) injection  40 mg Intravenous Daily  . sodium chloride flush  3 mL Intravenous Q12H  . warfarin   Does not apply Once  . Warfarin - Pharmacist Dosing Inpatient   Does not apply q1800   Continuous Infusions: . sodium chloride    . furosemide Stopped (11/13/16 0923)   PRN Meds:.sodium chloride, acetaminophen, camphor-menthol, HYDROcodone-homatropine, ipratropium, levalbuterol, ondansetron (ZOFRAN) IV, sodium chloride flush  Current Labs: reviewed    Physical Exam:  Blood pressure (!) 156/64, pulse 82, temperature 97.7 F (36.5 C), temperature source Oral, resp. rate 18, height 5' (1.524 m), weight 111.1 kg (244 lb 14.4 oz), SpO2 93 %. GEN: Chronically ill-appearing, sitting in chair, using nasal cannula  ENT: NCAT  EYES: EOMI  CV: Regular, no murmurs  PULM: Diminished breath sounds in the bases especially but throughout,  ABD: Soft, nontender  SKIN: No rashes or  lesions  EXT:4+ edema in both legs into the knees  A 1. AoCKD3, related to decompensated R sided sCHF + diuretics; improving/resolved 2. R sided CHF exacerbation; hypervolemia improving 3. Presumed OSA, untreated 4. 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 Ongoing tobacco user 6. DM2 7. Hypothyroidism 8. HTN 9. AFlutter to rec warfarin 10. Anemia  P 1. Cont BID Lasix for now; tentative transtion to PO In next 24h based on clinical response 2. Cont BID BMP 3. Continue fluid restriction  Sabra Heck MD 11/13/2016, 10:23 AM   Recent Labs Lab 11/07/16 0238 11/08/16 0403 11/09/16 0231  11/12/16 0515 11/12/16 1601 11/13/16 0839  NA 134* 134* 136  < > 140 139 141  K 5.3* 5.1 5.1  < > 4.5 4.7 4.2  CL 100* 100* 102  < > 98* 96* 95*  CO2 < > 33* 33* 36*  GLUCOSE 187* 174* 136*  < > 150* 169* 97  BUN 81* 97* 100*  < > 95* 96* 94*  CREATININE 2.35* 2.20* 1.93*  < > 1.66* 1.72* 1.72*  CALCIUM 8.5* 8.3* 8.5*  < > 8.2* 8.4* 8.6*  PHOS 4.6 5.1* 4.0  --   --   --   --   < > = values in this interval not displayed.  Recent Labs Lab 11/07/16 0238 11/08/16 0403 11/09/16 0231  11/11/16 0418 11/12/16 0515 11/13/16 0839  WBC 9.2 9.0 7.9  < > 8.5 7.5 10.1  NEUTROABS 8.3* 8.1*  7.0  --   --   --   --   HGB 9.9* 9.6* 9.8*  < > 10.8* 10.8* 12.1  HCT 33.5* 32.4* 33.2*  < > 36.0 36.4 40.7  MCV 87.5 87.3 87.6  < > 87.0 87.3 87.5  PLT 280 274 276  < > 316 301 310  < > = values in this interval not displayed.

## 2016-11-13 NOTE — Progress Notes (Signed)
ANTICOAGULATION CONSULT NOTE - Follow-up Consult  Pharmacy Consult for warfarin Indication: atrial fibrillation  Allergies  Allergen Reactions  . Sulfa Antibiotics Other (See Comments)    unknown  . Coreg [Carvedilol] Rash    Hair loss  . Zinc Rash   Patient Measurements: Height: 5' (152.4 cm) Weight: 244 lb 14.4 oz (111.1 kg) IBW/kg (Calculated) : 45.5  Vital Signs: Temp: 97.7 F (36.5 C) (09/23 0508) Temp Source: Oral (09/23 0508) BP: 140/80 (09/23 0757) Pulse Rate: 80 (09/23 0757)  Labs:  Recent Labs  11/11/16 0418  11/12/16 0515 11/12/16 1601 11/13/16 0839  HGB 10.8*  --  10.8*  --  12.1  HCT 36.0  --  36.4  --  40.7  PLT 316  --  301  --  310  LABPROT 16.1*  --  16.4*  --  18.5*  INR 1.30  --  1.33  --  1.55  CREATININE 1.77*  < > 1.66* 1.72* 1.72*  < > = values in this interval not displayed. Estimated Creatinine Clearance: 31 mL/min (A) (by C-G formula based on SCr of 1.72 mg/dL (H)).  Medical History: Past Medical History:  Diagnosis Date  . CHF (congestive heart failure) (HCC)   . Diabetes mellitus without complication (HCC)   . Hypertension   . Kidney disease   . Renal disorder   . Shingles    Medications:  Prescriptions Prior to Admission  Medication Sig Dispense Refill Last Dose  . acetaminophen (TYLENOL) 325 MG tablet Take 2 tablets (650 mg total) by mouth every 4 (four) hours as needed for mild pain, moderate pain, fever or headache.   unknown at PRN  . aspirin EC 81 MG tablet Take 81 mg by mouth daily.   11/04/2016 at Unknown time  . carvedilol (COREG) 3.125 MG tablet Take 3.125 mg by mouth 2 (two) times daily.  0 11/04/2016 at 0600  . docusate sodium (COLACE) 100 MG capsule Take 100 mg by mouth 2 (two) times daily as needed for constipation.   UNKNOWN at prn  . furosemide (LASIX) 80 MG tablet Take 1.5 tablets (120 mg total) by mouth 2 (two) times daily. (Patient taking differently: Take 160 mg by mouth 2 (two) times daily. )   11/04/2016 at  Unknown time  . glimepiride (AMARYL) 4 MG tablet Take 4 mg by mouth daily.  1 11/04/2016 at Unknown time  . hydrALAZINE (APRESOLINE) 25 MG tablet Take 25 mg by mouth 3 (three) times daily.  0 11/04/2016 at Unknown time  . pioglitazone (ACTOS) 45 MG tablet Take 45 mg by mouth daily.  0 11/04/2016 at Unknown time  . VENTOLIN HFA 108 (90 Base) MCG/ACT inhaler Inhale 2 puffs into the lungs daily.  1 11/04/2016 at Unknown time    Assessment: Sue Roach a 76 y.o.female with CKD found to have atrial fibrillation. Initially started on Eliquis, but patient cannot afford long term and asked to transition to warfarin. Lovenox will be used until INR therapeutic. Last dose of Eliquis ~1000 on 9/19.  INR subtherapeutic 1.55 after 4 doses of warfarin. CBC stable. No signs/symptoms of bleeding noted in chart.  On concurrent doxycycline and methylprednisolone, which could impact warfarin sensitivity.   Goal of Therapy:  INR 2-3 Monitor platelets by anticoagulation protocol: Yes   Plan:  Warfarin  x1 tonight Lovenox  once daily Monitor renal function, INR, CBC and for s/sx of bleeding  Ruben Im, PharmD Clinical Pharmacist 11/13/2016 12:17 PM

## 2016-11-13 NOTE — Progress Notes (Signed)
Progress Note  Patient Name: Sue Roach Date of Encounter: 11/13/2016  Primary Cardiologist:   New  Subjective   Breathing is a little better  No CP     Inpatient Medications    Scheduled Meds: . benzonatate  100 mg Oral BID  . clotrimazole   Topical BID  . dextromethorphan  15 mg Oral BID  . enoxaparin (LOVENOX) injection  120 mg Subcutaneous Q24H  . guaiFENesin  1,200 mg Oral BID  . hydrALAZINE  25 mg Oral TID  . Influenza vac split quadrivalent PF  0.5 mL Intramuscular Tomorrow-1000  . insulin aspart  0-5 Units Subcutaneous QHS  . insulin aspart  0-9 Units Subcutaneous TID WC  . insulin glargine  12 Units Subcutaneous QHS  . levothyroxine  25 mcg Oral QAC breakfast  . methylPREDNISolone (SOLU-MEDROL) injection  40 mg Intravenous Daily  . sodium chloride flush  3 mL Intravenous Q12H  . warfarin   Does not apply Once  . Warfarin - Pharmacist Dosing Inpatient   Does not apply q1800   Continuous Infusions: . sodium chloride    . furosemide 120 mg (11/13/16 0804)   PRN Meds: sodium chloride, acetaminophen, camphor-menthol, HYDROcodone-homatropine, ipratropium, levalbuterol, ondansetron (ZOFRAN) IV, sodium chloride flush   Vital Signs    Vitals:   11/12/16 1300 11/12/16 2005 11/13/16 0451 11/13/16 0508  BP: (!) 135/56 (!) 157/71  (!) 156/64  Pulse: (!) 56 61  82  Resp: Temp: (!) 97.5 F (36.4 C) 97.7 F (36.5 C)  97.7 F (36.5 C)  TempSrc: Oral Oral  Oral  SpO2:  93%  93%  Weight:   244 lb 14.4 oz (111.1 kg)   Height:        Intake/Output Summary (Last 24 hours) at 11/13/16 0813 Last data filed at 11/13/16 0452  Gross per 24 hour  Intake             1146 ml  Output             2400 ml  Net            -1254 ml  Net neg 8.7 L   Filed Weights   11/11/16 0543 11/12/16 0402 11/13/16 0451  Weight: 248 lb 12.8 oz (112.9 kg) 246 lb 7.6 oz (111.8 kg) 244 lb 14.4 oz (111.1 kg)    Telemetry    afib 50 to 60s   - Personally  Reviewed  ECG      Physical Exam   GEN: No acute distress.   Neck: Neck full   Cardiac: Irreg irreg  , no murmurs, rubs, or gallops.  Respiratory: Rel clear   GI: Soft, nontender, non-distended  MS: Tr to  1+ LE edema  ; No deformity. Neuro:  Nonfocal  Psych: Normal affect   Labs    Chemistry Recent Labs Lab 11/07/16 0238 11/08/16 0403 11/09/16 0231  11/11/16 1912 11/12/16 0515 11/12/16 1601  NA 134* 134* 136  < > 138 140 139  K 5.3* 5.1 5.1  < > 4.9 4.5 4.7  CL 100* 100* 102  < > 94* 98* 96*  CO2 < > 33* 33* 33*  GLUCOSE 187* 174* 136*  < > 223* 150* 169*  BUN 81* 97* 100*  < > 100* 95* 96*  CREATININE 2.35* 2.20* 1.93*  < > 1.81* 1.66* 1.72*  CALCIUM 8.5* 8.3* 8.5*  < > 8.3* 8.2* 8.4*  PROT 6.9 6.8 6.6  --   --   --   --  ALBUMIN 3.0* 3.0* 3.1*  --   --   --   --   AST 27 34 27  --   --   --   --   ALT 25 40 44  --   --   --   --   ALKPHOS 88 78 77  --   --   --   --   BILITOT 0.7 0.7 0.9  --   --   --   --   GFRNONAA 19* 20* 24*  < > 26* 29* 27*  GFRAA 22* 24* 28*  < > 30* 33* 32*  ANIONGAP 4* 10 6  < > < > = values in this interval not displayed.   Hematology  Recent Labs Lab 11/10/16 0247 11/11/16 0418 11/12/16 0515  WBC 8.8 8.5 7.5  RBC 4.17 4.14 4.17  HGB 10.7* 10.8* 10.8*  HCT 36.3 36.0 36.4  MCV 87.1 87.0 87.3  MCH 25.7* 26.1 25.9*  MCHC 29.5* 30.0 29.7*  RDW 18.4* 18.7* 18.8*  PLT 291 316 301    Cardiac EnzymesNo results for input(s): TROPONINI in the last 168 hours. No results for input(s): TROPIPOC in the last 168 hours.   BNPNo results for input(s): BNP, PROBNP in the last 168 hours.   DDimer No results for input(s): DDIMER in the last 168 hours.   Radiology    No results found.  Cardiac Studies     Patient Profile     76 y.o. female with DM, HTN, CKD, and R sided CHF    Assessment & Plan    1  Acute on chronic right sided CHF  Volume status is improving Still increased   I stressed with pt the  importance of wearing oxygen to avoid desaturation and compromise of cardiac function  She agrees to do this at home  Follow   2  COPD  ON O2       2  Atrila flutter  Rate control  Anticoag with coumadin    For questions or updates, please contact [facility] Please consult [email or web address] for contact info under Cardiology/STEMI.      Signed, Dietrich Pates, MD  11/13/2016, 8:13 AM

## 2016-11-13 NOTE — Progress Notes (Signed)
Pt is getting up, moving around the room, vitals stable, no any complain of chest pain and SOB so far, is in oxygen /min, still having coughing spells, getting covered by PRN plus scheduled cough meds, IV lasix continue, IV solumedrol continue till this point, will continue to monitor the patient

## 2016-11-13 NOTE — Progress Notes (Signed)
Patient refused bed alarm. Will continue to monitor patient. 

## 2016-11-13 NOTE — Progress Notes (Signed)
PROGRESS NOTE    Sue CaOliva [id number] DOB: Jul 26, 1940 DOA: 11/04/2016 PCP: Kaleen Mask, MD   Brief Narrative: Sherran Ne[age] y.o.femalewith medical history significant for obesity, chronic right-sided heart failure on diuretics with pulmonary HTN, DM 2on oral agents, chronic lower extremity stasis dermatitis, CKD 4,chronic anemia and hypertension. Patient reports that 3 days ago she developed a scratchy, sore throat with a headache and persistent coughing of clear mucoid sputum. No fevers no chills. No significant change in chronic lower extremity edema. She did use a prescribed cream on her left lower extremity yesterday but her skin began to burn and she removed the cream and noticed a residual redness/erythema after removal of the cream. She went to her PCP office today for she was found to be hypoxemic. She does not wear chronic oxygen. Upon presentation to the ER on room air her saturations were 60%. Chest x-ray revealed early edema without effusion and cardiomegaly. Patient continued to refuse bipap.she was tried on high dose lasix 120 mg tid with good response.lasix dose has been changed to bid.beta blocker was stopped due to bradycardia.was treated with doxycycline.     Assessment & Plan:   Principal Problem:   Acute on chronic right-sided congestive heart failure (HCC) Active Problems:   Diabetes mellitus without complication (HCC)   Hypertension, uncontrolled   Hypothyroidism, adult   Acute respiratory failure with hypoxemia (HCC)   CKD (chronic kidney disease) stage 4, GFR 15-29 ml/min (HCC)   Chronic anemia   Atrial flutter (HCC)  CHF responding to high dose lasix.dose.get chest xray. Aflutter/brady ekg.dc beta blocker.cards following.continue coumadin per pharmacy. CKD renal function stable.  DVT prophylaxis:coumadin Code Status:full Family Communication:  Disposition Plan: tbd   Consultants:  nephro,cardio  Proceduresnone  Antimicrobials: none   Subjective:co of cough.  Objective: Vitals:   11/13/16 0451 11/13/16 0508 11/13/16 0757 11/13/16 1300  BP:  (!) 156/64 140/80 (!) 159/72  Pulse:  82 80 60  Resp:  18  18  Temp:  97.7 F (36.5 C)  97.6 F (36.4 C)  TempSrc:  Oral  Oral  SpO2:  93%  95%  Weight: 111.1 kg (244 lb 14.4 oz)     Height:        Intake/Output Summary (Last 24 hours) at 11/13/16 1348 Last data filed at 11/13/16 0804  Gross per 24 hour  Intake              906 ml  Output             2000 ml  Net            -1094 ml   Filed Weights   11/11/16 0543 11/12/16 0402 11/13/16 0451  Weight: 112.9 kg (248 lb 12.8 oz) 111.8 kg (246 lb 7.6 oz) 111.1 kg (244 lb 14.4 oz)    Examination:  General exam: Appears calm and comfortable  Respiratory system:DECRESED BREATH SOUNDS At the bases.Marland Kitchen Respiratory effort normal. Cardiovascular system: S1 & S2 heard, RRR. No JVD, murmurs, rubs, gallops or clicks. No pedal edema. Gastrointestinal system: Abdomen is nondistended, soft and nontender. No organomegaly or masses felt. Normal bowel sounds heard. Central nervous system: Alert and oriented. No focal neurological deficits. Extremities: Symmetric 5 x 5 power. Skin: No rashes, lesions or ulcers Psychiatry: Judgement and insight appear normal. Mood & affect appropriate.     Data Reviewed: I have personally reviewed following labs and imaging studies  CBC:  Recent Labs Lab 11/07/16 0238 11/08/16 0403 11/09/16 0231 11/10/16 0247 11/11/16  9604 11/12/16 0515 11/13/16 0839  WBC 9.2 9.0 7.9 8.8 8.5 7.5 10.1  NEUTROABS 8.3* 8.1* 7.0  --   --   --   --   HGB 9.9* 9.6* 9.8* 10.7* 10.8* 10.8* 12.1  HCT 33.5* 32.4* 33.2* 36.3 36.0 36.4 40.7  MCV 87.5 87.3 87.6 87.1 87.0 87.3 87.5  PLT 280 274 276 291 316 301 310   Basic Metabolic Panel:  Recent Labs Lab 11/07/16 0238 11/08/16 0403 11/09/16 0231  11/11/16 0418 11/11/16 1912 11/12/16 0515  11/12/16 1601 11/13/16 0839  NA 134* 134* 136  < > 139 138 140 139 141  K 5.3* 5.1 5.1  < > 4.6 4.9 4.5 4.7 4.2  CL 100* 100* 102  < > 96* 94* 98* 96* 95*  CO2 < > 32 33* 33* 33* 36*  GLUCOSE 187* 174* 136*  < > 146* 223* 150* 169* 97  BUN 81* 97* 100*  < > 105* 100* 95* 96* 94*  CREATININE 2.35* 2.20* 1.93*  < > 1.77* 1.81* 1.66* 1.72* 1.72*  CALCIUM 8.5* 8.3* 8.5*  < > 8.2* 8.3* 8.2* 8.4* 8.6*  MG 2.5* 2.6* 2.7*  --   --   --   --   --   --   PHOS 4.6 5.1* 4.0  --   --   --   --   --   --   < > = values in this interval not displayed. GFR: Estimated Creatinine Clearance: 31 mL/min (A) (by C-G formula based on SCr of 1.72 mg/dL (H)). Liver Function Tests:  Recent Labs Lab 11/07/16 0238 11/08/16 0403 11/09/16 0231  AST 27 34 27  ALT 25 40 44  ALKPHOS 88 78 77  BILITOT 0.7 0.7 0.9  PROT 6.9 6.8 6.6  ALBUMIN 3.0* 3.0* 3.1*   No results for input(s): LIPASE, AMYLASE in the last 168 hours. No results for input(s): AMMONIA in the last 168 hours. Coagulation Profile:  Recent Labs Lab 11/10/16 0247 11/11/16 0418 11/12/16 0515 11/13/16 0839  INR 1.50 1.30 1.33 1.55   Cardiac Enzymes: No results for input(s): CKTOTAL, CKMB, CKMBINDEX, TROPONINI in the last 168 hours. BNP (last 3 results) No results for input(s): PROBNP in the last 8760 hours. HbA1C: No results for input(s): HGBA1C in the last 72 hours. CBG:  Recent Labs Lab 11/12/16 1232 11/12/16 1633 11/12/16 2102 11/13/16 0739 11/13/16 1141  GLUCAP 130* 169* 181* 83 144*   Lipid Profile: No results for input(s): CHOL, HDL, LDLCALC, TRIG, CHOLHDL, LDLDIRECT in the last 72 hours. Thyroid Function Tests: No results for input(s): TSH, T4TOTAL, FREET4, T3FREE, THYROIDAB in the last 72 hours. Anemia Panel: No results for input(s): VITAMINB12, FOLATE, FERRITIN, TIBC, IRON, RETICCTPCT in the last 72 hours. Sepsis Labs: No results for input(s): PROCALCITON, LATICACIDVEN in the last 168 hours.  Recent  Results (from the past 240 hour(s))  MRSA PCR Screening     Status: None   Collection Time: 11/04/16 10:09 PM  Result Value Ref Range Status   MRSA by PCR NEGATIVE NEGATIVE Final    Comment:        The GeneXpert MRSA Assay (FDA approved for NASAL specimens only), is one component of a comprehensive MRSA colonization surveillance program. It is not intended to diagnose MRSA infection nor to guide or monitor treatment for MRSA infections.   Respiratory Panel by PCR     Status: Abnormal   Collection Time: 11/05/16  3:50 AM  Result Value Ref Range  Status   Adenovirus NOT DETECTED NOT DETECTED Final   Coronavirus 229E NOT DETECTED NOT DETECTED Final   Coronavirus HKU1 NOT DETECTED NOT DETECTED Final   Coronavirus NL63 NOT DETECTED NOT DETECTED Final   Coronavirus OC43 NOT DETECTED NOT DETECTED Final   Metapneumovirus NOT DETECTED NOT DETECTED Final   Rhinovirus / Enterovirus DETECTED (A) NOT DETECTED Final   Influenza A NOT DETECTED NOT DETECTED Final   Influenza A H1 NOT DETECTED NOT DETECTED Final   Influenza A H1 2009 NOT DETECTED NOT DETECTED Final   Influenza A H3 NOT DETECTED NOT DETECTED Final   Influenza B NOT DETECTED NOT DETECTED Final   Parainfluenza Virus 1 NOT DETECTED NOT DETECTED Final   Parainfluenza Virus 2 NOT DETECTED NOT DETECTED Final   Parainfluenza Virus 3 NOT DETECTED NOT DETECTED Final   Parainfluenza Virus 4 NOT DETECTED NOT DETECTED Final   Respiratory Syncytial Virus NOT DETECTED NOT DETECTED Final   Bordetella pertussis NOT DETECTED NOT DETECTED Final   Chlamydophila pneumoniae NOT DETECTED NOT DETECTED Final   Mycoplasma pneumoniae NOT DETECTED NOT DETECTED Final         Radiology Studies: No results found.      Scheduled Meds: . benzonatate  100 mg Oral BID  . clotrimazole   Topical BID  . dextromethorphan  15 mg Oral BID  . enoxaparin (LOVENOX) injection  120 mg Subcutaneous Q24H  . guaiFENesin  1,200 mg Oral BID  . hydrALAZINE  25  mg Oral TID  . Influenza vac split quadrivalent PF  0.5 mL Intramuscular Tomorrow-1000  . insulin aspart  0-5 Units Subcutaneous QHS  . insulin aspart  0-9 Units Subcutaneous TID WC  . insulin glargine  12 Units Subcutaneous QHS  . levothyroxine  25 mcg Oral QAC breakfast  . methylPREDNISolone (SOLU-MEDROL) injection  40 mg Intravenous Daily  . sodium chloride flush  3 mL Intravenous Q12H  . warfarin  10 mg Oral ONCE-1800  . warfarin   Does not apply Once  . Warfarin - Pharmacist Dosing Inpatient   Does not apply q1800   Continuous Infusions: . sodium chloride    . furosemide Stopped (11/13/16 1610)     LOS: 8 days      Alwyn Ren, MD [facility]  If 7PM-7AM, please contact night-coverage [email or web address] Password [email or web address] 11/13/2016, 1:48 PM

## 2016-11-14 LAB — BASIC METABOLIC PANEL
ANION GAP: 8 (ref 5–15)
BUN: 92 mg/dL — ABNORMAL HIGH (ref 6–20)
CHLORIDE: 98 mmol/L — AB (ref 101–111)
CO2: 36 mmol/L — ABNORMAL HIGH (ref 22–32)
Calcium: 8.1 mg/dL — ABNORMAL LOW (ref 8.9–10.3)
Creatinine, Ser: 1.71 mg/dL — ABNORMAL HIGH (ref 0.44–1.00)
GFR calc Af Amer: 32 mL/min — ABNORMAL LOW (ref >60)
GFR calc non Af Amer: 28 mL/min — ABNORMAL LOW (ref >60)
Glucose, Bld: 105 mg/dL — ABNORMAL HIGH (ref 65–99)
POTASSIUM: 4.2 mmol/L (ref 3.5–5.1)
Sodium: 142 mmol/L (ref 135–145)

## 2016-11-14 LAB — PROTIME-INR
INR: 1.97
Prothrombin Time: 22.3 seconds — ABNORMAL HIGH (ref 11.4–15.2)

## 2016-11-14 LAB — CBC
HEMATOCRIT: 37 % (ref 36.0–46.0)
Hemoglobin: 11 g/dL — ABNORMAL LOW (ref 12.0–15.0)
MCH: 25.9 pg — AB (ref 26.0–34.0)
MCHC: 29.7 g/dL — AB (ref 30.0–36.0)
MCV: 87.1 fL (ref 78.0–100.0)
PLATELETS: 281 10*3/uL (ref 150–400)
RBC: 4.25 MIL/uL (ref 3.87–5.11)
RDW: 18.9 % — AB (ref 11.5–15.5)
WBC: 7 10*3/uL (ref 4.0–10.5)

## 2016-11-14 LAB — GLUCOSE, CAPILLARY
GLUCOSE-CAPILLARY: 87 mg/dL (ref 65–99)
GLUCOSE-CAPILLARY: 239 mg/dL — AB (ref 65–99)

## 2016-11-14 MED ORDER — ISOSORBIDE MONONITRATE ER 30 MG PO TB24
15.0000 mg | ORAL_TABLET | Freq: Every day | ORAL | Status: DC
  Filled 2016-11-14: qty 1

## 2016-11-14 MED ORDER — FUROSEMIDE 80 MG PO TABS
120.0000 mg | ORAL_TABLET | Freq: Two times a day (BID) | ORAL | Status: DC
  Administered 2016-11-14 – 2016-11-16 (×5): 120 mg via ORAL
  Filled 2016-11-14 (×5): qty 1

## 2016-11-14 MED ORDER — PREDNISONE 20 MG PO TABS
20.0000 mg | ORAL_TABLET | Freq: Every day | ORAL | Status: DC
  Administered 2016-11-15 – 2016-11-16 (×2): 20 mg via ORAL
  Filled 2016-11-14 (×2): qty 1

## 2016-11-14 NOTE — Progress Notes (Signed)
Inpatient Diabetes Program Recommendations  AACE/ADA: New Consensus Statement on Inpatient Glycemic Control (2015)  Target Ranges:  Prepandial:   less than 140 mg/dL      Peak postprandial:   less than 180 mg/dL (1-2 hours)      Critically ill patients:  140 - 180 mg/dL   Results for IREOLUWA, GORSLINE (MRN [id number]) as of 11/14/2016 09:59  Ref. Range 11/13/2016 07:39 11/13/2016 11:41 11/13/2016 16:33 11/13/2016 20:49 11/14/2016 07:35  Glucose-Capillary Latest Ref Range: 65 - 99 mg/dL 83 409 (H) 811 (H) 914 (H) 87   Review of Glycemic Control  Diabetes history: DM2 Outpatient Diabetes medications: Actos 45 mg daily, Amaryl 4 mg daily Current orders for Inpatient glycemic control: Lantus 12 units QHS, Novolog 0-9 units TID with meals, Novolog 0-5 units QHS  Inpatient Diabetes Program Recommendations:  Insulin - Meal Coverage:Post prandial glucose is consistently elevated. If steroids are continued, please consider ordering Novolog 3 units TID with meals for meal coverage if patient eats at least 50% of meals.  Thanks, Orlando Penner, RN, MSN, CDE Diabetes Coordinator Inpatient Diabetes Program [phone] (Team Pager from 8am to 5pm)

## 2016-11-14 NOTE — Progress Notes (Signed)
Pt is alert and oriented is grumpy and guarded denies being in Aflutter do to not having sympotoms with care, refused thyroid medication and Iv lasix. exceptation  to go home.

## 2016-11-14 NOTE — Progress Notes (Signed)
PROGRESS NOTE    Sue Roach  ZOX:[id number] DOB: Aug 15, 1940 DOA: 11/04/2016 PCP: Kaleen Mask, MD    Brief Narrative: 76 y.o.femalewith medical history significant for obesity, chronic right-sided heart failure on diuretics with pulmonary HTN, DM 2on oral agents, chronic lower extremity stasis dermatitis, CKD 4,chronic anemia and hypertension. Patient reports that 3 days ago she developed a scratchy, sore throat with a headache and persistent coughing of clear mucoid sputum. No fevers no chills. No significant change in chronic lower extremity edema. She did use a prescribed cream on her left lower extremity yesterday but her skin began to burn and she removed the cream and noticed a residual redness/erythema after removal of the cream. She went to her PCP office today for she was found to be hypoxemic. She does not wear chronic oxygen. Upon presentation to the ER on room air her saturations were 60%. Chest x-ray revealed early edema without effusion and cardiomegaly. Patient continued to refuse bipap.she was tried on high dose lasix 120 mg tid with good response.lasix dose has been changed to bid.beta blocker was stopped due to bradycardia.was treated with doxycycline. Patient has been treated with IV Lasix 120 mg 3 times a day. She has been sufficient to by mouth Lasix 120 mg twice a day starting today. She was also treated with steroids and doxycycline. Patient is up in her chair denies any new complaints today she feels of breathing is better.A chest x-ray that was done yesterday showed findings consistent with mild CHF.    Assessment & Plan:   Principal Problem:   Acute on chronic right-sided congestive heart failure (HCC) Active Problems:   Diabetes mellitus without complication (HCC)   Hypertension, uncontrolled   Hypothyroidism, adult   Acute respiratory failure with hypoxemia (HCC)   CKD (chronic kidney disease) stage 4, GFR 15-29 ml/min (HCC)   Chronic  anemia   Atrial flutter (HCC)  CHF responding to high dose lasix.Lasix dose changed to by mouth today. Chest x-ray shows mild CHF. Nephrology following and cardiology following. Aflutter/brady ekg.dc beta blocker.cards following.continue coumadin per pharmacy. CKD renal function stable.FOLLOWRENAL FUNCTIONS DAILY. DMCONTINUE LANTUS. HTNPATIENT IS ON lASIX IMDUR,HYDRALAZINE HYPOTHYROIDSM CONTINUE sYNTHROID Copd/PNEUMONIA STATUS POST TREATMENT WITH DOXYCYCLINE. sOLU-mEDROL HAS BEEN dc'D TODAY. sTART PREDNISONE TAPER.  DVT prophylaxis:coumadin Code Status:full Family Communication:  Disposition Plan: i would think patient would be able to be discharged later this weekif all goes well.     DVT prophylaxis: LOVENOX AND cOUMADIN Code Status:FULL Family Communication: NONE Disposition Plan: DISCHARGE HOME PROBABLY LATER PART OF THIS WEEK. oN cOUMADIN.   Consultants: NEPHROLOGY, CARDIOLOGY  Procedures: NONE  Antimicrobials: NONE   Subjective:SITTING UP IN CHAIR FEELS BREATHING IS BETTERDENIES ANY MORE DIZZINESS.   Objective: Vitals:   11/13/16 1700 11/13/16 2016 11/14/16 0503 11/14/16 1134  BP: 140/80 131/73 (!) 141/55 (!) 146/59  Pulse:  61 (!) 59 (!) 51  Resp:  Temp:  98 F (36.7 C) 97.6 F (36.4 C) 97.7 F (36.5 C)  TempSrc:  Oral Oral Oral  SpO2:  94% 96% 98%  Weight:   110.9 kg (244 lb 9.6 oz)   Height:        Intake/Output Summary (Last 24 hours) at 11/14/16 1545 Last data filed at 11/14/16 1412  Gross per 24 hour  Intake             1440 ml  Output             3200 ml  Net            -  1760 ml   Filed Weights   11/12/16 0402 11/13/16 0451 11/14/16 0503  Weight: 111.8 kg (246 lb 7.6 oz) 111.1 kg (244 lb 14.4 oz) 110.9 kg (244 lb 9.6 oz)    Examination:  General exam: Appears calm and comfortable  Respiratory system: DECREASED BREATH SOUNDS AT THE BASES.Marland Kitchen Respiratory effort normal. Cardiovascular system: S1 & S2 heard, RRR. No JVD, murmurs,  rubs, gallops or clicks. No pedal edema. Gastrointestinal system: Abdomen is nondistended, soft and nontender. No organomegaly or masses felt. Normal bowel sounds heard. Central nervous system: Alert and oriented. No focal neurological deficits. Extremities: Symmetric 5 x 5 power. Skin: No rashes, lesions or ulcers Psychiatry: Judgement and insight appear normal. Mood & affect appropriate.     Data Reviewed: I have personally reviewed following labs and imaging studies  CBC:  Recent Labs Lab 11/08/16 0403 11/09/16 0231 11/10/16 0247 11/11/16 0418 11/12/16 0515 11/13/16 0839 11/14/16 0440  WBC 9.0 7.9 8.8 8.5 7.5 10.1 7.0  NEUTROABS 8.1* 7.0  --   --   --   --   --   HGB 9.6* 9.8* 10.7* 10.8* 10.8* 12.1 11.0*  HCT 32.4* 33.2* 36.3 36.0 36.4 40.7 37.0  MCV 87.3 87.6 87.1 87.0 87.3 87.5 87.1  PLT 274 276 291 316 301 310 281   Basic Metabolic Panel:  Recent Labs Lab 11/08/16 0403 11/09/16 0231  11/11/16 1912 11/12/16 0515 11/12/16 1601 11/13/16 0839 11/14/16 0440  NA 134* 136  < > 138 140 139 141 142  K 5.1 5.1  < > 4.9 4.5 4.7 4.2 4.2  CL 100* 102  < > 94* 98* 96* 95* 98*  CO2 24 28  < > 33* 33* 33* 36* 36*  GLUCOSE 174* 136*  < > 223* 150* 169* 97 105*  BUN 97* 100*  < > 100* 95* 96* 94* 92*  CREATININE 2.20* 1.93*  < > 1.81* 1.66* 1.72* 1.72* 1.71*  CALCIUM 8.3* 8.5*  < > 8.3* 8.2* 8.4* 8.6* 8.1*  MG 2.6* 2.7*  --   --   --   --   --   --   PHOS 5.1* 4.0  --   --   --   --   --   --   < > = values in this interval not displayed. GFR: Estimated Creatinine Clearance: 31.2 mL/min (A) (by C-G formula based on SCr of 1.71 mg/dL (H)). Liver Function Tests:  Recent Labs Lab 11/08/16 0403 11/09/16 0231  AST 34 27  ALT 40 44  ALKPHOS 78 77  BILITOT 0.7 0.9  PROT 6.8 6.6  ALBUMIN 3.0* 3.1*   No results for input(s): LIPASE, AMYLASE in the last 168 hours. No results for input(s): AMMONIA in the last 168 hours. Coagulation Profile:  Recent Labs Lab  11/10/16 0247 11/11/16 0418 11/12/16 0515 11/13/16 0839 11/14/16 0440  INR 1.50 1.30 1.33 1.55 1.97   Cardiac Enzymes: No results for input(s): CKTOTAL, CKMB, CKMBINDEX, TROPONINI in the last 168 hours. BNP (last 3 results) No results for input(s): PROBNP in the last 8760 hours. HbA1C: No results for input(s): HGBA1C in the last 72 hours. CBG:  Recent Labs Lab 11/13/16 0739 11/13/16 1141 11/13/16 1633 11/13/16 2049 11/14/16 0735  GLUCAP 83 144* 260* 354* 87   Lipid Profile: No results for input(s): CHOL, HDL, LDLCALC, TRIG, CHOLHDL, LDLDIRECT in the last 72 hours. Thyroid Function Tests: No results for input(s): TSH, T4TOTAL, FREET4, T3FREE, THYROIDAB in the last 72 hours. Anemia Panel: No results  for input(s): VITAMINB12, FOLATE, FERRITIN, TIBC, IRON, RETICCTPCT in the last 72 hours. Sepsis Labs: No results for input(s): PROCALCITON, LATICACIDVEN in the last 168 hours.  Recent Results (from the past 240 hour(s))  MRSA PCR Screening     Status: None   Collection Time: 11/04/16 10:09 PM  Result Value Ref Range Status   MRSA by PCR NEGATIVE NEGATIVE Final    Comment:        The GeneXpert MRSA Assay (FDA approved for NASAL specimens only), is one component of a comprehensive MRSA colonization surveillance program. It is not intended to diagnose MRSA infection nor to guide or monitor treatment for MRSA infections.   Respiratory Panel by PCR     Status: Abnormal   Collection Time: 11/05/16  3:50 AM  Result Value Ref Range Status   Adenovirus NOT DETECTED NOT DETECTED Final   Coronavirus 229E NOT DETECTED NOT DETECTED Final   Coronavirus HKU1 NOT DETECTED NOT DETECTED Final   Coronavirus NL63 NOT DETECTED NOT DETECTED Final   Coronavirus OC43 NOT DETECTED NOT DETECTED Final   Metapneumovirus NOT DETECTED NOT DETECTED Final   Rhinovirus / Enterovirus DETECTED (A) NOT DETECTED Final   Influenza A NOT DETECTED NOT DETECTED Final   Influenza A H1 NOT DETECTED NOT  DETECTED Final   Influenza A H1 2009 NOT DETECTED NOT DETECTED Final   Influenza A H3 NOT DETECTED NOT DETECTED Final   Influenza B NOT DETECTED NOT DETECTED Final   Parainfluenza Virus 1 NOT DETECTED NOT DETECTED Final   Parainfluenza Virus 2 NOT DETECTED NOT DETECTED Final   Parainfluenza Virus 3 NOT DETECTED NOT DETECTED Final   Parainfluenza Virus 4 NOT DETECTED NOT DETECTED Final   Respiratory Syncytial Virus NOT DETECTED NOT DETECTED Final   Bordetella pertussis NOT DETECTED NOT DETECTED Final   Chlamydophila pneumoniae NOT DETECTED NOT DETECTED Final   Mycoplasma pneumoniae NOT DETECTED NOT DETECTED Final         Radiology Studies: Dg Chest 2 View  Result Date: 11/13/2016 CLINICAL DATA:  Hypoxia EXAM: CHEST  2 VIEW COMPARISON:  11/05/2016 FINDINGS: Cardiac silhouette is mildly enlarged. There is vascular congestion and bilateral interstitial thickening similar to the prior exam. Additional linear lung base opacities noted consistent with atelectasis, also similar to the prior study. No evidence of pneumonia. There are stable calcified granuloma in the right mid lung associated with right hilar calcified nodes. No pleural effusion.  No pneumothorax. Skeletal structures are demineralized but grossly intact. IMPRESSION: 1. Mild congestive heart failure. Electronically Signed   By: Amie Portland M.D.   On: 11/13/2016 17:20        Scheduled Meds: . benzonatate  100 mg Oral BID  . clotrimazole   Topical BID  . dextromethorphan  15 mg Oral BID  . enoxaparin (LOVENOX) injection  120 mg Subcutaneous Q24H  . furosemide  120 mg Oral BID  . guaiFENesin  1,200 mg Oral BID  . hydrALAZINE  25 mg Oral TID  . insulin aspart  0-5 Units Subcutaneous QHS  . insulin aspart  0-9 Units Subcutaneous TID WC  . insulin glargine  12 Units Subcutaneous QHS  . isosorbide mononitrate  15 mg Oral Daily  . levothyroxine  25 mcg Oral QAC breakfast  . methylPREDNISolone (SOLU-MEDROL) injection  40 mg  Intravenous Daily  . sodium chloride flush  3 mL Intravenous Q12H  . Warfarin - Pharmacist Dosing Inpatient   Does not apply q1800   Continuous Infusions: . sodium chloride  LOS: 9 days      Alwyn Ren, MD [facility]   If 7PM-7AM, please contact night-coverage [email or web address] Password [facility] 11/14/2016, 3:45 PM

## 2016-11-14 NOTE — Progress Notes (Signed)
Progress Note  Patient Name: Sue Roach Date of Encounter: 11/14/2016  Primary Cardiologist: Dr. Herbie Baltimore   Subjective   Feels a bit weak but no other complaints. Breathing has improved.   Inpatient Medications    Scheduled Meds: . benzonatate  100 mg Oral BID  . clotrimazole   Topical BID  . dextromethorphan  15 mg Oral BID  . enoxaparin (LOVENOX) injection  120 mg Subcutaneous Q24H  . furosemide  120 mg Oral BID  . guaiFENesin  1,200 mg Oral BID  . hydrALAZINE  25 mg Oral TID  . insulin aspart  0-5 Units Subcutaneous QHS  . insulin aspart  0-9 Units Subcutaneous TID WC  . insulin glargine  12 Units Subcutaneous QHS  . levothyroxine  25 mcg Oral QAC breakfast  . methylPREDNISolone (SOLU-MEDROL) injection  40 mg Intravenous Daily  . sodium chloride flush  3 mL Intravenous Q12H  . Warfarin - Pharmacist Dosing Inpatient   Does not apply q1800   Continuous Infusions: . sodium chloride     PRN Meds: sodium chloride, acetaminophen, camphor-menthol, HYDROcodone-homatropine, ipratropium, levalbuterol, ondansetron (ZOFRAN) IV, sodium chloride flush   Vital Signs    Vitals:   11/13/16 1700 11/13/16 2016 11/14/16 0503 11/14/16 1134  BP: 140/80 131/73 (!) 141/55 (!) 146/59  Pulse:  61 (!) 59 (!) 51  Resp:  Temp:  98 F (36.7 C) 97.6 F (36.4 C) 97.7 F (36.5 C)  TempSrc:  Oral Oral Oral  SpO2:  94% 96% 98%  Weight:   244 lb 9.6 oz (110.9 kg)   Height:        Intake/Output Summary (Last 24 hours) at 11/14/16 1244 Last data filed at 11/14/16 0948  Gross per 24 hour  Intake             1440 ml  Output             3200 ml  Net            -1760 ml   Filed Weights   11/12/16 0402 11/13/16 0451 11/14/16 0503  Weight: 246 lb 7.6 oz (111.8 kg) 244 lb 14.4 oz (111.1 kg) 244 lb 9.6 oz (110.9 kg)    Telemetry    Atrial flutter with CVR in the upper 50s - low 60s - Personally Reviewed  ECG    Atrial flutter - Personally Reviewed  Physical Exam    GEN: No acute distress. Obese   Neck: No JVD Cardiac: irregular ryhthm, no murmurs, rubs, or gallops.  Respiratory: decreased BS at the bases bilaterally. GI: Soft, nontender, non-distended  MS: 1 + bilateral LEE; No deformity. Neuro:  Nonfocal  Psych: Normal affect   Labs    Chemistry Recent Labs Lab 11/08/16 0403 11/09/16 0231  11/12/16 1601 11/13/16 0839 11/14/16 0440  NA 134* 136  < > 139 141 142  K 5.1 5.1  < > 4.7 4.2 4.2  CL 100* 102  < > 96* 95* 98*  CO2 24 28  < > 33* 36* 36*  GLUCOSE 174* 136*  < > 169* 97 105*  BUN 97* 100*  < > 96* 94* 92*  CREATININE 2.20* 1.93*  < > 1.72* 1.72* 1.71*  CALCIUM 8.3* 8.5*  < > 8.4* 8.6* 8.1*  PROT 6.8 6.6  --   --   --   --   ALBUMIN 3.0* 3.1*  --   --   --   --   AST 34 27  --   --   --   --  ALT 40 44  --   --   --   --   ALKPHOS 78 77  --   --   --   --   BILITOT 0.7 0.9  --   --   --   --   GFRNONAA 20* 24*  < > 27* 27* 28*  GFRAA 24* 28*  < > 32* 32* 32*  ANIONGAP 10 6  < > < > = values in this interval not displayed.   Hematology  Recent Labs Lab 11/12/16 0515 11/13/16 0839 11/14/16 0440  WBC 7.5 10.1 7.0  RBC 4.17 4.65 4.25  HGB 10.8* 12.1 11.0*  HCT 36.4 40.7 37.0  MCV 87.3 87.5 87.1  MCH 25.9* 26.0 25.9*  MCHC 29.7* 29.7* 29.7*  RDW 18.8* 18.9* 18.9*  PLT 301 310 281    Cardiac EnzymesNo results for input(s): TROPONINI in the last 168 hours. No results for input(s): TROPIPOC in the last 168 hours.   BNPNo results for input(s): BNP, PROBNP in the last 168 hours.   DDimer No results for input(s): DDIMER in the last 168 hours.   Radiology    Dg Chest 2 View  Result Date: 11/13/2016 CLINICAL DATA:  Hypoxia EXAM: CHEST  2 VIEW COMPARISON:  11/05/2016 FINDINGS: Cardiac silhouette is mildly enlarged. There is vascular congestion and bilateral interstitial thickening similar to the prior exam. Additional linear lung base opacities noted consistent with atelectasis, also similar to the prior  study. No evidence of pneumonia. There are stable calcified granuloma in the right mid lung associated with right hilar calcified nodes. No pleural effusion.  No pneumothorax. Skeletal structures are demineralized but grossly intact. IMPRESSION: 1. Mild congestive heart failure. Electronically Signed   By: Amie Portland M.D.   On: 11/13/2016 17:20    Cardiac Studies   2D echo 11/07/16 Study Conclusions  - Left ventricle: The cavity size was mildly dilated. Wall   thickness was normal. Systolic function was normal. The estimated   ejection fraction was in the range of 55% to 60%. Wall motion was   normal; there were no regional wall motion abnormalities. - Ventricular septum: The contour showed diastolic flattening and   systolic flattening. These changes are consistent with RV volume   and pressure overload. - Mitral valve: There was mild regurgitation. Valve area by   continuity equation (using LVOT flow): 2.43 cm^2. - Left atrium: The atrium was severely dilated. - Right ventricle: The cavity size was moderately dilated. Systolic   function was moderately reduced. - Right atrium: The atrium was severely dilated. - Tricuspid valve: There was malcoaptation of the valve leaflets.   There was moderate-severe regurgitation directed centrally.   Diastolic regurgitation was present. - Pulmonary arteries: Systolic pressure was moderately increased.   PA peak pressure: 58 mm Hg (S).    Patient Profile     76 y.o. female with a hx of chronic diastolic HF, COPD, Pulmonary HTN, DM, essential HTN and CKD admitted for acute on chronic HF and new onset atrial flutter. 2D Echo with normal LVEF however right sided HF w/ severely dilated RA, moderately dilated RV and moderately reduced systolic function. PA pressure 58 mm Hg.   Assessment & Plan    1. Acute on Chronic Right Sided HF: LVEF preserved. RV dysfunction as noted above. Still with mild LEE on exam. Continue lasix. Nephrology is following  and has recommended transition to PO  Lasix today, 120 mg BID. F/u BMP in the am.  Low sodium diet advised. Fluid restriction per nephrology.   2. New Onset Atrial Flutter:  In the setting of pulmonary disease  (COPD and pulmonary HTN). She was on a BB but was discontinued due to bradycardia. Rate is currently controlled in the upper 50s-low 60s w/o any agents currently. On Coumadin for anticoagulation. INR close to being therapeutic. INR 1.97 today. Both her right and left atria are severely dilated, thus likely would not do well with cardioversion. She would prefer to have her INR followed at PCP.   2. COPD: on supplemental O2.   3. CKD: management per nephrology. Appreciate their assistance with managing diuretics.   For questions or updates, please contact [facility] Please consult [email or web address] for contact info under Cardiology/STEMI.   Signed, Tobias Alexander, MD  11/14/2016, 12:44 PM    The patient was seen, examined and discussed with Brittainy M. Sharol Harness, PA-C and I agree with the above.   The patient is still fluid overloaded and oxygen dependant, she walked with PT and felt SOB. Nephrology recommended lasix 120 mg PO BID. She is hypertensive, I will add Imdur 15 mg po daily. She remains in a-flutter with good rate control. On warfarin, INR 1.97. Pharmacy follows.  Tobias Alexander, MD 11/14/2016

## 2016-11-14 NOTE — Progress Notes (Signed)
ANTICOAGULATION CONSULT NOTE - Follow-up Consult  Pharmacy Consult for warfarin Indication: atrial fibrillation  Allergies  Allergen Reactions  . Sulfa Antibiotics Other (See Comments)    unknown  . Coreg [Carvedilol] Rash    Hair loss  . Zinc Rash   Patient Measurements: Height: 5' (152.4 cm) Weight: 244 lb 9.6 oz (110.9 kg) IBW/kg (Calculated) : 45.5  Vital Signs: Temp: 97.6 F (36.4 C) (09/24 0503) Temp Source: Oral (09/24 0503) BP: 141/55 (09/24 0503) Pulse Rate: 59 (09/24 0503)  Labs:  Recent Labs  11/12/16 0515 11/12/16 1601 11/13/16 0839 11/14/16 0440  HGB 10.8*  --  12.1 11.0*  HCT 36.4  --  40.7 37.0  PLT 301  --  310 281  LABPROT 16.4*  --  18.5* 22.3*  INR 1.33  --  1.55 1.97  CREATININE 1.66* 1.72* 1.72* 1.71*   Estimated Creatinine Clearance: 31.2 mL/min (A) (by C-G formula based on SCr of 1.71 mg/dL (H)).  Medical History: Past Medical History:  Diagnosis Date  . CHF (congestive heart failure) (HCC)   . Diabetes mellitus without complication (HCC)   . Hypertension   . Kidney disease   . Renal disorder   . Shingles    Medications:  Prescriptions Prior to Admission  Medication Sig Dispense Refill Last Dose  . acetaminophen (TYLENOL) 325 MG tablet Take 2 tablets (650 mg total) by mouth every 4 (four) hours as needed for mild pain, moderate pain, fever or headache.   unknown at PRN  . aspirin EC 81 MG tablet Take 81 mg by mouth daily.   11/04/2016 at Unknown time  . carvedilol (COREG) 3.125 MG tablet Take 3.125 mg by mouth 2 (two) times daily.  0 11/04/2016 at 0600  . docusate sodium (COLACE) 100 MG capsule Take 100 mg by mouth 2 (two) times daily as needed for constipation.   UNKNOWN at prn  . furosemide (LASIX) 80 MG tablet Take 1.5 tablets (120 mg total) by mouth 2 (two) times daily. (Patient taking differently: Take 160 mg by mouth 2 (two) times daily. )   11/04/2016 at Unknown time  . glimepiride (AMARYL) 4 MG tablet Take 4 mg by mouth daily.  1  11/04/2016 at Unknown time  . hydrALAZINE (APRESOLINE) 25 MG tablet Take 25 mg by mouth 3 (three) times daily.  0 11/04/2016 at Unknown time  . pioglitazone (ACTOS) 45 MG tablet Take 45 mg by mouth daily.  0 11/04/2016 at Unknown time  . VENTOLIN HFA 108 (90 Base) MCG/ACT inhaler Inhale 2 puffs into the lungs daily.  1 11/04/2016 at Unknown time    Assessment: Sue Roach a 76 y.o.female with CKD found to have atrial fibrillation. Initially started on Eliquis, but patient cannot afford long term and asked to transition to warfarin. Lovenox will be used until INR therapeutic. Last dose of Eliquis ~1000 on 9/19.     Goal of Therapy:  INR 2-3 Monitor platelets by anticoagulation protocol: Yes   Plan:  Warfarin 10 mg po x 1 Lovenox  once daily Monitor renal function, INR, CBC and for s/sx of bleeding  Thank you Okey Regal, PharmD [phone] 11/14/2016 10:45 AM

## 2016-11-14 NOTE — Progress Notes (Signed)
Admit: 11/04/2016 LOS: 9  25F with AoCKD3 and Right sided sCHF exacerbation with dyspnea, cough, hypervolemia, likely exacerbated by COPD + untreated OSA and noncompliance  Subjective:  Weight further down- total of 20 pounds down  Stable GFR No new issues No c/o this AM- feels better   09/23 0701 - 09/24 0700 In: 1680 [P.O.:1680] Out: 3700 [Urine:3700]  Filed Weights   11/12/16 0402 11/13/16 0451 11/14/16 0503  Weight: 111.8 kg (246 lb 7.6 oz) 111.1 kg (244 lb 14.4 oz) 110.9 kg (244 lb 9.6 oz)    Scheduled Meds: . benzonatate  100 mg Oral BID  . clotrimazole   Topical BID  . dextromethorphan  15 mg Oral BID  . enoxaparin (LOVENOX) injection  120 mg Subcutaneous Q24H  . guaiFENesin  1,200 mg Oral BID  . hydrALAZINE  25 mg Oral TID  . insulin aspart  0-5 Units Subcutaneous QHS  . insulin aspart  0-9 Units Subcutaneous TID WC  . insulin glargine  12 Units Subcutaneous QHS  . levothyroxine  25 mcg Oral QAC breakfast  . methylPREDNISolone (SOLU-MEDROL) injection  40 mg Intravenous Daily  . sodium chloride flush  3 mL Intravenous Q12H  . Warfarin - Pharmacist Dosing Inpatient   Does not apply q1800   Continuous Infusions: . sodium chloride    . furosemide Stopped (11/13/16 1859)   PRN Meds:.sodium chloride, acetaminophen, camphor-menthol, HYDROcodone-homatropine, ipratropium, levalbuterol, ondansetron (ZOFRAN) IV, sodium chloride flush  Current Labs: reviewed    Physical Exam:  Blood pressure (!) 141/55, pulse (!) 59, temperature 97.6 F (36.4 C), temperature source Oral, resp. rate 18, height 5' (1.524 m), weight 110.9 kg (244 lb 9.6 oz), SpO2 96 %. GEN: Chronically ill-appearing, sitting in chair, using nasal cannula  ENT: NCAT  EYES: EOMI  CV: Regular, no murmurs  PULM: Diminished breath sounds in the bases especially but throughout,  ABD: Soft, nontender  SKIN: No rashes or lesions  EXT: 2+edema in both legs into the knees  A 1. AoCKD3, related to decompensated R  sided sCHF + diuretics; improving/resolved- follows with Dr. Hyman Hopes as OP  2. R sided CHF exacerbation; hypervolemia improving 3. Presumed OSA, untreated 4. 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 Ongoing tobacco user 6. DM2 7. Hypothyroidism 8. HTN 9. AFlutter to rec warfarin 10. Anemia  P 1. Cont BID Lasix for now-  transtion to PO today- she says was on 120 BID recently inc to 120 TID.  Will start with 120 BID and go from there  2.  BMP ijn AM 3. Continue fluid restriction 4. I think will need closer monitoring as OP in order to keep out of trouble   Sue Roach A  11/14/2016, 10:09 AM   Recent Labs Lab 11/08/16 0403 11/09/16 0231  11/12/16 1601 11/13/16 0839 11/14/16 0440  NA 134* 136  < > 139 141 142  K 5.1 5.1  < > 4.7 4.2 4.2  CL 100* 102  < > 96* 95* 98*  CO2 24 28  < > 33* 36* 36*  GLUCOSE 174* 136*  < > 169* 97 105*  BUN 97* 100*  < > 96* 94* 92*  CREATININE 2.20* 1.93*  < > 1.72* 1.72* 1.71*  CALCIUM 8.3* 8.5*  < > 8.4* 8.6* 8.1*  PHOS 5.1* 4.0  --   --   --   --   < > = values in this interval not displayed.  Recent Labs Lab 11/08/16 0403 11/09/16 0231  11/12/16 0515 11/13/16 0839 11/14/16 0440  WBC 9.0  7.9  < > 7.5 10.1 7.0  NEUTROABS 8.1* 7.0  --   --   --   --   HGB 9.6* 9.8*  < > 10.8* 12.1 11.0*  HCT 32.4* 33.2*  < > 36.4 40.7 37.0  MCV 87.3 87.6  < > 87.3 87.5 87.1  PLT 274 276  < > 301 310 281  < > = values in this interval not displayed.

## 2016-11-14 NOTE — Progress Notes (Signed)
Physical Therapy Treatment Patient Details Name: Sue Roach MRN: [id number] DOB: 06-11-1940 Today's Date: 11/14/2016    History of Present Illness 76 y.o. female with DM, HTN, CKD, and R sided CHF      PT Comments    Patient required supervision/min guard overall for mobility this session. SpO2 desat to 81% on RA and required 4L O2 via [place]. Pt c/o dizziness with mobility but improved with use of supplemental O2. Continue to progress as tolerated with anticipated d/c home with HHPT.    Follow Up Recommendations  Home health PT     Equipment Recommendations  None recommended by PT    Recommendations for Other Services OT consult     Precautions / Restrictions Precautions Precautions: Fall Restrictions Weight Bearing Restrictions: No    Mobility  Bed Mobility               General bed mobility comments: pt OOB in chair upon arrival  Transfers Overall transfer level: Modified independent Equipment used: Rolling walker (2 wheeled);None Transfers: Sit to/from Stand           General transfer comment: pt is able to stand without assist and without AD however is more steady with AD for dynamic activities   Ambulation/Gait Ambulation/Gait assistance: Supervision;Min guard Ambulation Distance (Feet): 200 Feet Assistive device: Rolling walker (2 wheeled) Gait Pattern/deviations: Step-through pattern;Trunk flexed;Shuffle Gait velocity: decreased   General Gait Details: pt required 4L O2 via  with mobility; pt with c/o dizziness but improved with use of supplemental O2; cues for posture and increased bilat step lengths and heel strike   Stairs Stairs: Yes   Stair Management: One rail Right;Step to pattern;Forwards Number of Stairs: 10 General stair comments: min guard for safety  Wheelchair Mobility    Modified Rankin (Stroke Patients Only)       Balance Overall balance assessment: Needs assistance Sitting-balance support: No upper extremity  supported Sitting balance-Leahy Scale: Good     Standing balance support: Single extremity supported Standing balance-Leahy Scale: Fair Standing balance comment: unsteady without UE support                            Cognition Arousal/Alertness: Awake/alert Behavior During Therapy: WFL for tasks assessed/performed Overall Cognitive Status: Within Functional Limits for tasks assessed                                        Exercises      General Comments General comments (skin integrity, edema, etc.): SpO2 desat to 81% on RA and up to 92% on 4L O2 via       Pertinent Vitals/Pain Pain Assessment: Faces Faces Pain Scale: Hurts a little bit Pain Location: back (chronic) Pain Descriptors / Indicators: Discomfort;Aching Pain Intervention(s): Limited activity within patient's tolerance;Monitored during session;Repositioned    Home Living                      Prior Function            PT Goals (current goals can now be found in the care plan section) Acute Rehab PT Goals PT Goal Formulation: With patient Time For Goal Achievement: 11/26/16 Potential to Achieve Goals: Fair    Frequency    Min 3X/week      PT Plan Current plan remains appropriate    Co-evaluation  AM-PAC PT "6 Clicks" Daily Activity  Outcome Measure  Difficulty turning over in bed (including adjusting bedclothes, sheets and blankets)?: A Little Difficulty moving from lying on back to sitting on the side of the bed? : A Little Difficulty sitting down on and standing up from a chair with arms (e.g., wheelchair, bedside commode, etc,.)?: A Little Help needed moving to and from a bed to chair (including a wheelchair)?: A Little Help needed walking in hospital room?: A Little Help needed climbing 3-5 steps with a railing? : A Little 6 Click Score: 18    End of Session Equipment Utilized During Treatment: Gait belt Activity Tolerance: Patient  tolerated treatment well Patient left: in chair;with call bell/phone within reach Nurse Communication: Mobility status PT Visit Diagnosis: Dizziness and giddiness (R42);Difficulty in walking, not elsewhere classified (R26.2);Muscle weakness (generalized) (M62.81)     Time:  191-478    Charges:   Gait training 23-37 mins                    G Codes:       Erline Levine, PTA Pager: [phone]     Carolynne Edouard 11/14/2016, 9:34 AM

## 2016-11-14 NOTE — Care Management Important Message (Signed)
Important Message  Patient Details  Name: Sue Roach MRN: [id number] Date of Birth: [birth year]   Medicare Important Message Given:  Yes    Ragina Fenter Stefan Church 11/14/2016, 3:38 PM

## 2016-11-15 ENCOUNTER — Inpatient Hospital Stay (HOSPITAL_COMMUNITY): Payer: Medicare Other

## 2016-11-15 DIAGNOSIS — J441 Chronic obstructive pulmonary disease with (acute) exacerbation: Secondary | ICD-10-CM

## 2016-11-15 LAB — GLUCOSE, CAPILLARY
GLUCOSE-CAPILLARY: 212 mg/dL — AB (ref 65–99)
GLUCOSE-CAPILLARY: 130 mg/dL — AB (ref 65–99)
Glucose-Capillary: 114 mg/dL — ABNORMAL HIGH (ref 65–99)
Glucose-Capillary: 164 mg/dL — ABNORMAL HIGH (ref 65–99)
Glucose-Capillary: 84 mg/dL (ref 65–99)
Glucose-Capillary: 180 mg/dL — ABNORMAL HIGH (ref 65–99)

## 2016-11-15 LAB — CBC
HCT: 37.6 % (ref 36.0–46.0)
Hemoglobin: 11.1 g/dL — ABNORMAL LOW (ref 12.0–15.0)
MCH: 25.8 pg — AB (ref 26.0–34.0)
MCHC: 29.5 g/dL — ABNORMAL LOW (ref 30.0–36.0)
MCV: 87.2 fL (ref 78.0–100.0)
PLATELETS: 283 10*3/uL (ref 150–400)
RBC: 4.31 MIL/uL (ref 3.87–5.11)
RDW: 18.8 % — ABNORMAL HIGH (ref 11.5–15.5)
WBC: 8.3 10*3/uL (ref 4.0–10.5)

## 2016-11-15 LAB — RENAL FUNCTION PANEL
Albumin: 2.9 g/dL — ABNORMAL LOW (ref 3.5–5.0)
Anion gap: 10 (ref 5–15)
BUN: 80 mg/dL — ABNORMAL HIGH (ref 6–20)
CALCIUM: 8.2 mg/dL — AB (ref 8.9–10.3)
CO2: 36 mmol/L — AB (ref 22–32)
CREATININE: 1.49 mg/dL — AB (ref 0.44–1.00)
Chloride: 93 mmol/L — ABNORMAL LOW (ref 101–111)
GFR calc non Af Amer: 33 mL/min — ABNORMAL LOW (ref >60)
GFR, EST AFRICAN AMERICAN: 38 mL/min — AB (ref >60)
Glucose, Bld: 93 mg/dL (ref 65–99)
Phosphorus: 3.9 mg/dL (ref 2.5–4.6)
Potassium: 4.4 mmol/L (ref 3.5–5.1)
SODIUM: 139 mmol/L (ref 135–145)

## 2016-11-15 LAB — PROTIME-INR
INR: 1.79
PROTHROMBIN TIME: 20.7 s — AB (ref 11.4–15.2)

## 2016-11-15 MED ORDER — WARFARIN SODIUM 10 MG PO TABS
10.0000 mg | ORAL_TABLET | Freq: Once | ORAL | Status: AC
  Administered 2016-11-15: 10 mg via ORAL
  Filled 2016-11-15: qty 1

## 2016-11-15 MED ORDER — ISOSORBIDE MONONITRATE ER 30 MG PO TB24
30.0000 mg | ORAL_TABLET | Freq: Every day | ORAL | Status: DC
  Administered 2016-11-16: 30 mg via ORAL
  Filled 2016-11-15: qty 1

## 2016-11-15 MED ORDER — IPRATROPIUM BROMIDE 0.02 % IN SOLN
0.5000 mg | Freq: Four times a day (QID) | RESPIRATORY_TRACT | Status: DC
  Administered 2016-11-15 (×2): 0.5 mg via RESPIRATORY_TRACT
  Filled 2016-11-15 (×2): qty 2.5

## 2016-11-15 MED ORDER — LEVALBUTEROL HCL 0.63 MG/3ML IN NEBU: 0.6300 mg | INHALATION_SOLUTION | Freq: Four times a day (QID) | RESPIRATORY_TRACT | Status: DC | PRN

## 2016-11-15 MED ORDER — IPRATROPIUM-ALBUTEROL 0.5-2.5 (3) MG/3ML IN SOLN
3.0000 mL | Freq: Three times a day (TID) | RESPIRATORY_TRACT | Status: DC
  Administered 2016-11-16: 3 mL via RESPIRATORY_TRACT
  Filled 2016-11-15: qty 3

## 2016-11-15 NOTE — Progress Notes (Signed)
ANTICOAGULATION CONSULT NOTE - Follow-up Consult  Pharmacy Consult for warfarin Indication: atrial fibrillation  Allergies  Allergen Reactions  . Sulfa Antibiotics Other (See Comments)    unknown  . Coreg [Carvedilol] Rash    Hair loss  . Zinc Rash   Patient Measurements: Height: 5' (152.4 cm) Weight: 245 lb 11.2 oz (111.4 kg) IBW/kg (Calculated) : 45.5  Vital Signs: Temp: 97.6 F (36.4 C) (09/25 0506) Temp Source: Oral (09/25 0506) BP: 137/66 (09/25 0506)  Labs:  Recent Labs  11/13/16 0839 11/14/16 0440 11/15/16 0606  HGB 12.1 11.0* 11.1*  HCT 40.7 37.0 37.6  PLT 310 281 283  LABPROT 18.5* 22.3* 20.7*  INR 1.55 1.97 1.79  CREATININE 1.72* 1.71* 1.49*   Estimated Creatinine Clearance: 35.9 mL/min (A) (by C-G formula based on SCr of 1.49 mg/dL (H)).  Medical History: Past Medical History:  Diagnosis Date  . CHF (congestive heart failure) (HCC)   . Diabetes mellitus without complication (HCC)   . Hypertension   . Kidney disease   . Renal disorder   . Shingles    Medications:  Prescriptions Prior to Admission  Medication Sig Dispense Refill Last Dose  . acetaminophen (TYLENOL) 325 MG tablet Take 2 tablets (650 mg total) by mouth every 4 (four) hours as needed for mild pain, moderate pain, fever or headache.   unknown at PRN  . aspirin EC 81 MG tablet Take 81 mg by mouth daily.   11/04/2016 at Unknown time  . carvedilol (COREG) 3.125 MG tablet Take 3.125 mg by mouth 2 (two) times daily.  0 11/04/2016 at 0600  . docusate sodium (COLACE) 100 MG capsule Take 100 mg by mouth 2 (two) times daily as needed for constipation.   UNKNOWN at prn  . furosemide (LASIX) 80 MG tablet Take 1.5 tablets (120 mg total) by mouth 2 (two) times daily. (Patient taking differently: Take 160 mg by mouth 2 (two) times daily. )   11/04/2016 at Unknown time  . glimepiride (AMARYL) 4 MG tablet Take 4 mg by mouth daily.  1 11/04/2016 at Unknown time  . hydrALAZINE (APRESOLINE) 25 MG tablet Take  25 mg by mouth 3 (three) times daily.  0 11/04/2016 at Unknown time  . pioglitazone (ACTOS) 45 MG tablet Take 45 mg by mouth daily.  0 11/04/2016 at Unknown time  . VENTOLIN HFA 108 (90 Base) MCG/ACT inhaler Inhale 2 puffs into the lungs daily.  1 11/04/2016 at Unknown time    Assessment: Sue Roach a 76 y.o.female with CKD found to have atrial fibrillation. Initially started on Eliquis, but patient cannot afford long term and asked to transition to warfarin. Lovenox will be used until INR therapeutic. Last dose of Eliquis ~1000 on 9/19.  INR = 1.79   Goal of Therapy:  INR 2-3 Monitor platelets by anticoagulation protocol: Yes   Plan:  Warfarin 10 mg po x 1 Lovenox  once daily Monitor renal function, INR, CBC and for s/sx of bleeding  Thank you Okey Regal, PharmD [phone] 11/15/2016 9:07 AM

## 2016-11-15 NOTE — Progress Notes (Signed)
PT Cancellation Note  PaRome Schlauch: Jaylie Calvert-Morse MRN: [id number] DOB: [birth year]   Cancelled Treatment:    Reason Eval/Treat Not Completed: Patient declined, no reason specified Patient declined due to feeling dizzy and with upset stomach. PT will check on pt later as time allows.    Derek Mound, PTA Pager: [phone]   11/15/2016, 9:08 AM

## 2016-11-15 NOTE — Progress Notes (Signed)
Physical Therapy Treatment Patient Details Name: Sue Roach MRN: [id number] DOB: [birth year] Today's Date: 11/15/2016    History of Present Illness 76 y.o. female with DM, HTN, CKD, and R sided CHF      PT Comments    Patient limited by fatigue this session. Pt required 4L O2 via [place] to maintain SpO2 90%. Pt will continue to benefit from skilled PT in acute setting and at home and may need [facility] aide as her bathroom is on second level of home.    Follow Up Recommendations  Home health PT     Equipment Recommendations  None recommended by PT    Recommendations for Other Services OT consult     Precautions / Restrictions Precautions Precautions: Fall Restrictions Weight Bearing Restrictions: No    Mobility  Bed Mobility               General bed mobility comments: pt standing at sink with bilat UE support upon arrival  Transfers Overall transfer level: Modified independent Equipment used: Rolling walker (2 wheeled);None Transfers: Sit to/from Stand Sit to Stand: Supervision         General transfer comment: supervision for safety  Ambulation/Gait Ambulation/Gait assistance: Min guard Ambulation Distance (Feet): 120 Feet Assistive device: Rolling walker (2 wheeled) Gait Pattern/deviations: Step-through pattern;Trunk flexed;Shuffle     General Gait Details: 3 brief standing rest breaks; cues for posture and safe use of AD; 4L O2 via [place] with SpO2 90%   Stairs            Wheelchair Mobility    Modified Rankin (Stroke Patients Only)       Balance Overall balance assessment: Needs assistance Sitting-balance support: No upper extremity supported Sitting balance-Leahy Scale: Good     Standing balance support: Single extremity supported Standing balance-Leahy Scale: Fair Standing balance comment: unsteady without UE support                            Cognition Arousal/Alertness: Awake/alert Behavior During Therapy: WFL for  tasks assessed/performed Overall Cognitive Status: Within Functional Limits for tasks assessed                                        Exercises      General Comments        Pertinent Vitals/Pain Pain Assessment: No/denies pain Pain Intervention(s): Monitored during session    Home Living                      Prior Function            PT Goals (current goals can now be found in the care plan section) Acute Rehab PT Goals PT Goal Formulation: With patient Time For Goal Achievement: 11/26/16 Potential to Achieve Goals: Fair Progress towards PT goals: Progressing toward goals    Frequency    Min 3X/week      PT Plan Current plan remains appropriate    Co-evaluation              AM-PAC PT "6 Clicks" Daily Activity  Outcome Measure  Difficulty turning over in bed (including adjusting bedclothes, sheets and blankets)?: A Little Difficulty moving from lying on back to sitting on the side of the bed? : A Little Difficulty sitting down on and standing up from a chair with arms (e.g., wheelchair, bedside  commode, etc,.)?: A Little Help needed moving to and from a bed to chair (including a wheelchair)?: A Little Help needed walking in hospital room?: A Little Help needed climbing 3-5 steps with a railing? : A Little 6 Click Score: 18    End of Session Equipment Utilized During Treatment: Gait belt Activity Tolerance: Patient limited by fatigue Patient left: in chair;with call bell/phone within reach Nurse Communication: Mobility status PT Visit Diagnosis: Dizziness and giddiness (R42);Difficulty in walking, not elsewhere classified (R26.2);Muscle weakness (generalized) (M62.81)     Time: 0981-1914 PT Time Calculation (min) (ACUTE ONLY): 14 min  Charges:  $Gait Training: 8-22 mins                    G Codes:       Erline Levine, PTA Pager: [phone]     Carolynne Edouard 11/15/2016, 4:27 PM

## 2016-11-15 NOTE — Progress Notes (Signed)
Pt is confused about time and new medications. Guarded with care .Vitals stable. Down with transport for chest  Xray.

## 2016-11-15 NOTE — Progress Notes (Signed)
Admit: 11/04/2016 LOS: 10  36F with AoCKD3 and Right sided sCHF exacerbation with dyspnea, cough, hypervolemia, likely exacerbated by COPD + untreated OSA and noncompliance  Subjective:  Weight stable - made 1800 of urine after dosing changed to PO Stable to improved GFR No new issues No c/o this AM- feels better   09/24 0701 - 09/25 0700 In: 600 [P.O.:600] Out: 1800 [Urine:1800]  Filed Weights   11/13/16 0451 11/14/16 0503 11/15/16 0506  Weight: 111.1 kg (244 lb 14.4 oz) 110.9 kg (244 lb 9.6 oz) 111.4 kg (245 lb 11.2 oz)    Scheduled Meds: . benzonatate  100 mg Oral BID  . clotrimazole   Topical BID  . dextromethorphan  15 mg Oral BID  . enoxaparin (LOVENOX) injection  120 mg Subcutaneous Q24H  . furosemide  120 mg Oral BID  . guaiFENesin  1,200 mg Oral BID  . hydrALAZINE  25 mg Oral TID  . insulin aspart  0-5 Units Subcutaneous QHS  . insulin aspart  0-9 Units Subcutaneous TID WC  . insulin glargine  12 Units Subcutaneous QHS  . isosorbide mononitrate  15 mg Oral Daily  . levothyroxine  25 mcg Oral QAC breakfast  . predniSONE  20 mg Oral Q breakfast  . sodium chloride flush  3 mL Intravenous Q12H  . warfarin  10 mg Oral ONCE-1800  . Warfarin - Pharmacist Dosing Inpatient   Does not apply q1800   Continuous Infusions: . sodium chloride     PRN Meds:.sodium chloride, acetaminophen, camphor-menthol, HYDROcodone-homatropine, ipratropium, levalbuterol, ondansetron (ZOFRAN) IV, sodium chloride flush  Current Labs: reviewed    Physical Exam:  Blood pressure 137/66, pulse 64, temperature 97.6 F (36.4 C), temperature source Oral, resp. rate 20, height 5' (1.524 m), weight 111.4 kg (245 lb 11.2 oz), SpO2 94 %. GEN: Chronically ill-appearing, sitting in chair, using nasal cannula  ENT: NCAT  EYES: EOMI  CV: Regular, no murmurs  PULM: Diminished breath sounds in the bases especially but throughout,  ABD: Soft, nontender  SKIN: No rashes or lesions  EXT: 2+edema in both  legs into the knees  A 1. AoCKD3, related to decompensated R sided sCHF + diuretics; improving/resolved- follows with Dr. Hyman Hopes as OP  2. R sided CHF exacerbation; hypervolemia improving 3. Presumed OSA, untreated 4. 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 Ongoing tobacco user 6. DM2 7. Hypothyroidism 8. HTN 9. AFlutter to rec warfarin 10. Anemia  P 1. Cont BID Lasix for now-  transtioned to PO - she says was on 120 BID recently inc to 120 TID.  Currently on  120 BID - since GFR is improving suspect she will be more responsive so will keep dose the same.  I am really ok with discharge home whenever cleared by other services-  If weight goes up again tomorrow or the next couple of days, could increase dose of lasix to 160 BID.       2. Continue fluid restriction 3. I think will need closer monitoring as OP in order to keep out of trouble   Renal is going to sign off due to high consult volume.... See recs above for lasix.  I will make arrangements for her to follow up with Dr. Hulen Skains A  11/15/2016, 9:19 AM   Recent Labs Lab 11/09/16 0231  11/13/16 0839 11/14/16 0440 11/15/16 0606  NA 136  < > 141 142 139  K 5.1  < > 4.2 4.2 4.4  CL 102  < > 95* 98* 93*  CO2 28  < > 36* 36* 36*  GLUCOSE 136*  < > 97 105* 93  BUN 100*  < > 94* 92* 80*  CREATININE 1.93*  < > 1.72* 1.71* 1.49*  CALCIUM 8.5*  < > 8.6* 8.1* 8.2*  PHOS 4.0  --   --   --  3.9  < > = values in this interval not displayed.  Recent Labs Lab 11/09/16 0231  11/13/16 0839 11/14/16 0440 11/15/16 0606  WBC 7.9  < > 10.1 7.0 8.3  NEUTROABS 7.0  --   --   --   --   HGB 9.8*  < > 12.1 11.0* 11.1*  HCT 33.2*  < > 40.7 37.0 37.6  MCV 87.6  < > 87.5 87.1 87.2  PLT 276  < > 310 281 283  < > = values in this interval not displayed.

## 2016-11-15 NOTE — Progress Notes (Signed)
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 Patient reports that 3 days ago she developed a scratchy, sore throat with a headache and persistent coughing of clear mucoid sputum. No fevers no chills. No significant change in chronic lower extremity edema. She did use a prescribed cream on her left lower extremity yesterday but her skin began to burn and she removed the cream and noticed a residual redness/erythema after removal of the cream. She went to her PCP office today for she was found to be hypoxemic. She does not wear chronic oxygen. Upon presentation to the ER on room air her saturations were 60%. Chest x-ray revealed early edema without effusion and cardiomegaly. Patient continued to refuse bipap.she was tried on high dose lasix 120 mg tid with good response.lasix dose has been changed to bid.beta blocker was stopped due to bradycardia.was treated with doxycycline. Patient has been treated with IV Lasix 120 mg 3 times a day. She has been sufficient to by mouth Lasix 120 mg twice a day starting today. She was also treated with steroids and doxycycline. Patient is up in her chair denies any new complaints today she feels of breathing is better.A chest x-ray that was done 9/23 showed findings consistent with mild CHF.    Assessment & Plan:   Principal Problem:   Acute on chronic right-sided congestive heart failure (HCC) Active Problems:   Diabetes mellitus without complication (HCC)   Hypertension, uncontrolled   Hypothyroidism, adult   Acute respiratory failure with hypoxemia (HCC)   CKD (chronic kidney disease) stage 4, GFR 15-29 ml/min (HCC)   Chronic anemia  Atrial flutter (HCC)  CHF responding to high dose lasix by mouth today. Chest x-ray shows mild CHF. Nephrology following and cardiology following. Repeat Xr in AM Aflutter/brady ekg.dc beta blocker.cards following.continue coumadin per pharmacy. CKD renal function stable.FOLLOWRENAL FUNCTIONS DAILY. DM CONTINUE LANTUS. SSI HTNPATIENT IS ON lASIX IMDUR,HYDRALAZINE HYPOTHYROIDSM CONTINUE SYNTHROID Copd/PNEUMONIA STATUS POST TREATMENT WITH DOXYCYCLINE. sOLU-mEDROL HAS BEEN dc'D TODAY. sTART PREDNISONE TAPER. Breathign tx to open patient up. Along with mucinex.  DVT prophylaxis:coumadin Code Status:full Family Communication:  Disposition Plan: i would think patient would be able to be discharged later this week if all goes well.     DVT prophylaxis: LOVENOX AND cOUMADIN Code Status:FULL Family Communication: NONE Disposition Plan: DISCHARGE HOME PROBABLY LATER PART OF THIS WEEK. oN cOUMADIN.   Consultants: NEPHROLOGY, CARDIOLOGY  Procedures: NONE  Antimicrobials: NONE   Subjective:SITTING UP IN CHAIR FEELS BREATHING IS BETTERDENIES ANY MORE DIZZINESS.   Objective: Vitals:   11/14/16 1615 11/14/16 2020 11/15/16 0506 11/15/16 0956  BP: (!) 135/56 (!) 141/73 137/66 (!) 168/64  Pulse:  64  (!) 57  Resp:  20 20   Temp:  98.7 F (37.1 C) 97.6 F (36.4 C) (!) 97.4 F (36.3 C)  TempSrc:  Oral Oral Oral  SpO2:  98% 94% 94%  Weight:   111.4 kg (245 lb 11.2 oz)   Height:        Intake/Output Summary (Last 24 hours) at 11/15/16 1055 Last data filed at 11/15/16 0921  Gross per 24 hour  Intake              480 ml  Output  1600 ml  Net            -1120 ml   Filed Weights   11/13/16 0451 11/14/16 0503 11/15/16 0506  Weight: 111.1 kg (244 lb 14.4 oz) 110.9 kg (244 lb 9.6 oz) 111.4 kg (245 lb 11.2 oz)    Examination:  General exam: Appears calm and comfortable; A&Ox3 Respiratory system: DECREASED BREATH SOUNDS AT THE BASES.Marland Kitchen Respiratory effort  normal. Cardiovascular system: S1 & S2 heard, RRR. No JVD, murmurs, rubs, gallops or clicks. No pedal edema. Gastrointestinal system: Abdomen is nondistended, soft and nontender. No organomegaly or masses felt. Normal bowel sounds heard. Central nervous system: Alert and oriented. No focal neurological deficits. Extremities: Symmetric 5 x 5 power. Skin: No rashes, lesions or ulcers Psychiatry: Judgement and insight appear normal. Mood & affect appropriate.     Data Reviewed: I have personally reviewed following labs and imaging studies  CBC:  Recent Labs Lab 11/09/16 0231  11/11/16 0418 11/12/16 0515 11/13/16 0839 11/14/16 0440 11/15/16 0606  WBC 7.9  < > 8.5 7.5 10.1 7.0 8.3  NEUTROABS 7.0  --   --   --   --   --   --   HGB 9.8*  < > 10.8* 10.8* 12.1 11.0* 11.1*  HCT 33.2*  < > 36.0 36.4 40.7 37.0 37.6  MCV 87.6  < > 87.0 87.3 87.5 87.1 87.2  PLT 276  < > 316 301 310 281 283  < > = values in this interval not displayed. Basic Metabolic Panel:  Recent Labs Lab 11/09/16 0231  11/12/16 0515 11/12/16 1601 11/13/16 0839 11/14/16 0440 11/15/16 0606  NA 136  < > 140 139 141 142 139  K 5.1  < > 4.5 4.7 4.2 4.2 4.4  CL 102  < > 98* 96* 95* 98* 93*  CO2 28  < > 33* 33* 36* 36* 36*  GLUCOSE 136*  < > 150* 169* 97 105* 93  BUN 100*  < > 95* 96* 94* 92* 80*  CREATININE 1.93*  < > 1.66* 1.72* 1.72* 1.71* 1.49*  CALCIUM 8.5*  < > 8.2* 8.4* 8.6* 8.1* 8.2*  MG 2.7*  --   --   --   --   --   --   PHOS 4.0  --   --   --   --   --  3.9  < > = values in this interval not displayed. GFR: Estimated Creatinine Clearance: 35.9 mL/min (A) (by C-G formula based on SCr of 1.49 mg/dL (H)). Liver Function Tests:  Recent Labs Lab 11/09/16 0231 11/15/16 0606  AST 27  --   ALT 44  --   ALKPHOS 77  --   BILITOT 0.9  --   PROT 6.6  --   ALBUMIN 3.1* 2.9*   No results for input(s): LIPASE, AMYLASE in the last 168 hours. No results for input(s): AMMONIA in the last 168  hours. Coagulation Profile:  Recent Labs Lab 11/11/16 0418 11/12/16 0515 11/13/16 0839 11/14/16 0440 11/15/16 0606  INR 1.30 1.33 1.55 1.97 1.79   Cardiac Enzymes: No results for input(s): CKTOTAL, CKMB, CKMBINDEX, TROPONINI in the last 168 hours. BNP (last 3 results) No results for input(s): PROBNP in the last 8760 hours. HbA1C: No results for input(s): HGBA1C in the last 72 hours. CBG:  Recent Labs Lab 11/14/16 0735 11/14/16 1109 11/14/16 1659 11/14/16 2100 11/15/16 0801  GLUCAP 87 114* 239* 164* 84   Lipid Profile: No results for input(s): CHOL, HDL,  LDLCALC, TRIG, CHOLHDL, LDLDIRECT in the last 72 hours. Thyroid Function Tests: No results for input(s): TSH, T4TOTAL, FREET4, T3FREE, THYROIDAB in the last 72 hours. Anemia Panel: No results for input(s): VITAMINB12, FOLATE, FERRITIN, TIBC, IRON, RETICCTPCT in the last 72 hours. Sepsis Labs: No results for input(s): PROCALCITON, LATICACIDVEN in the last 168 hours.  No results found for this or any previous visit (from the past 240 hour(s)).       Radiology Studies: Dg Chest 2 View  Result Date: 11/13/2016 CLINICAL DATA:  Hypoxia EXAM: CHEST  2 VIEW COMPARISON:  11/05/2016 FINDINGS: Cardiac silhouette is mildly enlarged. There is vascular congestion and bilateral interstitial thickening similar to the prior exam. Additional linear lung base opacities noted consistent with atelectasis, also similar to the prior study. No evidence of pneumonia. There are stable calcified granuloma in the right mid lung associated with right hilar calcified nodes. No pleural effusion.  No pneumothorax. Skeletal structures are demineralized but grossly intact. IMPRESSION: 1. Mild congestive heart failure. Electronically Signed   By: Amie Portland M.D.   On: 11/13/2016 17:20        Scheduled Meds: . benzonatate  100 mg Oral BID  . clotrimazole   Topical BID  . dextromethorphan  15 mg Oral BID  . enoxaparin (LOVENOX) injection  120  mg Subcutaneous Q24H  . furosemide  120 mg Oral BID  . guaiFENesin  1,200 mg Oral BID  . hydrALAZINE  25 mg Oral TID  . insulin aspart  0-5 Units Subcutaneous QHS  . insulin aspart  0-9 Units Subcutaneous TID WC  . insulin glargine  12 Units Subcutaneous QHS  . isosorbide mononitrate  30 mg Oral Daily  . levothyroxine  25 mcg Oral QAC breakfast  . predniSONE  20 mg Oral Q breakfast  . sodium chloride flush  3 mL Intravenous Q12H  . warfarin  10 mg Oral ONCE-1800  . Warfarin - Pharmacist Dosing Inpatient   Does not apply q1800   Continuous Infusions: . sodium chloride       LOS: 10 days      Haydee Salter, MD [facility]   If 7PM-7AM, please contact night-coverage [email or web address] Password [facility] 11/15/2016, 10:55 AM

## 2016-11-15 NOTE — Progress Notes (Signed)
Progress Note  Patient Name: Sue Roach Date of Encounter: 11/15/2016  Primary Cardiologist: Dr. Herbie Baltimore   Subjective   Feels worse today than yesterday, mainly due to return of cough.   Pt refused to take Imdur yesterday. She hates being on medicines and noted that if we added a new BP med, another had to be taken away.   Inpatient Medications    Scheduled Meds: . benzonatate  100 mg Oral BID  . clotrimazole   Topical BID  . dextromethorphan  15 mg Oral BID  . enoxaparin (LOVENOX) injection  120 mg Subcutaneous Q24H  . furosemide  120 mg Oral BID  . guaiFENesin  1,200 mg Oral BID  . hydrALAZINE  25 mg Oral TID  . insulin aspart  0-5 Units Subcutaneous QHS  . insulin aspart  0-9 Units Subcutaneous TID WC  . insulin glargine  12 Units Subcutaneous QHS  . isosorbide mononitrate  15 mg Oral Daily  . levothyroxine  25 mcg Oral QAC breakfast  . predniSONE  20 mg Oral Q breakfast  . sodium chloride flush  3 mL Intravenous Q12H  . warfarin  10 mg Oral ONCE-1800  . Warfarin - Pharmacist Dosing Inpatient   Does not apply q1800   Continuous Infusions: . sodium chloride     PRN Meds: sodium chloride, acetaminophen, camphor-menthol, HYDROcodone-homatropine, ipratropium, levalbuterol, ondansetron (ZOFRAN) IV, sodium chloride flush   Vital Signs    Vitals:   11/14/16 1134 11/14/16 1615 11/14/16 2020 11/15/16 0506  BP: (!) 146/59 (!) 135/56 (!) 141/73 137/66  Pulse: (!) 51  64   Resp: Temp: 97.7 F (36.5 C)  98.7 F (37.1 C) 97.6 F (36.4 C)  TempSrc: Oral  Oral Oral  SpO2: 98%  98% 94%  Weight:    245 lb 11.2 oz (111.4 kg)  Height:        Intake/Output Summary (Last 24 hours) at 11/15/16 0939 Last data filed at 11/15/16 2119  Gross per 24 hour  Intake              480 ml  Output             1800 ml  Net            -1320 ml   Filed Weights   11/13/16 0451 11/14/16 0503 11/15/16 0506  Weight: 244 lb 14.4 oz (111.1 kg) 244 lb 9.6 oz (110.9 kg)  245 lb 11.2 oz (111.4 kg)    Telemetry    Atrial flutter with a CVR - Personally Reviewed  ECG    Atrial flutter with a CVR- Personally Reviewed  Physical Exam   GEN: No acute distress.  Obese  Neck: No JVD Cardiac: irregular rhythm, regular rate, no murmurs, rubs, or gallops.  Respiratory: bilateral rhonchi and wheezing. GI: Soft, nontender, non-distended  MS: trace bilateral LE edema; No deformity. Neuro:  Nonfocal  Psych: Normal affect   Labs    Chemistry Recent Labs Lab 11/09/16 0231  11/13/16 0839 11/14/16 0440 11/15/16 0606  NA 136  < > 141 142 139  K 5.1  < > 4.2 4.2 4.4  CL 102  < > 95* 98* 93*  CO2 28  < > 36* 36* 36*  GLUCOSE 136*  < > 97 105* 93  BUN 100*  < > 94* 92* 80*  CREATININE 1.93*  < > 1.72* 1.71* 1.49*  CALCIUM 8.5*  < > 8.6* 8.1* 8.2*  PROT 6.6  --   --   --   --  ALBUMIN 3.1*  --   --   --  2.9*  AST 27  --   --   --   --   ALT 44  --   --   --   --   ALKPHOS 77  --   --   --   --   BILITOT 0.9  --   --   --   --   GFRNONAA 24*  < > 27* 28* 33*  GFRAA 28*  < > 32* 32* 38*  ANIONGAP 6  < > < > = values in this interval not displayed.   Hematology Recent Labs Lab 11/13/16 0839 11/14/16 0440 11/15/16 0606  WBC 10.1 7.0 8.3  RBC 4.65 4.25 4.31  HGB 12.1 11.0* 11.1*  HCT 40.7 37.0 37.6  MCV 87.5 87.1 87.2  MCH 26.0 25.9* 25.8*  MCHC 29.7* 29.7* 29.5*  RDW 18.9* 18.9* 18.8*  PLT 310 281 283    Cardiac EnzymesNo results for input(s): TROPONINI in the last 168 hours. No results for input(s): TROPIPOC in the last 168 hours.   BNPNo results for input(s): BNP, PROBNP in the last 168 hours.   DDimer No results for input(s): DDIMER in the last 168 hours.   Radiology    Dg Chest 2 View  Result Date: 11/13/2016 CLINICAL DATA:  Hypoxia EXAM: CHEST  2 VIEW COMPARISON:  11/05/2016 FINDINGS: Cardiac silhouette is mildly enlarged. There is vascular congestion and bilateral interstitial thickening similar to the prior exam.  Additional linear lung base opacities noted consistent with atelectasis, also similar to the prior study. No evidence of pneumonia. There are stable calcified granuloma in the right mid lung associated with right hilar calcified nodes. No pleural effusion.  No pneumothorax. Skeletal structures are demineralized but grossly intact. IMPRESSION: 1. Mild congestive heart failure. Electronically Signed   By: Amie Portland M.D.   On: 11/13/2016 17:20    Cardiac Studies   2D echo 11/07/16 Study Conclusions  - Left ventricle: The cavity size was mildly dilated. Wall thickness was normal. Systolic function was normal. The estimated ejection fraction was in the range of 55% to 60%. Wall motion was normal; there were no regional wall motion abnormalities. - Ventricular septum: The contour showed diastolic flattening and systolic flattening. These changes are consistent with RV volume and pressure overload. - Mitral valve: There was mild regurgitation. Valve area by continuity equation (using LVOT flow): 2.43 cm^2. - Left atrium: The atrium was severely dilated. - Right ventricle: The cavity size was moderately dilated. Systolic function was moderately reduced. - Right atrium: The atrium was severely dilated. - Tricuspid valve: There was malcoaptation of the valve leaflets. There was moderate-severe regurgitation directed centrally. Diastolic regurgitation was present. - Pulmonary arteries: Systolic pressure was moderately increased. PA peak pressure: 58 mm Hg (S).  Patient Profile     76 y.o.femalewith a hx of chronic diastolic HF, COPD, Pulmonary HTN, DM, essential HTN and CKD admitted for acute on chronic HF and new onset atrial flutter. 2D Echo with normal LVEF however right sided HF w/ severely dilated RA, moderately dilated RV and moderately reduced systolic function. PA pressure 58 mm Hg.   Assessment & Plan    1. Acute on Chronic Right Sided HF: LVEF preserved. RV  dysfunction as noted above. Still with mild LEE on exam. Continue lasix. Transitioned to PO lasix yesterday. Nephrology has recommneded, 120 mg BID. Pt to f/u with Dr. Hyman Hopes. Low sodium diet advised. Fluid restriction  per nephrology.   2. New Onset Atrial Flutter:  In the setting of pulmonary disease  (COPD and pulmonary HTN). She was on a BB but was discontinued due to bradycardia. Rate is currently controlled in the upper 50s-low 60s w/o any agents currently. On Coumadin for anticoagulation. INR subtherapeutic at 149. Pharmacy dosing. She would prefer to have her INR followed at PCP.   2. COPD: on supplemental O2. With worsening cough today. Management per IM.   3. CKD: management per nephrology. Appreciate their assistance with managing diuretics.   4. HTN: Imdur was added by MD yesterday, however RN notified me that patient refused to take yesterday. Pt does not want to add any new medications w/o taking an old one away. Pt informed that HTN sometimes requires more than 1 agent to control. She is agreeing to take hydralazine and Lasix. We may consider increasing dose of hydrazine, instead of adding Imdur. Most recent BP this morning was 137/66. We will continue to monitor.   For questions or updates, please contact [facility] Please consult [email or web address] for contact info under Cardiology/STEMI.   Signed, Robbie Lis, PA-C  11/15/2016, 9:39 AM    The patient was seen, examined and discussed with Brittainy M. Sharol Harness, PA-C and I agree with the above.   The patient now appears euvolemic, still on O2 via , she walked with PT and felt SOB, she states that she doesn't feel any better today. Nephrology recommended lasix 120 mg PO BID and increase to 160 mg po BID if her weight is up, crea has improved 1.71-->1.49.  BP is still elevation, I would increase Imdur to 30 mg po daily. The mainstay of therapy and the biggest challenge would be compliance. We would also recommend a sleep  apnea evaluation and home O2 evaluation.  We will sign off, call us with any questions.   Tobias Alexander, MD 11/15/2016

## 2016-11-16 ENCOUNTER — Encounter (HOSPITAL_COMMUNITY): Payer: Self-pay | Admitting: Family Medicine

## 2016-11-16 LAB — CBC
HCT: 37.1 % (ref 36.0–46.0)
HEMOGLOBIN: 11.3 g/dL — AB (ref 12.0–15.0)
MCH: 26.3 pg (ref 26.0–34.0)
MCHC: 30.5 g/dL (ref 30.0–36.0)
MCV: 86.5 fL (ref 78.0–100.0)
Platelets: 274 10*3/uL (ref 150–400)
RBC: 4.29 MIL/uL (ref 3.87–5.11)
RDW: 19.5 % — ABNORMAL HIGH (ref 11.5–15.5)
WBC: 8.4 10*3/uL (ref 4.0–10.5)

## 2016-11-16 LAB — RENAL FUNCTION PANEL
ALBUMIN: 2.9 g/dL — AB (ref 3.5–5.0)
ANION GAP: 8 (ref 5–15)
BUN: 81 mg/dL — ABNORMAL HIGH (ref 6–20)
CALCIUM: 8.3 mg/dL — AB (ref 8.9–10.3)
CO2: 35 mmol/L — ABNORMAL HIGH (ref 22–32)
Chloride: 94 mmol/L — ABNORMAL LOW (ref 101–111)
Creatinine, Ser: 1.56 mg/dL — ABNORMAL HIGH (ref 0.44–1.00)
GFR calc non Af Amer: 31 mL/min — ABNORMAL LOW (ref >60)
GFR, EST AFRICAN AMERICAN: 36 mL/min — AB (ref >60)
GLUCOSE: 232 mg/dL — AB (ref 65–99)
PHOSPHORUS: 3.6 mg/dL (ref 2.5–4.6)
POTASSIUM: 5.3 mmol/L — AB (ref 3.5–5.1)
SODIUM: 137 mmol/L (ref 135–145)

## 2016-11-16 LAB — PROTIME-INR
INR: 2.01
PROTHROMBIN TIME: 22.6 s — AB (ref 11.4–15.2)

## 2016-11-16 LAB — GLUCOSE, CAPILLARY
GLUCOSE-CAPILLARY: 219 mg/dL — AB (ref 65–99)
Glucose-Capillary: 189 mg/dL — ABNORMAL HIGH (ref 65–99)

## 2016-11-16 MED ORDER — IPRATROPIUM-ALBUTEROL 0.5-2.5 (3) MG/3ML IN SOLN: 3.0000 mL | RESPIRATORY_TRACT | Status: DC | PRN

## 2016-11-16 MED ORDER — BENZONATATE 100 MG PO CAPS: 100.0000 mg | ORAL_CAPSULE | Freq: Three times a day (TID) | ORAL | 0 refills | Status: DC | PRN

## 2016-11-16 MED ORDER — ISOSORBIDE MONONITRATE ER 30 MG PO TB24: 30.0000 mg | ORAL_TABLET | Freq: Every day | ORAL | 0 refills | Status: AC

## 2016-11-16 MED ORDER — WARFARIN SODIUM 10 MG PO TABS
10.0000 mg | ORAL_TABLET | Freq: Once | ORAL | Status: AC
  Administered 2016-11-16: 10 mg via ORAL
  Filled 2016-11-16: qty 1

## 2016-11-16 MED ORDER — FUROSEMIDE 40 MG PO TABS: 120.0000 mg | ORAL_TABLET | Freq: Two times a day (BID) | ORAL | 0 refills | Status: AC

## 2016-11-16 NOTE — Progress Notes (Addendum)
Home oxygen ordered through [facility]; Attending MD please sign off 02 order so that it can be delivered to the patient's room ; PCP: Kaleen Mask, MD- her does not want any HHC ordered on his patients at discharge and request that they are seen in the office so the he can decide if the patient need [facility] services; Alexis Goodell [phone]

## 2016-11-16 NOTE — Progress Notes (Signed)
MD paged regarding oxygen order.

## 2016-11-16 NOTE — Progress Notes (Signed)
Tech offered assistance with bath. Pt stated she has towels and wash cloths and will do bath later.

## 2016-11-16 NOTE — Progress Notes (Signed)
ANTICOAGULATION CONSULT NOTE - Follow-up Consult  Pharmacy Consult for warfarin Indication: atrial fibrillation  Allergies  Allergen Reactions  . Sulfa Antibiotics Other (See Comments)    unknown  . Coreg [Carvedilol] Rash    Hair loss  . Zinc Rash   Patient Measurements: Height: 5' (152.4 cm) Weight: 245 lb 14.4 oz (111.5 kg) (scale c) IBW/kg (Calculated) : 45.5  Vital Signs: Temp: 97.5 F (36.4 C) (09/26 0321) Temp Source: Oral (09/26 0321) BP: 153/65 (09/26 0321) Pulse Rate: 61 (09/26 0321)  Labs:  Recent Labs  11/14/16 0440 11/15/16 0606 11/16/16 0643  HGB 11.0* 11.1* 11.3*  HCT 37.0 37.6 37.1  PLT 281 283 274  LABPROT 22.3* 20.7* 22.6*  INR 1.97 1.79 2.01  CREATININE 1.71* 1.49* 1.56*   Estimated Creatinine Clearance: 34.3 mL/min (A) (by C-G formula based on SCr of 1.56 mg/dL (H)).  Medical History: Past Medical History:  Diagnosis Date  . CHF (congestive heart failure) (HCC)   . Diabetes mellitus without complication (HCC)   . Hypertension   . Kidney disease   . Renal disorder   . Shingles    Medications:  Prescriptions Prior to Admission  Medication Sig Dispense Refill Last Dose  . acetaminophen (TYLENOL) 325 MG tablet Take 2 tablets (650 mg total) by mouth every 4 (four) hours as needed for mild pain, moderate pain, fever or headache.   unknown at PRN  . aspirin EC 81 MG tablet Take 81 mg by mouth daily.   11/04/2016 at Unknown time  . carvedilol (COREG) 3.125 MG tablet Take 3.125 mg by mouth 2 (two) times daily.  0 11/04/2016 at 0600  . docusate sodium (COLACE) 100 MG capsule Take 100 mg by mouth 2 (two) times daily as needed for constipation.   UNKNOWN at prn  . furosemide (LASIX) 80 MG tablet Take 1.5 tablets (120 mg total) by mouth 2 (two) times daily. (Patient taking differently: Take 160 mg by mouth 2 (two) times daily. )   11/04/2016 at Unknown time  . glimepiride (AMARYL) 4 MG tablet Take 4 mg by mouth daily.  1 11/04/2016 at Unknown time  .  hydrALAZINE (APRESOLINE) 25 MG tablet Take 25 mg by mouth 3 (three) times daily.  0 11/04/2016 at Unknown time  . pioglitazone (ACTOS) 45 MG tablet Take 45 mg by mouth daily.  0 11/04/2016 at Unknown time  . VENTOLIN HFA 108 (90 Base) MCG/ACT inhaler Inhale 2 puffs into the lungs daily.  1 11/04/2016 at Unknown time    Assessment: Sue Roach a 76 y.o.female with CKD found to have atrial fibrillation. Initially started on Eliquis, but patient cannot afford long term and asked to transition to warfarin. Lovenox will be used until INR therapeutic. Last dose of Eliquis ~1000 on 9/19.  INR changed from 1.79 to 2.01 (first day within therapeutic range). CBC stable. No signs/symptoms of bleeding.   Goal of Therapy:  INR 2-3 Monitor platelets by anticoagulation protocol: Yes   Plan:  Warfarin 10 mg po x 1 Lovenox  once daily Will defer to cardiology to discontinue enoxaparin once INR remains therapeutic  Monitor renal function, INR, CBC and for s/sx of bleeding  Thank you, Girard Cooter, PharmD Clinical Pharmacist  Phone: [phone] 11/16/2016 9:53 AM

## 2016-11-16 NOTE — Progress Notes (Signed)
Pt refusing labs, states she is wants to be discharged by 1pm. Threating to leave AMA. MD made aware. Explained to pt there are no orders for discharge as of yet and it takes time for 02 set up. Stated she didn't care and wants to leave by 1pm.

## 2016-11-16 NOTE — Discharge Summary (Signed)
Physician Discharge Summary  Sue Roach WUJ:[id number] DOB: 11-06-1940 DOA: 11/04/2016  PCP: Kaleen Mask, MD  Admit date: 11/04/2016 Discharge date: 11/16/2016  Time spent: 35 minutes  Recommendations for Outpatient Follow-up:  1. F/u with cardio 2. F/u with PCP   Discharge Diagnoses:  Principal Problem:   Acute on chronic right-sided congestive heart failure (HCC) Active Problems:   Diabetes mellitus without complication (HCC)   Hypertension, uncontrolled   Hypothyroidism, adult   Acute respiratory failure with hypoxemia (HCC)   CKD (chronic kidney disease) stage 4, GFR 15-29 ml/min (HCC)   Chronic anemia   Atrial flutter (HCC)   Discharge Condition: stable  Diet recommendation: Renal/DM  Filed Weights   11/14/16 0503 11/15/16 0506 11/16/16 0321  Weight: 110.9 kg (244 lb 9.6 oz) 111.4 kg (245 lb 11.2 oz) 111.5 kg (245 lb 14.4 oz)    History of present illness:   Sue Roach is a 76 y.o. female with medical history significant for obesity, chronic right-sided heart failure on diuretics with mild pulmonary HTN, DM 2 on oral agents, chronic lower extremity stasis dermatitis, CKD 4, chronic anemia and hypertension. Patient reports that 3 days ago she developed a scratchy, sore throat with a headache and persistent coughing of clear mucoid sputum. No fevers no chills. No significant change in chronic lower extremity edema. She did use a prescribed cream on her left lower extremity yesterday but her skin began to burn and she removed the cream and noticed a residual redness/erythema after removal of the cream. She went to her PCP office today for she was found to be hypoxemic. She does not work chronic oxygen. Upon presentation to the ER on room air her saturations were 60%. Chest x-ray revealed early edema without effusion and cardiomegaly. She was also found to be in atrial flutter. EDP had planned on placing the patient on BiPAP but after administration  of Lasix her respiratory symptoms improved therefore initial order for stepdown unit was changed to telemetry.  Hospital Course:  Pt presented in CHF AE and CCM consulted due to severity along with cardio. Pt on biapap and given diuresis. Intermittently with ionotropes. Also treated as COP AE with steroids, inhalers and doxy. Weaned to RA. Neprho advising due to CKD. As patient began to improve and she started to refuse care. Refusing of all monitors and blood work. On multiple occasions she told different providers that she was unwilling to take new medications without getting rid of one. Pt req d/c and d/c home.  Procedures: ECHO Study Conclusions  - Left ventricle: The cavity size was mildly dilated. Wall   thickness was normal. Systolic function was normal. The estimated   ejection fraction was in the range of 55% to 60%. Wall motion was   normal; there were no regional wall motion abnormalities. - Ventricular septum: The contour showed diastolic flattening and   systolic flattening. These changes are consistent with RV volume   and pressure overload. - Mitral valve: There was mild regurgitation. Valve area by   continuity equation (using LVOT flow): 2.43 cm^2. - Left atrium: The atrium was severely dilated. - Right ventricle: The cavity size was moderately dilated. Systolic   function was moderately reduced. - Right atrium: The atrium was severely dilated. - Tricuspid valve: There was malcoaptation of the valve leaflets.   There was moderate-severe regurgitation directed centrally.   Diastolic regurgitation was present. - Pulmonary arteries: Systolic pressure was moderately increased.  PA peak pressure: 58 mm Hg (S).  Consultations:  Nephro, Cardio, CCM  Discharge Exam: Vitals:   11/16/16 0321 11/16/16 1034  BP: (!) 153/65   Pulse: 61   Resp: 20   Temp: (!) 97.5 F (36.4 C)   SpO2: 94% 93%    General: NAD, NCAT Cardiovascular: RRR, no MRG Respiratory: CTAB, nl  wob  Discharge Instructions   Discharge Instructions    Call MD for:  difficulty breathing, headache or visual disturbances    Complete by:  As directed    Call MD for:  persistant nausea and vomiting    Complete by:  As directed    Call MD for:  temperature >100.4    Complete by:  As directed    Diet - low sodium heart healthy    Complete by:  As directed    Discharge instructions    Complete by:  As directed    Follow-up with your physician for lab work Friday. Get a BMP.   Increase activity slowly    Complete by:  As directed      Discharge Medication List as of 11/16/2016  3:01 PM    START taking these medications   Details  benzonatate (TESSALON) 100 MG capsule Take 1 capsule (100 mg total) by mouth 3 (three) times daily as needed for cough., Starting Wed 11/16/2016, Normal    isosorbide mononitrate (IMDUR) 30 MG 24 hr tablet Take 1 tablet (30 mg total) by mouth daily., Starting Thu 11/17/2016, Until Wed 12/07/2016, Normal      CONTINUE these medications which have CHANGED   Details  furosemide (LASIX) 40 MG tablet Take 3 tablets (120 mg total) by mouth 2 (two) times daily., Starting Wed 11/16/2016, Until Tue 12/06/2016, Normal      CONTINUE these medications which have NOT CHANGED   Details  acetaminophen (TYLENOL) 325 MG tablet Take 2 tablets (650 mg total) by mouth every 4 (four) hours as needed for mild pain, moderate pain, fever or headache., Starting Fri 02/05/2016, No Print    aspirin EC 81 MG tablet Take 81 mg by mouth daily., Historical Med    carvedilol (COREG) 3.125 MG tablet Take 3.125 mg by mouth 2 (two) times daily., Starting Mon 09/19/2016, Historical Med    docusate sodium (COLACE) 100 MG capsule Take 100 mg by mouth 2 (two) times daily as needed for constipation., Historical Med    glimepiride (AMARYL) 4 MG tablet Take 4 mg by mouth daily., Starting Wed 11/02/2016, Historical Med    hydrALAZINE (APRESOLINE) 25 MG tablet Take 25 mg by mouth 3 (three)  times daily., Starting Wed 11/04/2015, Historical Med    pioglitazone (ACTOS) 45 MG tablet Take 45 mg by mouth daily., Starting Thu 09/08/2016, Historical Med    VENTOLIN HFA 108 (90 Base) MCG/ACT inhaler Inhale 2 puffs into the lungs daily., Starting Thu 11/03/2016, Historical Med      STOP taking these medications     levothyroxine (SYNTHROID, LEVOTHROID) 25 MCG tablet        Allergies  Allergen Reactions  . Sulfa Antibiotics Other (See Comments)    unknown  . Coreg [Carvedilol] Rash    Hair loss  . Zinc Rash   Follow-up Information    Elvis Coil, MD Follow up on 12/12/2016.   Specialty:  Nephrology Why:  appt has been made for pt on 10/22 - to be there at 10:15 AM Contact information: [address] [place] [address] [phone]        Kaleen Mask, MD Follow up in 2 day(s).  Specialty:  Family Medicine Why:  Labs: BMP, hospialization review, review current medications Contact information: [address] [place] [address] [phone]        Marykay Lex, MD Follow up in 3 week(s).   Specialty:  Cardiology Why:  Follow-up for heart failure, medication review Contact information: [address] [place] [address] [phone]            The results of significant diagnostics from this hospitalization (including imaging, microbiology, ancillary and laboratory) are listed below for reference.    Significant Diagnostic Studies: Dg Chest 2 View  Result Date: 11/15/2016 CLINICAL DATA:  Cough, shortness of breath, and chest congestion. History of CHF. EXAM: CHEST  2 VIEW COMPARISON:  PA and lateral chest x-ray of November 13, 2016 FINDINGS: The lungs are well-expanded. There is right middle lobe atelectasis which is more conspicuous today. The cardiac silhouette is enlarged. The pulmonary vascularity is mildly prominent centrally. The bony thorax exhibits no acute abnormality. IMPRESSION: Progressive right middle lobe  atelectasis. Follow-up radiographs following anticipated antibiotic therapy are recommended to assure clearing. The findings may be secondary to a central obstructing lesion and ultimately chest CT scanning may be needed. Mild CHF, stable. Thoracic aortic atherosclerosis. Electronically Signed   By: David  [place] M.D.   On: 11/15/2016 12:51   Dg Chest 2 View  Result Date: 11/13/2016 CLINICAL DATA:  Hypoxia EXAM: CHEST  2 VIEW COMPARISON:  11/05/2016 FINDINGS: Cardiac silhouette is mildly enlarged. There is vascular congestion and bilateral interstitial thickening similar to the prior exam. Additional linear lung base opacities noted consistent with atelectasis, also similar to the prior study. No evidence of pneumonia. There are stable calcified granuloma in the right mid lung associated with right hilar calcified nodes. No pleural effusion.  No pneumothorax. Skeletal structures are demineralized but grossly intact. IMPRESSION: 1. Mild congestive heart failure. Electronically Signed   By: Amie Portland M.D.   On: 11/13/2016 17:20   Dg Chest 2 View  Result Date: 11/05/2016 CLINICAL DATA:  CHF. EXAM: CHEST  2 VIEW COMPARISON:  Chest x-ray dated November 04, 2016. FINDINGS: Stable moderate cardiomegaly. Mild pulmonary vascular congestion. Mild basal predominant interstitial thickening, similar to prior study. Bibasilar atelectasis. No focal consolidation, pleural effusion, or pneumothorax. No acute osseous abnormality. IMPRESSION: Stable cardiomegaly with mild interstitial edema. Electronically Signed   By: Obie Dredge M.D.   On: 11/05/2016 08:42   Dg Chest 2 View  Result Date: 11/04/2016 CLINICAL DATA:  76 year old female with a history of decreased oxygenation EXAM: CHEST  2 VIEW COMPARISON:  02/04/2016, 01/28/2016 FINDINGS: Cardiomediastinal silhouette again enlarged with cardiomegaly. Calcifications of the aortic arch. No pneumothorax. Interlobular septal thickening with coarsened interstitial  markings. No large pleural effusion. No confluent airspace disease. Degenerative changes of the spine.  No displaced fracture IMPRESSION: Evidence of early edema with no large pleural effusion. Cardiomegaly. Electronically Signed   By: Gilmer Mor D.O.   On: 11/04/2016 13:50   Dg Abd 1 View  Result Date: 11/07/2016 CLINICAL DATA:  Abdominal distention. EXAM: ABDOMEN - 1 VIEW COMPARISON:  None. FINDINGS: The bowel gas pattern is normal. No radio-opaque calculi or other significant radiographic abnormality are seen. No visible free air or free fluid. No discrete bone abnormality. IMPRESSION: Benign-appearing abdomen. Electronically Signed   By: Francene Boyers M.D.   On: 11/07/2016 13:56    Microbiology: No results found for this or any previous visit (from the past 240 hour(s)).   Labs: Basic Metabolic Panel:  Recent Labs Lab 11/12/16 1601 11/13/16 0839 11/14/16 0440 11/15/16 0606 11/16/16 0643  NA 139 141 142 139 137  K 4.7 4.2 4.2 4.4 5.3*  CL 96* 95* 98* 93* 94*  CO2 33* 36* 36* 36* 35*  GLUCOSE 169* 97 105* 93 232*  BUN 96* 94* 92* 80* 81*  CREATININE 1.72* 1.72* 1.71* 1.49* 1.56*  CALCIUM 8.4* 8.6* 8.1* 8.2* 8.3*  PHOS  --   --   --  3.9 3.6   Liver Function Tests:  Recent Labs Lab 11/15/16 0606 11/16/16 0643  ALBUMIN 2.9* 2.9*   No results for input(s): LIPASE, AMYLASE in the last 168 hours. No results for input(s): AMMONIA in the last 168 hours. CBC:  Recent Labs Lab 11/12/16 0515 11/13/16 0839 11/14/16 0440 11/15/16 0606 11/16/16 0643  WBC 7.5 10.1 7.0 8.3 8.4  HGB 10.8* 12.1 11.0* 11.1* 11.3*  HCT 36.4 40.7 37.0 37.6 37.1  MCV 87.3 87.5 87.1 87.2 86.5  PLT 301 310 281 283 274   Cardiac Enzymes: No results for input(s): CKTOTAL, CKMB, CKMBINDEX, TROPONINI in the last 168 hours. BNP: BNP (last 3 results)  Recent Labs  01/27/16 0844 11/04/16 1404  BNP 512.7* 360.0*    ProBNP (last 3 results) No results for input(s): PROBNP in the last 8760  hours.  CBG:  Recent Labs Lab 11/15/16 1123 11/15/16 1647 11/15/16 2151 11/16/16 0739 11/16/16 1103  GLUCAP 180* 212* 130* 219* 189*       Signed:  Haydee Salter MD  FACP  [facility] 11/16/2016, 2:53 PM

## 2016-11-16 NOTE — Progress Notes (Signed)
Pt ambulated in room saturations 87 RA placed on O2 2L SPO2 92% while ambulating. MD made aware.

## 2016-11-16 NOTE — Progress Notes (Signed)
Paged MD regarding 02 Orders

## 2016-11-16 NOTE — Progress Notes (Signed)
Pt removed tele will not place back on.

## 2016-11-16 NOTE — Progress Notes (Signed)
SATURATION QUALIFICATIONS: (This note is used to comply with regulatory documentation for home oxygen)  Patient Saturations on Room Air at Rest = 90%  Patient Saturations on Room Air while Ambulating = 87%  Patient Saturations on 2 Liters of oxygen while Ambulating = 93%   

## 2016-11-16 NOTE — Progress Notes (Signed)
Patient came to nurses station and is demanding that staff "get somebody down here now or it's going to be hell." MD text paged regarding patient being anxious for discharge.

## 2016-11-24 ENCOUNTER — Emergency Department (HOSPITAL_COMMUNITY): Payer: Medicare Other | Admitting: Certified Registered"

## 2016-11-24 ENCOUNTER — Inpatient Hospital Stay (HOSPITAL_BASED_OUTPATIENT_CLINIC_OR_DEPARTMENT_OTHER)
Admission: EM | Admit: 2016-11-24 | Discharge: 2016-11-28 | DRG: 268 | Disposition: A | Discharge status: Home Health | Payer: Medicare Other | Attending: Vascular Surgery | Admitting: Vascular Surgery

## 2016-11-24 ENCOUNTER — Encounter (HOSPITAL_BASED_OUTPATIENT_CLINIC_OR_DEPARTMENT_OTHER): Payer: Self-pay | Admitting: Emergency Medicine

## 2016-11-24 ENCOUNTER — Emergency Department (HOSPITAL_BASED_OUTPATIENT_CLINIC_OR_DEPARTMENT_OTHER): Payer: Medicare Other

## 2016-11-24 ENCOUNTER — Encounter (HOSPITAL_COMMUNITY)
Admission: EM | Disposition: A | Discharge status: Home Health | Payer: Self-pay | Source: Home / Self Care | Attending: Vascular Surgery

## 2016-11-24 ENCOUNTER — Emergency Department (HOSPITAL_COMMUNITY): Payer: Medicare Other

## 2016-11-24 ENCOUNTER — Inpatient Hospital Stay (HOSPITAL_COMMUNITY): Payer: Medicare Other

## 2016-11-24 DIAGNOSIS — F17210 Nicotine dependence, cigarettes, uncomplicated: Secondary | ICD-10-CM | POA: Diagnosis present

## 2016-11-24 DIAGNOSIS — I714 Abdominal aortic aneurysm, without rupture, unspecified: Secondary | ICD-10-CM | POA: Diagnosis present

## 2016-11-24 DIAGNOSIS — J449 Chronic obstructive pulmonary disease, unspecified: Secondary | ICD-10-CM | POA: Diagnosis present

## 2016-11-24 DIAGNOSIS — N184 Chronic kidney disease, stage 4 (severe): Secondary | ICD-10-CM | POA: Diagnosis present

## 2016-11-24 DIAGNOSIS — Z9119 Patient's noncompliance with other medical treatment and regimen: Secondary | ICD-10-CM | POA: Diagnosis not present

## 2016-11-24 DIAGNOSIS — I483 Typical atrial flutter: Secondary | ICD-10-CM | POA: Diagnosis not present

## 2016-11-24 DIAGNOSIS — Z888 Allergy status to other drugs, medicaments and biological substances status: Secondary | ICD-10-CM | POA: Diagnosis not present

## 2016-11-24 DIAGNOSIS — E876 Hypokalemia: Secondary | ICD-10-CM | POA: Diagnosis present

## 2016-11-24 DIAGNOSIS — I132 Hypertensive heart and chronic kidney disease with heart failure and with stage 5 chronic kidney disease, or end stage renal disease: Secondary | ICD-10-CM | POA: Diagnosis present

## 2016-11-24 DIAGNOSIS — Z79899 Other long term (current) drug therapy: Secondary | ICD-10-CM

## 2016-11-24 DIAGNOSIS — M5136 Other intervertebral disc degeneration, lumbar region: Secondary | ICD-10-CM | POA: Diagnosis present

## 2016-11-24 DIAGNOSIS — Z8249 Family history of ischemic heart disease and other diseases of the circulatory system: Secondary | ICD-10-CM

## 2016-11-24 DIAGNOSIS — E1122 Type 2 diabetes mellitus with diabetic chronic kidney disease: Secondary | ICD-10-CM | POA: Diagnosis present

## 2016-11-24 DIAGNOSIS — Z7984 Long term (current) use of oral hypoglycemic drugs: Secondary | ICD-10-CM

## 2016-11-24 DIAGNOSIS — I509 Heart failure, unspecified: Secondary | ICD-10-CM

## 2016-11-24 DIAGNOSIS — N390 Urinary tract infection, site not specified: Secondary | ICD-10-CM | POA: Diagnosis present

## 2016-11-24 DIAGNOSIS — Z6841 Body Mass Index (BMI) 40.0 and over, adult: Secondary | ICD-10-CM | POA: Diagnosis not present

## 2016-11-24 DIAGNOSIS — I5033 Acute on chronic diastolic (congestive) heart failure: Secondary | ICD-10-CM | POA: Diagnosis present

## 2016-11-24 DIAGNOSIS — Z8679 Personal history of other diseases of the circulatory system: Secondary | ICD-10-CM

## 2016-11-24 DIAGNOSIS — E669 Obesity, unspecified: Secondary | ICD-10-CM | POA: Diagnosis present

## 2016-11-24 DIAGNOSIS — I4892 Unspecified atrial flutter: Secondary | ICD-10-CM | POA: Diagnosis present

## 2016-11-24 DIAGNOSIS — I5031 Acute diastolic (congestive) heart failure: Secondary | ICD-10-CM | POA: Diagnosis not present

## 2016-11-24 DIAGNOSIS — Z7982 Long term (current) use of aspirin: Secondary | ICD-10-CM | POA: Diagnosis not present

## 2016-11-24 DIAGNOSIS — G8929 Other chronic pain: Secondary | ICD-10-CM | POA: Diagnosis present

## 2016-11-24 DIAGNOSIS — I5032 Chronic diastolic (congestive) heart failure: Secondary | ICD-10-CM | POA: Diagnosis not present

## 2016-11-24 DIAGNOSIS — Z882 Allergy status to sulfonamides status: Secondary | ICD-10-CM | POA: Diagnosis not present

## 2016-11-24 DIAGNOSIS — I701 Atherosclerosis of renal artery: Secondary | ICD-10-CM | POA: Diagnosis present

## 2016-11-24 DIAGNOSIS — Z95828 Presence of other vascular implants and grafts: Secondary | ICD-10-CM

## 2016-11-24 DIAGNOSIS — R112 Nausea with vomiting, unspecified: Secondary | ICD-10-CM | POA: Diagnosis present

## 2016-11-24 HISTORY — PX: ABDOMINAL AORTIC ENDOVASCULAR STENT GRAFT: SHX5707

## 2016-11-24 LAB — BASIC METABOLIC PANEL
Anion gap: 11 (ref 5–15)
BUN: 43 mg/dL — AB (ref 6–20)
CALCIUM: 7.9 mg/dL — AB (ref 8.9–10.3)
CO2: 27 mmol/L (ref 22–32)
CREATININE: 1.95 mg/dL — AB (ref 0.44–1.00)
Chloride: 95 mmol/L — ABNORMAL LOW (ref 101–111)
GFR calc non Af Amer: 24 mL/min — ABNORMAL LOW (ref >60)
GFR, EST AFRICAN AMERICAN: 27 mL/min — AB (ref >60)
GLUCOSE: 117 mg/dL — AB (ref 65–99)
Potassium: 3.1 mmol/L — ABNORMAL LOW (ref 3.5–5.1)
Sodium: 133 mmol/L — ABNORMAL LOW (ref 135–145)

## 2016-11-24 LAB — CBC
HCT: 29.5 % — ABNORMAL LOW (ref 36.0–46.0)
HEMATOCRIT: 26 % — AB (ref 36.0–46.0)
Hemoglobin: 9.2 g/dL — ABNORMAL LOW (ref 12.0–15.0)
Hemoglobin: 8.2 g/dL — ABNORMAL LOW (ref 12.0–15.0)
MCH: 26.6 pg (ref 26.0–34.0)
MCH: 27 pg (ref 26.0–34.0)
MCHC: 31.2 g/dL (ref 30.0–36.0)
MCHC: 31.5 g/dL (ref 30.0–36.0)
MCV: 85.3 fL (ref 78.0–100.0)
MCV: 85.5 fL (ref 78.0–100.0)
PLATELETS: 245 10*3/uL (ref 150–400)
Platelets: 209 10*3/uL (ref 150–400)
RBC: 3.46 MIL/uL — AB (ref 3.87–5.11)
RBC: 3.04 MIL/uL — AB (ref 3.87–5.11)
RDW: 18.4 % — AB (ref 11.5–15.5)
RDW: 18.4 % — AB (ref 11.5–15.5)
WBC: 11.4 10*3/uL — AB (ref 4.0–10.5)
WBC: 12.5 10*3/uL — AB (ref 4.0–10.5)

## 2016-11-24 LAB — COMPREHENSIVE METABOLIC PANEL
ALK PHOS: 89 U/L (ref 38–126)
ALT: 16 U/L (ref 14–54)
AST: 15 U/L (ref 15–41)
Albumin: 2.7 g/dL — ABNORMAL LOW (ref 3.5–5.0)
Anion gap: 12 (ref 5–15)
BILIRUBIN TOTAL: 0.7 mg/dL (ref 0.3–1.2)
BUN: 55 mg/dL — AB (ref 6–20)
CALCIUM: 8.3 mg/dL — AB (ref 8.9–10.3)
CO2: 28 mmol/L (ref 22–32)
CREATININE: 2.07 mg/dL — AB (ref 0.44–1.00)
Chloride: 92 mmol/L — ABNORMAL LOW (ref 101–111)
GFR, EST AFRICAN AMERICAN: 25 mL/min — AB (ref >60)
GFR, EST NON AFRICAN AMERICAN: 22 mL/min — AB (ref >60)
Glucose, Bld: 196 mg/dL — ABNORMAL HIGH (ref 65–99)
Potassium: 3.2 mmol/L — ABNORMAL LOW (ref 3.5–5.1)
Sodium: 132 mmol/L — ABNORMAL LOW (ref 135–145)
Total Protein: 7.2 g/dL (ref 6.5–8.1)

## 2016-11-24 LAB — URINALYSIS, ROUTINE W REFLEX MICROSCOPIC
BILIRUBIN URINE: NEGATIVE
GLUCOSE, UA: NEGATIVE mg/dL
Hgb urine dipstick: NEGATIVE
KETONES UR: NEGATIVE mg/dL
Nitrite: NEGATIVE
PH: 5 (ref 5.0–8.0)
Protein, ur: 100 mg/dL — AB
Specific Gravity, Urine: 1.016 (ref 1.005–1.030)

## 2016-11-24 LAB — PROTIME-INR
INR: 1.22
INR: 1.45
PROTHROMBIN TIME: 15.3 s — AB (ref 11.4–15.2)
Prothrombin Time: 17.5 seconds — ABNORMAL HIGH (ref 11.4–15.2)

## 2016-11-24 LAB — MAGNESIUM: Magnesium: 2.1 mg/dL (ref 1.7–2.4)

## 2016-11-24 LAB — CBG MONITORING, ED: GLUCOSE-CAPILLARY: 182 mg/dL — AB (ref 65–99)

## 2016-11-24 LAB — MRSA PCR SCREENING: MRSA BY PCR: NEGATIVE

## 2016-11-24 LAB — PREPARE RBC (CROSSMATCH)

## 2016-11-24 LAB — APTT
APTT: 65 s — AB (ref 24–36)
aPTT: 46 seconds — ABNORMAL HIGH (ref 24–36)

## 2016-11-24 LAB — ABO/RH: ABO/RH(D): A POS

## 2016-11-24 LAB — LIPASE, BLOOD: Lipase: 39 U/L (ref 11–51)

## 2016-11-24 SURGERY — INSERTION, ENDOVASCULAR STENT GRAFT, AORTA, ABDOMINAL
Anesthesia: General | Site: Abdomen

## 2016-11-24 MED ORDER — LACTATED RINGERS IV SOLN
INTRAVENOUS | Status: DC | PRN
  Administered 2016-11-24: 16:00:00 via INTRAVENOUS

## 2016-11-24 MED ORDER — MORPHINE SULFATE (PF) 4 MG/ML IV SOLN
4.0000 mg | Freq: Once | INTRAVENOUS | Status: AC
  Administered 2016-11-24: 4 mg via INTRAVENOUS
  Filled 2016-11-24: qty 1

## 2016-11-24 MED ORDER — OXYCODONE HCL 5 MG PO TABS
ORAL_TABLET | ORAL | Status: AC
  Filled 2016-11-24: qty 1

## 2016-11-24 MED ORDER — POTASSIUM CHLORIDE CRYS ER 20 MEQ PO TBCR
20.0000 meq | EXTENDED_RELEASE_TABLET | Freq: Every day | ORAL | Status: AC | PRN
  Administered 2016-11-25: 40 meq via ORAL
  Filled 2016-11-24: qty 2

## 2016-11-24 MED ORDER — HEPARIN SODIUM (PORCINE) 5000 UNIT/ML IJ SOLN
5000.0000 [IU] | Freq: Three times a day (TID) | INTRAMUSCULAR | Status: DC
  Administered 2016-11-25 – 2016-11-28 (×10): 5000 [IU] via SUBCUTANEOUS
  Filled 2016-11-24 (×10): qty 1

## 2016-11-24 MED ORDER — FENTANYL CITRATE (PF) 100 MCG/2ML IJ SOLN
INTRAMUSCULAR | Status: AC
  Filled 2016-11-24: qty 2

## 2016-11-24 MED ORDER — HEPARIN SODIUM (PORCINE) 1000 UNIT/ML IJ SOLN
INTRAMUSCULAR | Status: AC
  Filled 2016-11-24: qty 2

## 2016-11-24 MED ORDER — INSULIN ASPART 100 UNIT/ML ~~LOC~~ SOLN
0.0000 [IU] | Freq: Three times a day (TID) | SUBCUTANEOUS | Status: DC
  Administered 2016-11-25: 3 [IU] via SUBCUTANEOUS
  Administered 2016-11-25: 8 [IU] via SUBCUTANEOUS
  Administered 2016-11-26: 2 [IU] via SUBCUTANEOUS
  Administered 2016-11-26: 3 [IU] via SUBCUTANEOUS
  Administered 2016-11-27: 2 [IU] via SUBCUTANEOUS
  Administered 2016-11-27: 3 [IU] via SUBCUTANEOUS
  Administered 2016-11-28: 2 [IU] via SUBCUTANEOUS

## 2016-11-24 MED ORDER — CEFAZOLIN SODIUM-DEXTROSE 2-3 GM-% IV SOLR
INTRAVENOUS | Status: DC | PRN
  Administered 2016-11-24: 2 g via INTRAVENOUS

## 2016-11-24 MED ORDER — ONDANSETRON HCL 4 MG/2ML IJ SOLN
4.0000 mg | Freq: Once | INTRAMUSCULAR | Status: AC
Start: 2016-11-24 — End: 2016-11-24
  Administered 2016-11-24: 4 mg via INTRAVENOUS
  Filled 2016-11-24: qty 2

## 2016-11-24 MED ORDER — MAGNESIUM SULFATE 2 GM/50ML IV SOLN
2.0000 g | Freq: Every day | INTRAVENOUS | Status: DC | PRN
  Filled 2016-11-24: qty 50

## 2016-11-24 MED ORDER — OXYCODONE HCL 5 MG/5ML PO SOLN: 5.0000 mg | Freq: Once | ORAL | Status: AC | PRN

## 2016-11-24 MED ORDER — HYDRALAZINE HCL 20 MG/ML IJ SOLN: 5.0000 mg | INTRAMUSCULAR | Status: DC | PRN

## 2016-11-24 MED ORDER — ALBUTEROL SULFATE (2.5 MG/3ML) 0.083% IN NEBU
3.0000 mL | INHALATION_SOLUTION | Freq: Every day | RESPIRATORY_TRACT | Status: DC
  Administered 2016-11-25: 3 mL via RESPIRATORY_TRACT
  Filled 2016-11-24: qty 3

## 2016-11-24 MED ORDER — IOPAMIDOL (ISOVUE-370) INJECTION 76%
INTRAVENOUS | Status: AC
  Administered 2016-11-24: 100 mL
  Filled 2016-11-24: qty 100

## 2016-11-24 MED ORDER — PHENOL 1.4 % MT LIQD: 1.0000 | OROMUCOSAL | Status: DC | PRN

## 2016-11-24 MED ORDER — ONDANSETRON HCL 4 MG/2ML IJ SOLN
4.0000 mg | Freq: Once | INTRAMUSCULAR | Status: AC
  Administered 2016-11-24: 4 mg via INTRAVENOUS

## 2016-11-24 MED ORDER — BISACODYL 10 MG RE SUPP: 10.0000 mg | Freq: Every day | RECTAL | Status: DC | PRN

## 2016-11-24 MED ORDER — LORAZEPAM 2 MG/ML IJ SOLN
0.5000 mg | Freq: Once | INTRAMUSCULAR | Status: AC
  Administered 2016-11-24: 0.5 mg via INTRAVENOUS

## 2016-11-24 MED ORDER — PANTOPRAZOLE SODIUM 40 MG PO TBEC
40.0000 mg | DELAYED_RELEASE_TABLET | Freq: Every day | ORAL | Status: DC
  Administered 2016-11-24 – 2016-11-27 (×4): 40 mg via ORAL
  Filled 2016-11-24 (×5): qty 1

## 2016-11-24 MED ORDER — LORAZEPAM 2 MG/ML IJ SOLN
INTRAMUSCULAR | Status: AC
  Filled 2016-11-24: qty 1

## 2016-11-24 MED ORDER — SUCCINYLCHOLINE CHLORIDE 200 MG/10ML IV SOSY
PREFILLED_SYRINGE | INTRAVENOUS | Status: AC
  Filled 2016-11-24: qty 10

## 2016-11-24 MED ORDER — 0.9 % SODIUM CHLORIDE (POUR BTL) OPTIME
TOPICAL | Status: DC | PRN
  Administered 2016-11-24: 1000 mL

## 2016-11-24 MED ORDER — ACETAMINOPHEN 325 MG PO TABS
325.0000 mg | ORAL_TABLET | ORAL | Status: DC | PRN
  Administered 2016-11-25: 650 mg via ORAL
  Filled 2016-11-24 (×2): qty 2

## 2016-11-24 MED ORDER — HYDRALAZINE HCL 25 MG PO TABS
25.0000 mg | ORAL_TABLET | Freq: Three times a day (TID) | ORAL | Status: DC
  Administered 2016-11-25 – 2016-11-28 (×12): 25 mg via ORAL
  Filled 2016-11-24 (×12): qty 1

## 2016-11-24 MED ORDER — IODIXANOL 320 MG/ML IV SOLN
INTRAVENOUS | Status: DC | PRN
  Administered 2016-11-24: 85.3 mL via INTRAVENOUS

## 2016-11-24 MED ORDER — PROTAMINE SULFATE 10 MG/ML IV SOLN
INTRAVENOUS | Status: AC
  Filled 2016-11-24: qty 5

## 2016-11-24 MED ORDER — LABETALOL HCL 5 MG/ML IV SOLN
INTRAVENOUS | Status: DC
Start: 2016-11-24 — End: 2016-11-24
  Filled 2016-11-24: qty 4

## 2016-11-24 MED ORDER — BENZONATATE 100 MG PO CAPS: 100.0000 mg | ORAL_CAPSULE | Freq: Three times a day (TID) | ORAL | Status: DC | PRN

## 2016-11-24 MED ORDER — FENTANYL CITRATE (PF) 100 MCG/2ML IJ SOLN
50.0000 ug | Freq: Once | INTRAMUSCULAR | Status: AC
  Administered 2016-11-24: 50 ug via INTRAVENOUS

## 2016-11-24 MED ORDER — ONDANSETRON HCL 4 MG/2ML IJ SOLN
INTRAMUSCULAR | Status: AC
  Filled 2016-11-24: qty 2

## 2016-11-24 MED ORDER — MORPHINE SULFATE (PF) 4 MG/ML IV SOLN: 2.0000 mg | INTRAVENOUS | Status: DC | PRN

## 2016-11-24 MED ORDER — PROPOFOL 10 MG/ML IV BOLUS
INTRAVENOUS | Status: DC | PRN
  Administered 2016-11-24: 120 mg via INTRAVENOUS

## 2016-11-24 MED ORDER — DOCUSATE SODIUM 100 MG PO CAPS: 100.0000 mg | ORAL_CAPSULE | Freq: Two times a day (BID) | ORAL | Status: DC | PRN

## 2016-11-24 MED ORDER — SUGAMMADEX SODIUM 200 MG/2ML IV SOLN
INTRAVENOUS | Status: DC | PRN
  Administered 2016-11-24: 200 mg via INTRAVENOUS

## 2016-11-24 MED ORDER — ONDANSETRON HCL 4 MG/2ML IJ SOLN
4.0000 mg | Freq: Once | INTRAMUSCULAR | Status: AC | PRN
  Administered 2016-11-24: 4 mg via INTRAVENOUS
  Filled 2016-11-24: qty 2

## 2016-11-24 MED ORDER — ASPIRIN EC 81 MG PO TBEC
81.0000 mg | DELAYED_RELEASE_TABLET | Freq: Every day | ORAL | Status: DC
  Administered 2016-11-25 – 2016-11-28 (×4): 81 mg via ORAL
  Filled 2016-11-24 (×4): qty 1

## 2016-11-24 MED ORDER — ISOSORBIDE MONONITRATE ER 30 MG PO TB24
30.0000 mg | ORAL_TABLET | Freq: Every day | ORAL | Status: DC
  Administered 2016-11-25 – 2016-11-28 (×4): 30 mg via ORAL
  Filled 2016-11-24 (×4): qty 1

## 2016-11-24 MED ORDER — SODIUM CHLORIDE 0.9 % IV SOLN: 500.0000 mL | Freq: Once | INTRAVENOUS | Status: DC | PRN

## 2016-11-24 MED ORDER — HYDRALAZINE HCL 20 MG/ML IJ SOLN
INTRAMUSCULAR | Status: AC
  Filled 2016-11-24: qty 1

## 2016-11-24 MED ORDER — PIOGLITAZONE HCL 45 MG PO TABS
45.0000 mg | ORAL_TABLET | Freq: Every day | ORAL | Status: DC
  Administered 2016-11-25: 45 mg via ORAL
  Filled 2016-11-24: qty 1

## 2016-11-24 MED ORDER — LACTATED RINGERS IV SOLN
INTRAVENOUS | Status: DC | PRN
  Administered 2016-11-24: 15:00:00 via INTRAVENOUS

## 2016-11-24 MED ORDER — LIDOCAINE 2% (20 MG/ML) 5 ML SYRINGE
INTRAMUSCULAR | Status: DC | PRN
  Administered 2016-11-24: 60 mg via INTRAVENOUS

## 2016-11-24 MED ORDER — DEXTROSE 5 % IV SOLN
1.5000 g | Freq: Two times a day (BID) | INTRAVENOUS | Status: AC
  Administered 2016-11-24 – 2016-11-25 (×2): 1.5 g via INTRAVENOUS
  Filled 2016-11-24 (×2): qty 1.5

## 2016-11-24 MED ORDER — GUAIFENESIN-DM 100-10 MG/5ML PO SYRP: 15.0000 mL | ORAL_SOLUTION | ORAL | Status: DC | PRN

## 2016-11-24 MED ORDER — CEFAZOLIN SODIUM 1 G IJ SOLR
INTRAMUSCULAR | Status: AC
  Filled 2016-11-24: qty 20

## 2016-11-24 MED ORDER — SUCCINYLCHOLINE CHLORIDE 200 MG/10ML IV SOSY
PREFILLED_SYRINGE | INTRAVENOUS | Status: DC | PRN
  Administered 2016-11-24: 200 mg via INTRAVENOUS

## 2016-11-24 MED ORDER — HEPARIN SODIUM (PORCINE) 1000 UNIT/ML IJ SOLN
INTRAMUSCULAR | Status: DC | PRN
  Administered 2016-11-24: 11000 [IU] via INTRAVENOUS

## 2016-11-24 MED ORDER — ONDANSETRON HCL 4 MG/2ML IJ SOLN
4.0000 mg | Freq: Once | INTRAMUSCULAR | Status: AC | PRN
  Administered 2016-11-24: 4 mg via INTRAVENOUS

## 2016-11-24 MED ORDER — ONDANSETRON HCL 4 MG/2ML IJ SOLN
INTRAMUSCULAR | Status: DC | PRN
  Administered 2016-11-24: 4 mg via INTRAVENOUS

## 2016-11-24 MED ORDER — FENTANYL CITRATE (PF) 250 MCG/5ML IJ SOLN
INTRAMUSCULAR | Status: AC
  Filled 2016-11-24: qty 5

## 2016-11-24 MED ORDER — SODIUM CHLORIDE 0.9 % IV SOLN
INTRAVENOUS | Status: DC | PRN
  Administered 2016-11-24: 500 mL

## 2016-11-24 MED ORDER — ONDANSETRON HCL 4 MG/2ML IJ SOLN
4.0000 mg | Freq: Four times a day (QID) | INTRAMUSCULAR | Status: DC | PRN
  Administered 2016-11-25 (×2): 4 mg via INTRAVENOUS
  Filled 2016-11-24 (×2): qty 2

## 2016-11-24 MED ORDER — FENTANYL CITRATE (PF) 100 MCG/2ML IJ SOLN
25.0000 ug | INTRAMUSCULAR | Status: DC | PRN
  Administered 2016-11-24 (×3): 50 ug via INTRAVENOUS

## 2016-11-24 MED ORDER — CIPROFLOXACIN IN D5W 400 MG/200ML IV SOLN
400.0000 mg | INTRAVENOUS | Status: AC
  Administered 2016-11-24: 400 mg via INTRAVENOUS
  Filled 2016-11-24: qty 200

## 2016-11-24 MED ORDER — POLYETHYLENE GLYCOL 3350 17 G PO PACK
17.0000 g | PACK | Freq: Every day | ORAL | Status: DC | PRN
  Administered 2016-11-27: 17 g via ORAL
  Filled 2016-11-24: qty 1

## 2016-11-24 MED ORDER — CIPROFLOXACIN IN D5W 400 MG/200ML IV SOLN
400.0000 mg | Freq: Two times a day (BID) | INTRAVENOUS | Status: DC
Start: 2016-11-25 — End: 2016-11-25
  Administered 2016-11-25: 400 mg via INTRAVENOUS
  Filled 2016-11-24 (×2): qty 200

## 2016-11-24 MED ORDER — OXYCODONE HCL 5 MG PO TABS
5.0000 mg | ORAL_TABLET | Freq: Once | ORAL | Status: AC | PRN
  Administered 2016-11-24: 5 mg via ORAL

## 2016-11-24 MED ORDER — OXYCODONE-ACETAMINOPHEN 5-325 MG PO TABS
1.0000 | ORAL_TABLET | ORAL | Status: DC | PRN
  Administered 2016-11-25 – 2016-11-27 (×2): 2 via ORAL
  Administered 2016-11-27: 1 via ORAL
  Filled 2016-11-24 (×4): qty 2

## 2016-11-24 MED ORDER — HYDRALAZINE HCL 20 MG/ML IJ SOLN
INTRAMUSCULAR | Status: DC | PRN
  Administered 2016-11-24: 10 mg via INTRAVENOUS

## 2016-11-24 MED ORDER — CARVEDILOL 3.125 MG PO TABS
3.1250 mg | ORAL_TABLET | Freq: Two times a day (BID) | ORAL | Status: DC
  Administered 2016-11-25 – 2016-11-28 (×8): 3.125 mg via ORAL
  Filled 2016-11-24 (×8): qty 1

## 2016-11-24 MED ORDER — ACETAMINOPHEN 325 MG RE SUPP
325.0000 mg | RECTAL | Status: DC | PRN
  Filled 2016-11-24: qty 2

## 2016-11-24 MED ORDER — PROTAMINE SULFATE 10 MG/ML IV SOLN
INTRAVENOUS | Status: DC | PRN
  Administered 2016-11-24: 50 mg via INTRAVENOUS

## 2016-11-24 MED ORDER — ROCURONIUM BROMIDE 10 MG/ML (PF) SYRINGE
PREFILLED_SYRINGE | INTRAVENOUS | Status: DC | PRN
  Administered 2016-11-24: 50 mg via INTRAVENOUS

## 2016-11-24 MED ORDER — SUGAMMADEX SODIUM 200 MG/2ML IV SOLN
INTRAVENOUS | Status: AC
  Filled 2016-11-24: qty 2

## 2016-11-24 MED ORDER — SODIUM CHLORIDE 0.9 % IV SOLN
INTRAVENOUS | Status: DC
  Administered 2016-11-24 – 2016-11-26 (×2): via INTRAVENOUS

## 2016-11-24 MED ORDER — SODIUM CHLORIDE 0.9 % IV SOLN: Freq: Once | INTRAVENOUS | Status: DC

## 2016-11-24 MED ORDER — ROCURONIUM BROMIDE 10 MG/ML (PF) SYRINGE
PREFILLED_SYRINGE | INTRAVENOUS | Status: AC
  Filled 2016-11-24: qty 5

## 2016-11-24 MED ORDER — FENTANYL CITRATE (PF) 100 MCG/2ML IJ SOLN
INTRAMUSCULAR | Status: DC | PRN
  Administered 2016-11-24: 100 ug via INTRAVENOUS
  Administered 2016-11-24: 50 ug via INTRAVENOUS

## 2016-11-24 SURGICAL SUPPLY — 59 items
APPLICATOR COTTON TIP 6IN STRL (MISCELLANEOUS) ×3 IMPLANT
CANISTER SUCT 3000ML PPV (MISCELLANEOUS) ×3 IMPLANT
CATH ANGIO 5F BER2 65CM (CATHETERS) ×6 IMPLANT
CATH BEACON 5.038 65CM KMP-01 (CATHETERS) IMPLANT
CATH OMNI FLUSH .035X70CM (CATHETERS) ×3 IMPLANT
COVER PROBE W GEL 5X96 (DRAPES) ×3 IMPLANT
DERMABOND ADHESIVE PROPEN (GAUZE/BANDAGES/DRESSINGS) ×4
DERMABOND ADVANCED (GAUZE/BANDAGES/DRESSINGS) ×2
DERMABOND ADVANCED .7 DNX12 (GAUZE/BANDAGES/DRESSINGS) ×1 IMPLANT
DERMABOND ADVANCED .7 DNX6 (GAUZE/BANDAGES/DRESSINGS) ×2 IMPLANT
DEVICE CLOSURE PERCLS PRGLD 6F (VASCULAR PRODUCTS) ×7 IMPLANT
DEVICE TORQUE KENDALL .025-038 (MISCELLANEOUS) ×3 IMPLANT
DRSG TEGADERM 2-3/8X2-3/4 SM (GAUZE/BANDAGES/DRESSINGS) ×9 IMPLANT
DRYSEAL FLEXSHEATH 12FR 33CM (SHEATH) ×2
DRYSEAL FLEXSHEATH 18FR 33CM (SHEATH) ×2
ELECT REM PT RETURN 9FT ADLT (ELECTROSURGICAL) ×6
ELECTRODE REM PT RTRN 9FT ADLT (ELECTROSURGICAL) ×2 IMPLANT
EXCLUDER TNK 28X14.5MMX12CM (Endovascular Graft) ×1 IMPLANT
EXCLUDER TRUNK 28X14.5MMX12CM (Endovascular Graft) ×3 IMPLANT
GAUZE SPONGE 2X2 8PLY STRL LF (GAUZE/BANDAGES/DRESSINGS) ×1 IMPLANT
GLOVE BIO SURGEON STRL SZ7 (GLOVE) ×3 IMPLANT
GLOVE BIOGEL PI IND STRL 7.5 (GLOVE) ×1 IMPLANT
GLOVE BIOGEL PI INDICATOR 7.5 (GLOVE) ×2
GOWN STRL REUS W/ TWL LRG LVL3 (GOWN DISPOSABLE) ×3 IMPLANT
GOWN STRL REUS W/TWL LRG LVL3 (GOWN DISPOSABLE) ×6
GRAFT BALLN CATH 65CM (STENTS) IMPLANT
GUIDEWIRE ANGLED .035X150CM (WIRE) ×3 IMPLANT
KIT BASIN OR (CUSTOM PROCEDURE TRAY) ×3 IMPLANT
KIT ROOM TURNOVER OR (KITS) ×3 IMPLANT
LEG CONTRALATERAL 16X12X10 (Vascular Products) ×2 IMPLANT
LEG CONTRALETERAL16X16X11.5 (Endovascular Graft) ×2 IMPLANT
NEEDLE PERC 18GX7CM (NEEDLE) ×3 IMPLANT
NS IRRIG 1000ML POUR BTL (IV SOLUTION) ×3 IMPLANT
PACK ENDOVASCULAR (PACKS) ×3 IMPLANT
PAD ARMBOARD 7.5X6 YLW CONV (MISCELLANEOUS) ×6 IMPLANT
PERCLOSE PROGLIDE 6F (VASCULAR PRODUCTS) ×21
SET MICROPUNCTURE 5F STIFF (MISCELLANEOUS) ×3 IMPLANT
SHEATH AVANTI 11CM 8FR (MISCELLANEOUS) ×3 IMPLANT
SHEATH BRITE TIP 8FR 23CM (MISCELLANEOUS) ×3 IMPLANT
SHEATH DRYSEAL FLEX 12FR 33CM (SHEATH) ×1 IMPLANT
SHEATH DRYSEAL FLEX 18FR 33CM (SHEATH) ×1 IMPLANT
SPONGE GAUZE 2X2 STER 10/PKG (GAUZE/BANDAGES/DRESSINGS) ×2
SPONGE LAP 18X18 X RAY DECT (DISPOSABLE) ×3 IMPLANT
STAPLER VISISTAT 35W (STAPLE) IMPLANT
STENT GRAFT BALLN CATH 65CM (STENTS)
STENT GRAFT CONTRALAT 16X11.5 (Endovascular Graft) ×1 IMPLANT
STENT GRAFT CONTRALAT 16X12X10 (Vascular Products) ×1 IMPLANT
STOPCOCK MORSE 400PSI 3WAY (MISCELLANEOUS) ×6 IMPLANT
SUT ETHILON 3 0 PS 1 (SUTURE) IMPLANT
SUT MNCRL AB 4-0 PS2 18 (SUTURE) ×9 IMPLANT
SUT PROLENE 5 0 C 1 24 (SUTURE) IMPLANT
SUT VIC AB 2-0 CTX 36 (SUTURE) IMPLANT
SUT VIC AB 3-0 SH 27 (SUTURE)
SUT VIC AB 3-0 SH 27X BRD (SUTURE) IMPLANT
SYR 30ML LL (SYRINGE) ×3 IMPLANT
TRAY FOLEY W/METER SILVER 16FR (SET/KITS/TRAYS/PACK) ×3 IMPLANT
TUBING HIGH PRESSURE 120CM (CONNECTOR) ×3 IMPLANT
WIRE AMPLATZ SS-J .035X180CM (WIRE) ×6 IMPLANT
WIRE BENTSON .035X145CM (WIRE) ×6 IMPLANT

## 2016-11-24 NOTE — ED Provider Notes (Signed)
[facility]   CSN: [id number] Arrival date & time: 11/24/16  0751     History   Chief Complaint Chief Complaint  Patient presents with  . Emesis    HPI Sue Roach is a 76 y.o. female.  HPI Patient presents with 2 days of vomiting. She is unable to quantify the amount of vomiting. No blood in vomit. No loose stools. Denies any abdominal pain. States she has chronic low back pain which Suddenly worsened over the last few days as well. Denies radiation of pain to the legs. No focal weakness or numbness. No known trauma. Patient wears 2 L of oxygen at home. No increased shortness of breath or cough. She's been taking Tylenol at home for the pain. Past Medical History:  Diagnosis Date  . CHF (congestive heart failure) (HCC)   . Diabetes mellitus without complication (HCC)   . Hypertension   . Kidney disease   . Renal disorder   . Shingles     Patient Active Problem List   Diagnosis Date Noted  . Acute on chronic right-sided congestive heart failure (HCC) 11/04/2016  . Diabetes mellitus without complication (HCC) 11/04/2016  . Hypertension, uncontrolled 11/04/2016  . Hypothyroidism, adult 11/04/2016  . Acute respiratory failure with hypoxemia (HCC) 11/04/2016  . CKD (chronic kidney disease) stage 4, GFR 15-29 ml/min (HCC) 11/04/2016  . Chronic anemia 11/04/2016  . Atrial flutter (HCC) 11/04/2016  . Acute on chronic respiratory failure with hypoxemia (HCC) 02/15/2016  . 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 Hypothyroidism 02/15/2016  . Atrial flutter, paroxysmal (HCC) 02/15/2016  . Shingles   . Kidney disease   . CHF exacerbation (HCC)   . Advance care planning   . Palliative care by specialist   . Goals of care, counseling/discussion   . COPD exacerbation (HCC)   . Obstructive sleep apnea   . Obesity hypoventilation syndrome (HCC)   . Chronic systolic CHF (congestive heart failure) (HCC)    . Dilated cardiomyopathy (HCC)   . Pulmonary hypertension (HCC)   . Controlled diabetes mellitus type 2 with complications (HCC)   . Acute renal failure with acute tubular necrosis superimposed on stage 2 chronic kidney disease (HCC)   . CHF (congestive heart failure) (HCC) 01/27/2016  . Acute respiratory failure with hypoxia (HCC) 01/27/2016  . Chronic kidney disease 01/27/2016  . Diabetes mellitus, type II (HCC) 01/27/2016  . Leg wound, left 01/27/2016  . Anemia of chronic disease 01/27/2016  . Tobacco abuse 01/27/2016  . Diabetes mellitus with complication (HCC)   . Impetigo 05/29/2015  . Morbid obesity due to excess calories (HCC) 05/29/2015  . Acute on chronic diastolic congestive heart failure (HCC) 05/29/2015    History reviewed. No pertinent surgical history.  OB History    No data available       Home Medications    Prior to Admission medications   Medication Sig Start Date End Date Taking? Authorizing Provider  acetaminophen (TYLENOL) 325 MG tablet Take 2 tablets (650 mg total) by mouth every 4 (four) hours as needed for mild pain, moderate pain, fever or headache. 02/05/16   Hongalgi, Maximino Greenland, MD  aspirin EC 81 MG tablet Take 81 mg by mouth daily.    [provider]  benzonatate (TESSALON) 100 MG capsule Take 1 capsule (100 mg total) by mouth 3 (three) times daily as needed for cough. 11/16/16   Haydee Salter, MD  carvedilol (COREG) 3.125 MG tablet Take 3.125  mg by mouth 2 (two) times daily. 09/19/16   [provider]  docusate sodium (COLACE) 100 MG capsule Take 100 mg by mouth 2 (two) times daily as needed for constipation.    [provider]  furosemide (LASIX) 40 MG tablet Take 3 tablets (120 mg total) by mouth 2 (two) times daily. 11/16/16 12/06/16  Haydee Salter, MD  glimepiride (AMARYL) 4 MG tablet Take 4 mg by mouth daily. 11/02/16   [provider]  hydrALAZINE (APRESOLINE) 25 MG tablet Take 25 mg by mouth 3 (three) times  daily. 11/04/15   [provider]  isosorbide mononitrate (IMDUR) 30 MG 24 hr tablet Take 1 tablet (30 mg total) by mouth daily. 11/17/16 12/07/16  Haydee Salter, MD  pioglitazone (ACTOS) 45 MG tablet Take 45 mg by mouth daily. 09/08/16   [provider]  VENTOLIN HFA 108 (90 Base) MCG/ACT inhaler Inhale 2 puffs into the lungs daily. 11/03/16   [provider]    Family History Family History  Problem Relation Age of Onset  . Hypertension Mother   . Hypertension Maternal Grandfather     Social History Social History  Substance Use Topics  . Smoking status: Current Every Day Smoker    Packs/day: 1.00    Types: Cigarettes  . Smokeless tobacco: Never Used  . Alcohol use No     Allergies   Sulfa antibiotics; Coreg [carvedilol]; and Zinc   Review of Systems Review of Systems  Constitutional: Negative for chills and fever.  Respiratory: Negative for cough and shortness of breath.   Cardiovascular: Negative for chest pain and leg swelling.  Gastrointestinal: Positive for nausea and vomiting. Negative for abdominal pain, constipation and diarrhea.  Genitourinary: Negative for dysuria, flank pain, frequency and hematuria.  Musculoskeletal: Positive for back pain. Negative for myalgias, neck pain and neck stiffness.  Skin: Negative for rash and wound.  Neurological: Negative for dizziness, weakness, light-headedness, numbness and headaches.  All other systems reviewed and are negative.    Physical Exam Updated Vital Signs BP (!) 141/64   Pulse 71   Temp 98.4 F (36.9 C) (Oral)   Resp 12   Wt 108.9 kg (240 lb)   SpO2 94%   BMI 46.87 kg/m   Physical Exam  Constitutional: She is oriented to person, place, and time. She appears well-developed and well-nourished. No distress.  HENT:  Head: Normocephalic and atraumatic.  Mouth/Throat: Oropharynx is clear and moist. No oropharyngeal exudate.  Eyes: Pupils are equal, round, and reactive to light. EOM  are normal.  Neck: Normal range of motion. Neck supple.  Cardiovascular: Normal rate and regular rhythm.  Exam reveals no gallop and no friction rub.   No murmur heard. Pulmonary/Chest: Effort normal and breath sounds normal. No respiratory distress. She has no wheezes. She has no rales. She exhibits no tenderness.  Abdominal: Soft. Bowel sounds are normal. There is no tenderness. There is no rebound and no guarding.  Musculoskeletal: Normal range of motion. She exhibits no edema or tenderness.  No CVA tenderness. Patient has lumbar midline tenderness to palpation. No step-offs or deformity. 1+ bilateral lower extremity pitting edema. No asymmetry or tenderness. Left dorsalis pedis pulse intact. Unable to palpate right dorsalis pedis or posterior tibial pulses. Both feet are warm with good cap refill.  Neurological: She is alert and oriented to person, place, and time.  Moving all extremities without focal deficit. Sensation intact.  Skin: Skin is warm and dry. No rash noted. No erythema.  Psychiatric: She has a normal mood and affect. Her behavior is normal.  Nursing note and vitals reviewed.    ED Treatments / Results  Labs (all labs ordered are listed, but only abnormal results are displayed) Labs Reviewed  COMPREHENSIVE METABOLIC PANEL - Abnormal; Notable for the following:       Result Value   Sodium 132 (*)    Potassium 3.2 (*)    Chloride 92 (*)    Glucose, Bld 196 (*)    BUN 55 (*)    Creatinine, Ser 2.07 (*)    Calcium 8.3 (*)    Albumin 2.7 (*)    GFR calc non Af Amer 22 (*)    GFR calc Af Amer 25 (*)    All other components within normal limits  CBC - Abnormal; Notable for the following:    WBC 11.4 (*)    RBC 3.46 (*)    Hemoglobin 9.2 (*)    HCT 29.5 (*)    RDW 18.4 (*)    All other components within normal limits  PROTIME-INR - Abnormal; Notable for the following:    Prothrombin Time 15.3 (*)    All other components within normal limits  APTT - Abnormal;  Notable for the following:    aPTT 65 (*)    All other components within normal limits  LIPASE, BLOOD  URINALYSIS, ROUTINE W REFLEX MICROSCOPIC    EKG  EKG Interpretation  Date/Time:  Thursday November 24 2016 09:13:07 EDT Ventricular Rate:  75 PR Interval:    QRS Duration: 156 QT Interval:  447 QTC Calculation: 500 R Axis:   38 Text Interpretation:  Atrial flutter Right bundle branch block Confirmed by Loren Racer ([id number]) on 11/24/2016 9:36:28 AM       Radiology Dg Lumbar Spine Complete  Addendum Date: 11/24/2016   ADDENDUM REPORT: 11/24/2016 09:14 ADDENDUM: Study discussed by telephone with Dr. Loren Racer on 11/24/2016 at 0902 hours. Electronically Signed   By: Odessa Fleming M.D.   On: 11/24/2016 09:14   Result Date: 11/24/2016 CLINICAL DATA:  76 year old female with lumbar back pain for 2 days with no known injury. EXAM: LUMBAR SPINE - COMPLETE 4+ VIEW COMPARISON:  Abdomen radiographs 11/07/2016, and earlier chest radiographs. FINDINGS: Large rim calcified infrarenal abdominal aortic aneurysm, up to 77 mm diameter judging from the lateral radiographic appearance. Aortoiliac calcified atherosclerosis. Partially lumbarized S1 level suspected with full size ribs at T12. Otherwise normal lumbar segmentation. Mild grade 1 anterolisthesis of L5 on S1 with advanced disc and posterior element degeneration. Mild disc space loss and endplate spurring elsewhere. Degenerative appearing sclerotic inferior bilateral SI joints. No acute osseous abnormality identified. IMPRESSION: 1. Very large infrarenal abdominal aortic aneurysm. Recommend follow-up CT or CTA. Aortic Atherosclerosis (ICD10-I70.0). 2.  No acute osseous abnormality identified in the lumbar spine. Electronically Signed: By: Odessa Fleming M.D. On: 11/24/2016 08:58    Procedures Procedures (including critical care time)  Medications Ordered in ED Medications  ondansetron (ZOFRAN) injection 4 mg (4 mg Intravenous Given 11/24/16 0805)    ondansetron (ZOFRAN) injection 4 mg (4 mg Intravenous Given 11/24/16 0900)  morphine 4 MG/ML injection 4 mg (4 mg Intravenous Given 11/24/16 0859)   CRITICAL CARE Performed by: Ranae Palms, Charls Custer Total critical care time: 35 minutes Critical care time was exclusive of separately billable procedures and treating other patients. Critical care was necessary to treat or prevent imminent or life-threatening deterioration. Critical care was time spent personally by me on the following activities: development of treatment  plan with patient and/or surrogate as well as nursing, discussions with consultants, evaluation of patient's response to treatment, examination of patient, obtaining history from patient or surrogate, ordering and performing treatments and interventions, ordering and review of laboratory studies, ordering and review of radiographic studies, pulse oximetry and re-evaluation of patient's condition.  Initial Impression / Assessment and Plan / ED Course  I have reviewed the triage vital signs and the nursing notes.  Pertinent labs & imaging results that were available during my care of the patient were reviewed by me and considered in my medical decision making (see chart for details).    X-ray with evidence of large infrarenal abdominal aortic aneurysm. Patient has worsening renal function on laboratory workup and drop in hemoglobin from 11 to 9.  Discussed with basket surgery on-call Dr. Imogene Burn. Recommends transfer to [facility] emergency department for completion of workup. Dr. Freida Busman, emergency physician except the patient transfer. Discussed with patient the findings on x-ray. Discussed that further workup included CT angiogram of the aorta which may further worsen her renal function and the ultimately lead to dialysis. Confirmed that the patient is not on anticoagulants. Patient is in agreement with transfer.  Final Clinical Impressions(s) / ED Diagnoses   Final diagnoses:  Abdominal  aortic aneurysm (AAA) greater than 5.0 cm in diameter in female [facility])    New Prescriptions New Prescriptions   No medications on file     Loren Racer, MD 11/24/16 [phone]

## 2016-11-24 NOTE — ED Notes (Signed)
Sue Roach, patient's son can be reached here. [phone]. Would like to be kept informed about care. Patient has given permission for information to be given.

## 2016-11-24 NOTE — ED Notes (Signed)
ED Provider at bedside. 

## 2016-11-24 NOTE — Transfer of Care (Signed)
Immediate Anesthesia Transfer of Care Note  Patient: Derald Macleod  Procedure(s) Performed: ABDOMINAL AORTIC ENDOVASCULAR STENT GRAFT REPAIR (N/A Abdomen)  Patient Location: PACU  Anesthesia Type:General  Level of Consciousness: awake, oriented and patient cooperative  Airway & Oxygen Therapy: Patient Spontanous Breathing and Patient connected to nasal cannula oxygen  Post-op Assessment: Report given to RN, Post -op Vital signs reviewed and stable and Patient moving all extremities  Post vital signs: Reviewed and stable  Last Vitals:  Vitals:   11/24/16 1230 11/24/16 1245  BP: 138/75 139/88  Pulse: 89 (!) 106  Resp: (!) 21 18  Temp:    SpO2: 98% 100%    Last Pain:  Vitals:   11/24/16 1435  TempSrc:   PainSc: 4       Patients Stated Pain Goal: 2 (11/24/16 1435)  Complications: No apparent anesthesia complications

## 2016-11-24 NOTE — Anesthesia Procedure Notes (Signed)
Central Venous Catheter Insertion Performed by: Kipp Brood, anesthesiologist Start/End10/05/2016 3:40 PM, 11/24/2016 3:45 AM Patient location: OR. Preanesthetic checklist: patient identified, IV checked, site marked, risks and benefits discussed, surgical consent, monitors and equipment checked, pre-op evaluation and timeout performed Position: supine Hand hygiene performed , maximum sterile barriers used  and Seldinger technique used Catheter size: 12 Fr Central line was placed.Triple lumen Procedure performed using ultrasound guided technique. Ultrasound Notes:anatomy identified, needle tip was noted to be adjacent to the nerve/plexus identified and no ultrasound evidence of intravascular and/or intraneural injection Attempts: 1 Following insertion, line sutured, dressing applied and Biopatch. Post procedure assessment: blood return through all ports, free fluid flow and no air  Patient tolerated the procedure well with no immediate complications.

## 2016-11-24 NOTE — Anesthesia Postprocedure Evaluation (Signed)
Anesthesia Post Note  Patient: Sue Roach  Procedure(s) Performed: ABDOMINAL AORTIC ENDOVASCULAR STENT GRAFT REPAIR (N/A Abdomen)     Patient location during evaluation: PACU Anesthesia Type: General Level of consciousness: awake, awake and alert and oriented Pain management: pain level controlled Vital Signs Assessment: post-procedure vital signs reviewed and stable Respiratory status: spontaneous breathing, nonlabored ventilation and respiratory function stable Cardiovascular status: blood pressure returned to baseline Anesthetic complications: no    Last Vitals:  Vitals:   11/24/16 1952 11/24/16 2011  BP:    Pulse: 96 95  Resp: (!) 21 18  Temp:  36.8 C  SpO2: 94% 93%    Last Pain:  Vitals:   11/24/16 2011  TempSrc:   PainSc: Asleep                 Zeba Luby COKER

## 2016-11-24 NOTE — ED Notes (Signed)
Dr. Chen at bedside.

## 2016-11-24 NOTE — Anesthesia Procedure Notes (Signed)
Arterial Line Insertion Start/End10/05/2016 3:28 PM, 11/24/2016 3:31 PM Performed by: Vinnie Langton J  Patient location: OR. Preanesthetic checklist: patient identified, IV checked, site marked, risks and benefits discussed, surgical consent, monitors and equipment checked, pre-op evaluation and anesthesia consent Lidocaine 1% used for infiltration Left, radial was placed Catheter size: 20 G Hand hygiene performed  and maximum sterile barriers used   Attempts: 1 Procedure performed without using ultrasound guided technique. Ultrasound Notes:anatomy identified and no ultrasound evidence of intravascular and/or intraneural injection Following insertion, dressing applied and Biopatch. Post procedure assessment: normal  Patient tolerated the procedure well with no immediate complications.

## 2016-11-24 NOTE — ED Notes (Signed)
Patient transported to CT with RN 

## 2016-11-24 NOTE — Progress Notes (Signed)
   Daily Progress Note   Assessment/Planning:   POD #0 s/p EVAR   Both feet look viable with dopplerable signal  Pt's UOP good and pt remains hemodynamically stable  CBC pending  1 u pRBC pending  Pt's family updated on operative findings and outcome   Subjective  - Day of Surgery   No c/o leg pain, persistent back pain with nausea   Objective   Vitals:   11/24/16 1815 11/24/16 1830 11/24/16 1844 11/24/16 1845  BP: (!) 129/59 (!) 119/56  (!) 137/59  Pulse: (!) 112 91 (!) 51 90  Resp: (!) 28 (!) Temp: (!) 97 F (36.1 C) (!) 97.3 F (36.3 C)    TempSrc: Temporal Temporal    SpO2: 94% 95% 92% 93%  Weight:      Height:         Intake/Output Summary (Last 24 hours) at 11/24/16 1859 Last data filed at 11/24/16 1830  Gross per 24 hour  Intake             1215 ml  Output              375 ml  Net              840 ml    PULM  B rales  CV  RRR  GI  soft, NTND, -G/R  VASC B groins c/d/i, both feet appear viable    Laboratory   CBC CBC Latest Ref Rng & Units 11/24/2016 11/16/2016 11/15/2016  WBC 4.0 - 10.5 K/uL 11.4(H) 8.4 8.3  Hemoglobin 12.0 - 15.0 g/dL 5.3(G) 11.3(L) 11.1(L)  Hematocrit 36.0 - 46.0 % 29.5(L) 37.1 37.6  Platelets 150 - 400 K/uL 245 274 283    BMET    Component Value Date/Time   NA 132 (L) 11/24/2016 0802   NA 140 02/11/2016   K 3.2 (L) 11/24/2016 0802   CL 92 (L) 11/24/2016 0802   CO2 28 11/24/2016 0802   GLUCOSE 196 (H) 11/24/2016 0802   BUN 55 (H) 11/24/2016 0802   BUN 55 (A) 02/11/2016   CREATININE 2.07 (H) 11/24/2016 0802   CALCIUM 8.3 (L) 11/24/2016 0802   GFRNONAA 22 (L) 11/24/2016 0802   GFRAA 25 (L) 11/24/2016 0802     Leonides Sake, MD, FACS [facility] Office: [phone] Pager: [phone]  11/24/2016, 6:59 PM

## 2016-11-24 NOTE — Anesthesia Procedure Notes (Signed)
Procedure Name: Intubation Date/Time: 11/24/2016 3:34 PM Performed by: Rise Patience T Pre-anesthesia Checklist: Patient identified, Emergency Drugs available, Suction available and Patient being monitored Patient Re-evaluated:Patient Re-evaluated prior to induction Oxygen Delivery Method: Circle System Utilized Preoxygenation: Pre-oxygenation with 100% oxygen Induction Type: IV induction, Rapid sequence and Cricoid Pressure applied Laryngoscope Size: Glidescope and 4 Grade View: Grade I Tube type: Subglottic suction tube Tube size: 7.5 mm Number of attempts: 1 Airway Equipment and Method: Stylet and Oral airway Placement Confirmation: ETT inserted through vocal cords under direct vision,  positive ETCO2 and breath sounds checked- equal and bilateral Secured at: 22 cm Tube secured with: Tape Dental Injury: Teeth and Oropharynx as per pre-operative assessment

## 2016-11-24 NOTE — ED Notes (Signed)
Pt vomiting upon return from xray. EDP made aware.

## 2016-11-24 NOTE — Progress Notes (Signed)
RN called patient's son, Doreatha Martin, again and got a hold of him. Patient was very concerned about where her belongings were because they were not at the bedside. Her son let patient know that all of her belongings were sent home with her daughter.   Now that patient knows that her belongings and cell phone are home, she is less agitated.   Valree Feild E Mylinda Latina, [place]

## 2016-11-24 NOTE — ED Notes (Signed)
Patient transported to X-ray 

## 2016-11-24 NOTE — ED Triage Notes (Signed)
Per EMS, pt received flu shot Monday, c/o N/V since Tuesday. Also endorses back pain.

## 2016-11-24 NOTE — Progress Notes (Signed)
RN called pharmacy because patient's medications had not been verified since she was admitted to our unit. This was the second time RN called to ask about medications that could not be administered on time.   Per pharmacy, there was nobody to verify patient's home med list. Pharmacy went ahead and verified patient's blood pressure medications and everything else to be verified on day shift.   Nickolis Diel E Mylinda Latina, [place]

## 2016-11-24 NOTE — Consult Note (Addendum)
Requested by: Dr. Ranae Palms ([facility])  Reason for consultation: large sx AAA   History of Present Illness   The patient is a 76 y.o. (02/03/1941) female with CKD, CHF, COPD and new onset aflutter who presents with chief complaint: back pain since Tuesday.  The patient notes baseline chronic back pain, but notes this new pain is unlike her prior pain.  The pain is vague in character, persistent moderate to severe in intensity.  Patient thinks the pain has increased since Tuesday.  The patient's risk factors for AAA included: active smoking.  The patient was recently admitted with acute on chronic CHF.  Past Medical History:  Diagnosis Date  . CHF (congestive heart failure) (HCC)   . Diabetes mellitus without complication (HCC)   . Hypertension   . Kidney disease   . Renal disorder   . Shingles    Past Surgical History: None reported  Social History   Social History  . Marital status: Widowed    Spouse name: N/A  . Number of children: N/A  . Years of education: N/A   Occupational History  . Not on file.   Social History Main Topics  . Smoking status: Current Every Day Smoker    Packs/day: 1.00    Types: Cigarettes  . Smokeless tobacco: Never Used  . Alcohol use No  . Drug use: No  . Sexual activity: Not on file   Other Topics Concern  . Not on file   Social History Narrative  . No narrative on file    Family History  Problem Relation Age of Onset  . Hypertension Mother   . Hypertension Maternal Grandfather     Current Facility-Administered Medications  Medication Dose Route Frequency Provider Last Rate Last Dose  . iopamidol (ISOVUE-370) 76 % injection           . ondansetron (ZOFRAN) 4 MG/2ML injection            Current Outpatient Prescriptions  Medication Sig Dispense Refill  . acetaminophen (TYLENOL) 325 MG tablet Take 2 tablets (650 mg total) by mouth every 4 (four) hours as needed for mild pain, moderate pain, fever or headache.     Marland Kitchen aspirin EC 81 MG tablet Take 81 mg by mouth daily.    . benzonatate (TESSALON) 100 MG capsule Take 1 capsule (100 mg total) by mouth 3 (three) times daily as needed for cough. 20 capsule 0  . carvedilol (COREG) 3.125 MG tablet Take 3.125 mg by mouth 2 (two) times daily.  0  . docusate sodium (COLACE) 100 MG capsule Take 100 mg by mouth 2 (two) times daily as needed for constipation.    . furosemide (LASIX) 40 MG tablet Take 3 tablets (120 mg total) by mouth 2 (two) times daily. 120 tablet 0  . glimepiride (AMARYL) 4 MG tablet Take 4 mg by mouth daily.  1  . hydrALAZINE (APRESOLINE) 25 MG tablet Take 25 mg by mouth 3 (three) times daily.  0  . isosorbide mononitrate (IMDUR) 30 MG 24 hr tablet Take 1 tablet (30 mg total) by mouth daily. 20 tablet 0  . pioglitazone (ACTOS) 45 MG tablet Take 45 mg by mouth daily.  0  . VENTOLIN HFA 108 (90 Base) MCG/ACT inhaler Inhale 2 puffs into the lungs daily.  1     Allergies  Allergen Reactions  . Sulfa Antibiotics Other (See Comments)    unknown  . Coreg [Carvedilol] Rash    Hair loss  .  Zinc Rash    REVIEW OF SYSTEMS (negative unless checked):   Cardiac:  []  Chest pain or chest pressure? [x]  Shortness of breath upon activity? [x]  Shortness of breath when lying flat? [x]  Irregular heart rhythm?  Vascular:  []  Pain in calf, thigh, or hip brought on by walking? []  Pain in feet at night that wakes you up from your sleep? []  Blood clot in your veins? [x]  Leg swelling?  Pulmonary:  [x]  Oxygen at home? []  Productive cough? []  Wheezing?  Neurologic:  []  Sudden weakness in arms or legs? []  Sudden numbness in arms or legs? []  Sudden onset of difficult speaking or slurred speech? []  Temporary loss of vision in one eye? []  Problems with dizziness?  Gastrointestinal:  []  Blood in stool? []  Vomited blood?  Genitourinary:  []  Burning when urinating? []  Blood in urine?  Psychiatric:  []  Major depression  Hematologic:  []  Bleeding  problems? []  Problems with blood clotting?  Dermatologic:  []  Rashes or ulcers?  Constitutional:  []  Fever or chills?  Ear/Nose/Throat:  []  Change in hearing? []  Nose bleeds? []  Sore throat?  Musculoskeletal:  [x]  Back pain? []  Joint pain? []  Muscle pain?   For VQI Use Only   PRE-ADM LIVING Home  AMB STATUS Ambulatory  CAD Sx None  PRIOR CHF Moderate  STRESS TEST No   Physical Examination     Vitals:   11/24/16 0858 11/24/16 0913 11/24/16 1033 11/24/16 1035  BP: (!) 170/60 (!) 141/64 (!) 128/56 (!) 169/71  Pulse: 95 71 70 62  Resp: 18 12 17 15   Temp:   98 F (36.7 C) 98 F (36.7 C)  TempSrc:   Oral   SpO2: 95% 94% 94% 98%  Weight:       Body mass index is 46.87 kg/m.  General Alert, O x 3, Obese, afraid  Head [place]/AT,    Ear/Nose/ Throat Hearing grossly intact, nares without erythema or drainage, oropharynx without Erythema or Exudate, Mallampati score: 3,   Eyes PERRLA, EOMI,    Neck Supple, mid-line trachea,    Pulmonary Sym exp, Decreased upper air movt, distal BS  Cardiac Irregularly, irregular rate and rhythm, no Murmurs, No rubs, No S3,S4  Vascular Vessel Right Left  Radial Palpable Palpable  Brachial Palpable Palpable  Carotid Palpable, No Bruit Palpable, No Bruit  Aorta Not palpable N/A  Femoral Not palpable, as pannus interferes with exam Not palpable, as pannus interferes with exam  Popliteal Not palpable Not palpable  PT Not palpable Not palpable  DP Not palpable Not palpable    Gastro- intestinal soft, non-distended, non-tender to palpation, No guarding or rebound, no HSM, no masses, no CVAT B, No palpable prominent aortic pulse, large pannus    Musculo- skeletal M/S 5/5 throughout  , Extremities without ischemic changes  , Non-pitting edema present: 1-2+ B, Varicosities present: B, extensive spider veins B, B stasis dermatitis  Neurologic Cranial nerves 2-12 intact grossly, Pain and light touch intact in extremities , Motor exam as listed  above  Psychiatric Judgement intact, Mood & affect appropriate for pt's clinical situation  Dermatologic See M/S exam for extremity exam, R foot demonstrates some black eschar (reported due to recent trauma to foot)  Lymphatic  Palpable lymph nodes: None    Laboratory   CBC CBC Latest Ref Rng & Units 11/24/2016 11/16/2016 11/15/2016  WBC 4.0 - 10.5 K/uL 11.4(H) 8.4 8.3  Hemoglobin 12.0 - 15.0 g/dL 1.6(X) 11.3(L) 11.1(L)  Hematocrit 36.0 - 46.0 % 29.5(L) 37.1 37.6  Platelets 150 - 400 K/uL 245 274 283    BMP BMP Latest Ref Rng & Units 11/24/2016 11/16/2016 11/15/2016  Glucose 65 - 99 mg/dL 621(H) 086(V) 93  BUN 6 - 20 mg/dL 78(I) 69(G) 29(B)  Creatinine 0.44 - 1.00 mg/dL 2.84(X) 3.24(M) 0.10(U)  Sodium 135 - 145 mmol/L 132(L) 137 139  Potassium 3.5 - 5.1 mmol/L 3.2(L) 5.3(H) 4.4  Chloride 101 - 111 mmol/L 92(L) 94(L) 93(L)  CO2 22 - 32 mmol/L 28 35(H) 36(H)  Calcium 8.9 - 10.3 mg/dL 8.3(L) 8.3(L) 8.2(L)    Coagulation Lab Results  Component Value Date   INR 1.22 11/24/2016   INR 2.01 11/16/2016   INR 1.79 11/15/2016   No results found for: PTT  Lipids No results found for: CHOL, TRIG, HDL, CHOLHDL, VLDL, LDLCALC, LDLDIRECT  Radiology     Dg Lumbar Spine Complete  Addendum Date: 11/24/2016   ADDENDUM REPORT: 11/24/2016 09:14 ADDENDUM: Study discussed by telephone with Dr. Loren Racer on 11/24/2016 at 0902 hours. Electronically Signed   By: Odessa Fleming M.D.   On: 11/24/2016 09:14   Result Date: 11/24/2016 CLINICAL DATA:  76 year old female with lumbar back pain for 2 days with no known injury. EXAM: LUMBAR SPINE - COMPLETE 4+ VIEW COMPARISON:  Abdomen radiographs 11/07/2016, and earlier chest radiographs. FINDINGS: Large rim calcified infrarenal abdominal aortic aneurysm, up to 77 mm diameter judging from the lateral radiographic appearance. Aortoiliac calcified atherosclerosis. Partially lumbarized S1 level suspected with full size ribs at T12. Otherwise normal lumbar  segmentation. Mild grade 1 anterolisthesis of L5 on S1 with advanced disc and posterior element degeneration. Mild disc space loss and endplate spurring elsewhere. Degenerative appearing sclerotic inferior bilateral SI joints. No acute osseous abnormality identified. IMPRESSION: 1. Very large infrarenal abdominal aortic aneurysm. Recommend follow-up CT or CTA. Aortic Atherosclerosis (ICD10-I70.0). 2.  No acute osseous abnormality identified in the lumbar spine. Electronically Signed: By: Odessa Fleming M.D. On: 11/24/2016 08:58   Ct Angio Chest/abd/pel For Dissection W And/or W/wo  Result Date: 11/24/2016 CLINICAL DATA:  76 year old female with a history of back pain. Prior plain film radiograph demonstrates abdominal aortic aneurysm. EXAM: CT ANGIOGRAPHY CHEST, ABDOMEN AND PELVIS TECHNIQUE: Multidetector CT imaging through the chest, abdomen and pelvis was performed using the standard protocol during bolus administration of intravenous contrast. Multiplanar reconstructed images and MIPs were obtained and reviewed to evaluate the vascular anatomy. CONTRAST:  100 cc Isovue 370 COMPARISON:  None. FINDINGS: CTA CHEST FINDINGS Cardiovascular: Heart: Cardiomegaly. No pericardial fluid/ thickening. Minimal calcifications of the right coronary artery. Calcifications of the aortic valve. Aorta: Ascending aorta measures 3.3 cm with no dissection. Mild calcifications of the aortic arch. Branch vessels are patent with calcifications of the innominate artery, left common carotid artery origin, left subclavian artery origin. Branch vessels patent. Proximal left and right common carotid arteries patent. Proximal left and right vertebral arteries patent. There is symmetric contrast within the left and right subclavian artery with no occlusion identified. Irregular soft plaque throughout the length of the descending thoracic aorta with no dissection identified. No aneurysm of the thoracic aorta. No periaortic fluid at the descending  thoracic aorta. Pulmonary arteries: Main pulmonary artery measures 4.2 cm. The pulmonary arteries are enlarged at the hour 1/3 of lung, greater than the accompanying bronchi. No filling defects of the main pulmonary artery, lobar, segmental or proximal subsegmental pulmonary arteries. Mediastinum/Nodes: Fluid within the pericardial recesses. There are several mediastinal lymph nodes with calcifications. No enlarged mediastinal lymph nodes. Unremarkable thyroid and thoracic  inlet. Lungs/Pleura: Respiratory motion somewhat limits evaluation of the lungs. Atelectasis/scarring at the lung bases. No pleural effusion. No evidence of lobar pneumonia. No pneumothorax. Musculoskeletal: No displaced fracture. Degenerative changes of the spine. Review of the MIP images confirms the above findings. CTA ABDOMEN AND PELVIS FINDINGS VASCULAR Aorta: Infrarenal abdominal aortic aneurysm with the greatest diameter measuring approximately 6.1 cm. Irregular calcified and soft plaque along the length of the abdominal aorta. No significant circumferential thrombus at the aneurysm sac. There is circumferential edema/inflammatory change at the aneurysm sac with no periaortic fluid. No evidence of dissection or intramural hematoma. Celiac: Atherosclerotic changes at the origin of the celiac artery which remains patent. SMA: Atherosclerotic changes at the origin of the superior mesenteric artery which remains patent. Renals: Single renal arteries bilaterally with mild atherosclerotic changes at the origin. There may be 50% stenosis on the left. IMA: Inferior mesenteric arteries occluded at the origin. Reconstitution of left colic artery and superior rectal artery a via collateral flow. Right lower extremity: Patent right common iliac artery with calcified and soft plaque. Hypogastric artery is patent as well as the branch vessels into the pelvis. External iliac artery is patent. Common femoral artery patent with mild atherosclerotic changes.  Proximal SFA and profunda femoris patent. Left lower extremity: Patent left common iliac artery with calcified and soft plaque. Hypogastric artery is patent. External iliac artery patent. Mild atherosclerotic changes of the common femoral artery. Profunda femoris and the proximal superficial femoral artery patent. Veins: Unremarkable appearance of the venous system. Review of the MIP images confirms the above findings. NON-VASCULAR Hepatobiliary: Cranial caudal span of the right liver margin measures 19.5 cm. Diffusely low-density liver parenchyma. Unremarkable appearance of the gallbladder with no inflammatory changes. Pancreas: Unremarkable pancreas Spleen: Punctate calcifications within the spleen compatible with prior granulomatous disease. Adrenals/Urinary Tract: Unremarkable appearance of adrenal glands. Right: Renal cortical thinning. Low-density rounded cyst on the posterior cortex of the right kidney compatible of Bosniak 1 cyst. No hydronephrosis or nephrolithiasis. Unremarkable course of the right ureter. Left: Symmetric perfusion of the left kidney to the right. Renal cortical thinning. No hydronephrosis or nephrolithiasis. Unremarkable course of the left ureter. Unremarkable appearance of the urinary bladder . Stomach/Bowel: Unremarkable appearance of the stomach. Unremarkable appearance of small bowel. No evidence of obstruction. Normal appendix. Colonic diverticula. No associated inflammatory changes. Lymphatic: No lymphadenopathy Mesenteric: No lymph nodes within the mesenteric. No free fluid or free air. Reproductive: Unremarkable uterus and adnexae Other: Small fat containing umbilical hernia Musculoskeletal: No acute fracture. Degenerative changes throughout the visualized thoracolumbar spine. No bony canal narrowing. Facet changes bilaterally of the lower lumbar spine. Vacuum disc phenomenon throughout the lumbar spine. IMPRESSION: Infrarenal abdominal aortic aneurysm measuring 6.1 cm. There are  mild inflammatory changes/ edema surrounding the aneurysm sac. Although nonspecific, this could represent mycotic aneurysm or other nonspecific inflammatory changes. Extensive irregular plaque of the descending thoracic aorta and the suprarenal abdominal aorta with no dissection identified. These results were called by telephone at the time of interpretation on 11/24/2016 at 11:39 am to Dr. Lorre Nick , who verbally acknowledged these results. Mild bilateral iliac and common femoral artery disease. 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 Cardiomegaly with single vessel coronary artery disease. Evidence of pulmonary hypertension. Referral for pulmonary evaluation may be useful. Calcified mediastinal lymph nodes and spleen calcifications compatible with prior granulomatous disease. Hepatomegaly and liver steatosis. Diverticular disease without evidence of acute diverticulitis. Signed,  Yvone Neu. Loreta Ave, DO [facility] Radiology Electronically Signed   By: Gilmer Mor D.O.   On: 11/24/2016 11:44   I reviewed this patient's CTA Chest/abd/pelvis.  She has no significant intra-abdominal pathology in her bowel or visceral organ that I can see that would account for her sx.  Her AAA appears intact without any bleeding.  There is some periaortic inflammatory changes but I don't see anything that would make me think mycotic.  I think her anatomy is compatible with EVAR.  The mesenteric segment appears to be somewhat aneurysmal also, but I don't think this needs to be addressed immediately.  Additionally there is extensive atherosclerotic plaque in the suprarenal aorta extending all the way into the aortic arch.   Medical Decision Making   Sue Roach is a 76 y.o. (Jul 15, 1940) female  who presents with: possibly sx large AAA, CHF, CKD Stage IV, COPD, HTN, asx blood  pressures   Pt's sx and decreasing H/H may be consistent with contained aortic rupture, though the 2 day duration would argue against such.  I recommend stat Abd/pelvis CTA with low contrast protocol.  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  I explicitedly discussed this with the patient.  As the only thing that has ever improved survival from rAAA is EVAR, the CTA is necessary regardless of the risk of ESRD.  I doubt this patient would survive an open AAA repair given recent admission for new onset aflutter and Acute of chronic heart failure.  Thank you for allowing [place] to participate in this patient's care.   Leonides Sake, MD, FACS [facility] [place] Office: [phone] Pager: [phone]  11/24/2016, 10:49 AM    Addendum  Based on the CTA, I suspect she has an sx inflammatory AAA that is amendable to endovascular aortic repair (EVAR).  I discussed her options and she agrees to proceed with EVAR.   The patient is aware the risks of endovascular aortic surgery include but are not limited to: bleeding, need for transfusion, infection, death, stroke, paralysis, wound complications, spinal, pelvic and bowel ischemia, extended ventilation, anaphylactic reaction to contrast, contrast induced nephropathy, embolism, and need for additional procedure to address endoleaks.    Overall, I cited a mortality rate of 1-2% and morbidity rate of 15% for EVAR.  In reality, her co-morbidities would likely increase both her mortality and morbidity given their severity.    Pt agrees to proceed with EVAR.  This patient is at risk for extended hospitalization post-op due to her multiple co-morbidities, especially her CKD Stage IV that may progress into ESRD after CTA and EVAR contrast load.  A worsening renal status may decompensate her CHF.     Leonides Sake, MD, FACS [facility] [place] Office:  [phone] Pager: [phone]  11/24/2016, 1:28 PM

## 2016-11-24 NOTE — ED Provider Notes (Addendum)
[facility] 7.7 cm aneurysmseen on lumbar sacral films. Patient had been having back pain. She was sent here to be seen by the vascular surgeon. Dr. Valeta Harms seen the patient and recommends a CT angiogram of her entire aorta. Patient has a GFR below 30 and therefore is at increased risk for renal failure.I have discussed this case with radiology as well and they agree that given the patient's emergent condition that the test is warranted. This was discussed with the patient by myself as well as Dr. Imogene Burn. Patient accepts the risk that the study needed to evaluate her aorta could result in her developing renal failure. scan is pending at this time and Dr. Imogene Burn to follow-up  12:47 PM Spoke with dr Imogene Burn about the ct angio results, no evidence of acute disection and he will come down to dispo pt   Lorre Nick, MD 11/24/16 1050    Lorre Nick, MD 11/24/16 1248

## 2016-11-24 NOTE — Anesthesia Preprocedure Evaluation (Addendum)
Anesthesia Evaluation  Patient identified by MRN, date of birth, ID band Patient awake    Reviewed: Allergy & Precautions, NPO status , Patient's Chart, lab work & pertinent test results  Airway Mallampati: III  TM Distance: >3 FB Neck ROM: Full    Dental  (+) Teeth Intact Cracked R. Marland Kitchen Upper molar:   Pulmonary Current Smoker,     + decreased breath sounds      Cardiovascular hypertension,  Rhythm:Irregular Rate:Normal     Neuro/Psych    GI/Hepatic   Endo/Other  diabetes  Renal/GU      Musculoskeletal   Abdominal (+) + obese,   Peds  Hematology   Anesthesia Other Findings   Reproductive/Obstetrics                            Anesthesia Physical Anesthesia Plan  ASA: IV and emergent  Anesthesia Plan: General   Post-op Pain Management:    Induction: Intravenous  PONV Risk Score and Plan: 0 and Ondansetron and Metaclopromide  Airway Management Planned: Oral ETT  Additional Equipment: Arterial line and CVP  Intra-op Plan:   Post-operative Plan: Possible Post-op intubation/ventilation  Informed Consent: I have reviewed the patients History and Physical, chart, labs and discussed the procedure including the risks, benefits and alternatives for the proposed anesthesia with the patient or authorized representative who has indicated his/her understanding and acceptance.   Dental advisory given  Plan Discussed with: CRNA and Anesthesiologist  Anesthesia Plan Comments:        Anesthesia Quick Evaluation

## 2016-11-24 NOTE — Op Note (Signed)
OPERATIVE NOTE   PROCEDURE: 1. Left common femoral artery cannulation under ultrasound guidance 2. "Preclose" repair of left common femoral artery, i.e. proglide placement x 2 3. Endovascular aortic repair with main body with attached limb (Gore C3 28 mm x 14 mm x 12 cm) 4. Placement of ipsilateral iliac limb extension (Gore 12 mm x 10 cm)  (To be dictated by Dr. Edilia Bo) 5. Left common femoral artery cannulation under ultrasound guidance 6. "Preclose" repair of left common femoral artery, i.e. proglide placement x 2 7. Placement contralateral iliac limb (Gore 16 mm x 11.5 cm)  PRE-OPERATIVE DIAGNOSIS: symptomatic abdominal aortic aneurysm   POST-OPERATIVE DIAGNOSIS: same as above   CO-SURGEONS:  Leonides Sake, MD, Cari Caraway, MD  ANESTHESIA: general  ESTIMATED BLOOD LOSS: 100 cc  FINDING(S): 1.  Successful exclusion of aneurysm at end of case  2.  Endoleaks: small type 2 endoleak proximally 3.  Bilateral patent renal arteries and internal iliac arteries 4.  Left renal artery stenosis >70% 5.  Distal pedal signals: left dorsalis pedis artery dopplerable, right anterior tibial artery and posterior tibial artery dopplerable  SPECIMEN(S):  none  INDICATIONS:   Sue Roach is a 76 y.o. (1941/01/21) female who presents with symptomatic large abdominal aortic aneurysm.  I recommended endovascular aortic repair as there was no findings on CTA to account for her back pain.  The patient is aware the risks of endovascular aortic surgery include but are not limited to: bleeding, need for transfusion, infection, death, stroke, paralysis, wound complications, spinal, pelvic and bowel ischemia, extended ventilation, anaphylactic reaction to contrast, contrast induced nephropathy, embolism, and need for additional procedure to address endoleaks.  Overall, I cited a mortality rate of 1-2% and morbidity rate of 15% for [facility]AR.  Due to this patient significant co-morbidities  of chronic kidney disease stage IV, congestive heart failure, and COPD, I discussed with the patient her mortality and morbiditiy risks were significantly higher.  The patient is aware of the risks and agree to proceed.   DESCRIPTION: After obtaining full informed written consent, the patient was brought back to the operating room and placed supine upon the operating table.  The patient received IV antibiotics prior to induction.  A procedure time out was completed and the correct surgical site was verified.  After obtaining adequate anesthesia, the patient was prepped and draped in the standard fashion for: open or endovascular aortic procedure.  A two surgeon technique was utilized to maintain constant control of the main body of the endograft and to expedite completion of this case.  Ipsilateral Access I turned my attention to the left groin  Under ultrasound guidance,  the common femoral artery was then cannulated with a micropuncture needle.  The microwire was passed into the iliac arterial system and then the needle was exchanged for the microsheath over the wire.  The wire was exchanged for the Bentson wire which was passed up into the aorta.  The needle was exchanged for a 8-Fr dilator, which was used to dilate the subcutaneous tract and arterial puncture.  The dilator was exchanged for a Proglide device.  This was deployed at 30 degrees medial rotation.  The Proglide sutures were tagged and then the Bentson wire was reloaded in the used Proglide device.  The used device was exchanged for a new Proglide device.  The new device was deployed at 30 degrees lateral rotation.  The new Proglide sutures were tagged and then the Bentson wire was reloaded in the Proglide device.  The device was exchanged for a short 8-Fr sheath.  In this fashion, the "Preclose" technique repair of the common femoral artery was completed.     Contralateral Access Dr. Edilia Bo repeat this Preclose technique on the right  common femoral artery.  Multiple Proglide devices were necessary due to device failure, likely due to calcific disease in the artery.  Heparinization The patient was given 11000 units of Heparin intravenously, which was a therapeutic bolus.   Placement of Main Body of Endograft and Initial Aortogram I placed a BER-2 catheter over the left wire and then exchanged the wire in the suprarenal segment of the aorta for an Amplatz wire which was advanced into the descending thoracic aorta.  I avoided entering into the aortic arch due to the disease present on CTA.  The position of the wire was marked to avoid advancing the wire into the aortic arch.  The catheter was removed and then the sheath was exchanged for a 18-Fr Dryseal sheath.  The dilator was removed and the sheath loaded with heparinized saline.    At this point, a BER-2 catheter was loaded over the right wire and used with the the Bentson wire to steering into the suprarenal segment of the aorta.  Dr. Edilia Bo exchanged the wire for an Amplatz wire which was parked at the same level as the left Amplatz wire.  The sheath was exchanged for a 12-Fr Dryseal sheath.  He then loaded a marker pigtail catheter into the presumed immediate suprarenal position.  The wire was removed and the pigtail catheter was connected to the power injector circuit.  The main body of the endograft (C3 28 mm x 14 mm x 12 cm) was loaded over left wire into the presumed infrarenal position.  A power injector aortogram was completed at appropriate angulation to evaluate the takeoff of the renal arteries.    I deployed the main body at the level of the right renal artery which the lowest.  A completion aortogram was completed which demonstrated: slight coverage of right renal artery.  Based on the images, I reconstrained the C3 main body and positioned the graft slightly lower.  I released the main body.  A repeat aortogram was completed which demonstrated: bilateral renal patency  without coverage of any of right renal artery lumen.Elnita Maxwell Wire Placement and Contralateral Gate Cannnulation At this point, Dr. Edilia Bo used the buddy wire technique to place a BER-2 catheter and Glidewire adjacent to the Amplatz wire.  Using this combination, he placed the wire adjacent to the contralateral gate.  Eventually using a BER-2 and a Glide wire, I was able to cannulate the contralateral gate.  I advanced the wire and catheter into the main body without difficulty.  The wire was exchanged for the Amplatz wire.  He exchanged the catheter for a pigtail catheter.  He pulled the Amplatz wire back and allowed the crook of the catheter to reform.  He spun the formed catheter at the top of the main body, demonstrating intragraft location.  He replaced the Amplatz wire in the descending thoracic aorta and then pulled the marker pigtail catheter down to the flow divider.    Contralateral Limb Placement Dr. Edilia Bo then did a left anterior oblique pelvic injection to image the takeoff of the contralateral internal iliac artery.  Based on the measurements, I selected a 16 mm x 11.5 cm iliac limb.  This was deployed with adequate overlap by Dr. Edilia Bo.  The stent delivery device was removed.  Ipsilateral Limb Extension At this point, I turned my attention to the main body.  I fully deployed the ipsilateral limb and then I did a right anterior oblique pelvic injection to image the takeoff of the left internal iliac artery.  Based on the images, I selected a 12 mm x 10 cm iliac limb extension and deployed it just proximal to the internal iliac artery.  The stent delivery device was removed.  Aortic and Limb Molding The Q50 molding balloon was loaded on the contralateral wire and advanced into the main body.  I molded the proximal graft, overlapping segments just distal to the flow divider and the distal extent of the graft in the contralateral iliac artery.  The balloon was deflated and placed on the  ipsilateral wire.  I inflated the balloon at the overlapping segment in the ipsilateral limb and in the distal extent of the ipsilateral iliac artery.  The balloon was deflated and removed.  Completion Aortogram The pigtail catheter was replaced on the contralateral wire and advanced into the suprarenal aorta.  The wire was removed and the catheter was connected to the power injector circuit.  A power injector aortogram was completed: complete exclusion of the aneurysm sac, small type 2 endoleak was evident with intact bilateral renal artery flow and bilateral internal iliac artery flow.  I replaced the Bentson wire into the catheter, straightening out the crook in the catheter.  I removed the catheter.  I then placed a BER-2 catheter over the right wire and then exchanged the wire for a Bentson wire.    Completion of Pre-close Repair of Common Femoral Arteries At this point, I gave 50 mg of Protamine to reverse anticoagulation.  I turned my attention to the ipsilateral groin.  Dr. Edilia Bo held pressure proximally and distally on this common femoral artery as I removed the femoral sheath.  There was no change in blood pressure with this maneuver.  I then sequentially tightened the previously tagged Proglide sutures.  There was significant bleeding from the left groin, so I elected to deploy another Proglide over the wire, as previously described.  I sequentially tightened the 3 pairs of Proglide stitch.  At this point, there was minimal bleeding with the wire still in place, so I removed the wire.  I sequentially tightened the previous tagged Proglide sutures again and locked each set of sutures by applying tension to the locking stitch in each pair.  In this fashion, I repaired the ipsilateral common femoral artery.    This exact same process was completed in the contralateral groin by Dr. Edilia Bo, after removing the sheath on that side.  Dr. Edilia Bo also had to deploy an additional Proglide device as the  suture broke on one knot.  Hemostats were applied to the sutures in both groin.    After waiting a few minutes, I transected the sutures in both groins.  The skin incisions in both groin were repaired with U-stitches of 4-0 Monocryl.  The skin was cleaned, dried, and Dermabond used to reinforce the skin closures.  A sterile dressing was applied to both groins.  Completion Distal Pulse Distally, I could doppler on the right: anterior tibial artery and posterior tibial artery, and on the left: dorsalis pedis artery.   Leonides Sake, MD, FACS [facility] Office: [phone] Pager: [phone]  11/24/2016, 6:02 PM

## 2016-11-24 NOTE — Progress Notes (Signed)
Patient asking about her phone and belongings. No belongings were brought up to the room with her.   RN called son, Sam. Phone number is on file. Sam did not pick up, voicemail was left.   Have not received a call back.   Patient aware that RN called son and asked for belongings.   Peri Kreft E Mylinda Latina, [place]

## 2016-11-25 ENCOUNTER — Encounter (HOSPITAL_COMMUNITY): Payer: Self-pay | Admitting: Vascular Surgery

## 2016-11-25 DIAGNOSIS — I714 Abdominal aortic aneurysm, without rupture: Secondary | ICD-10-CM

## 2016-11-25 DIAGNOSIS — I5031 Acute diastolic (congestive) heart failure: Secondary | ICD-10-CM

## 2016-11-25 DIAGNOSIS — I483 Typical atrial flutter: Secondary | ICD-10-CM

## 2016-11-25 LAB — POCT I-STAT 7, (LYTES, BLD GAS, ICA,H+H)
Acid-Base Excess: 4 mmol/L — ABNORMAL HIGH (ref 0.0–2.0)
BICARBONATE: 29.5 mmol/L — AB (ref 20.0–28.0)
Calcium, Ion: 1.06 mmol/L — ABNORMAL LOW (ref 1.15–1.40)
HEMATOCRIT: 21 % — AB (ref 36.0–46.0)
Hemoglobin: 7.1 g/dL — ABNORMAL LOW (ref 12.0–15.0)
O2 SAT: 97 %
PCO2 ART: 46.3 mmHg (ref 32.0–48.0)
POTASSIUM: 2.9 mmol/L — AB (ref 3.5–5.1)
Patient temperature: 35.6
Sodium: 133 mmol/L — ABNORMAL LOW (ref 135–145)
TCO2: 31 mmol/L (ref 22–32)
pH, Arterial: 7.407 (ref 7.350–7.450)
pO2, Arterial: 91 mmHg (ref 83.0–108.0)

## 2016-11-25 LAB — GLUCOSE, CAPILLARY
GLUCOSE-CAPILLARY: 167 mg/dL — AB (ref 65–99)
GLUCOSE-CAPILLARY: 254 mg/dL — AB (ref 65–99)
GLUCOSE-CAPILLARY: 67 mg/dL (ref 65–99)
GLUCOSE-CAPILLARY: 78 mg/dL (ref 65–99)
GLUCOSE-CAPILLARY: 97 mg/dL (ref 65–99)
Glucose-Capillary: 83 mg/dL (ref 65–99)

## 2016-11-25 LAB — BASIC METABOLIC PANEL
Anion gap: 5 (ref 5–15)
BUN: 39 mg/dL — AB (ref 6–20)
CHLORIDE: 98 mmol/L — AB (ref 101–111)
CO2: 29 mmol/L (ref 22–32)
CREATININE: 1.97 mg/dL — AB (ref 0.44–1.00)
Calcium: 7.5 mg/dL — ABNORMAL LOW (ref 8.9–10.3)
GFR calc non Af Amer: 23 mL/min — ABNORMAL LOW (ref >60)
GFR, EST AFRICAN AMERICAN: 27 mL/min — AB (ref >60)
Glucose, Bld: 79 mg/dL (ref 65–99)
Potassium: 2.8 mmol/L — ABNORMAL LOW (ref 3.5–5.1)
Sodium: 132 mmol/L — ABNORMAL LOW (ref 135–145)

## 2016-11-25 LAB — CBC
HEMATOCRIT: 26 % — AB (ref 36.0–46.0)
HEMOGLOBIN: 8.2 g/dL — AB (ref 12.0–15.0)
MCH: 27.3 pg (ref 26.0–34.0)
MCHC: 31.5 g/dL (ref 30.0–36.0)
MCV: 86.7 fL (ref 78.0–100.0)
Platelets: 201 10*3/uL (ref 150–400)
RBC: 3 MIL/uL — ABNORMAL LOW (ref 3.87–5.11)
RDW: 17.9 % — AB (ref 11.5–15.5)
WBC: 9.7 10*3/uL (ref 4.0–10.5)

## 2016-11-25 LAB — BRAIN NATRIURETIC PEPTIDE: B NATRIURETIC PEPTIDE 5: 348 pg/mL — AB (ref 0.0–100.0)

## 2016-11-25 MED ORDER — FUROSEMIDE 80 MG PO TABS
120.0000 mg | ORAL_TABLET | Freq: Two times a day (BID) | ORAL | Status: DC
  Administered 2016-11-25 – 2016-11-26 (×4): 120 mg via ORAL
  Filled 2016-11-25 (×5): qty 1

## 2016-11-25 MED ORDER — WARFARIN SODIUM 5 MG PO TABS
5.0000 mg | ORAL_TABLET | Freq: Once | ORAL | Status: AC
  Administered 2016-11-25: 5 mg via ORAL
  Filled 2016-11-25: qty 1

## 2016-11-25 MED ORDER — POTASSIUM CHLORIDE CRYS ER 20 MEQ PO TBCR
40.0000 meq | EXTENDED_RELEASE_TABLET | Freq: Three times a day (TID) | ORAL | Status: AC
  Administered 2016-11-25 (×3): 40 meq via ORAL
  Filled 2016-11-25 (×2): qty 2

## 2016-11-25 MED ORDER — POTASSIUM CHLORIDE CRYS ER 20 MEQ PO TBCR
40.0000 meq | EXTENDED_RELEASE_TABLET | Freq: Two times a day (BID) | ORAL | Status: DC
  Filled 2016-11-25: qty 2

## 2016-11-25 MED ORDER — CIPROFLOXACIN IN D5W 400 MG/200ML IV SOLN
400.0000 mg | INTRAVENOUS | Status: AC
  Administered 2016-11-26: 400 mg via INTRAVENOUS
  Filled 2016-11-25: qty 200

## 2016-11-25 MED ORDER — WARFARIN - PHARMACIST DOSING INPATIENT
Freq: Every day | Status: DC
  Administered 2016-11-25 – 2016-11-27 (×2)

## 2016-11-25 NOTE — Progress Notes (Signed)
Hypoglycemic Event  CBG: 67 Treatment: 15 GM carbohydrate snack  Symptoms: None  Follow-up CBG: Time:0015 CBG Result:83  Possible Reasons for Event: Inadequate meal intake  Comments/MD notified: Patient asymptomatic. Will continue to monitor CBG    Sue Roach

## 2016-11-25 NOTE — Consult Note (Signed)
Cardiology Consultation:   Patient ID: Sue Roach; [id number]; 10-04-1940   Admit date: 11/24/2016 Date of Consult: 11/25/2016  Primary Care Provider: Kaleen Mask, MD Primary Cardiologist: Dr. Herbie Baltimore    Patient Profile:   Sue Roach is a 76 y.o. female with a hx of chronic diastolic HF, COPD, Pulmonary HTN, DM, essential HTN, CKD IV and recent long admission who is being seen today for the evaluation of CHF at the request of Dr. Imogene Burn.  Admitted 9/14-9/26 for CHF and new onset atrial flutter. 2D Echo with normal LVEF however right sided HF w/ severely dilated RA, moderately dilated RV and moderately reduced systolic function. PA pressure 58 mm Hg. Diuretics adjusted by nephrologist and recommended  BID discharge (she was treated with IV lasix  TID). Treated with coumadin for anticoagulation however discharge medication does not listed.  Also seen by CCM for COPD requiring BiPAP.  Patient refused multiple lab drawn and medication adjustments.   History of Present Illness:   Ms. Lasker wen to [facility] for 2 day of worsening back pain and vomiting Yesterday. Work up revealed 7.7 cm aneurysmseen on lumbar sacral films. Patient was seen by Dr. Imogene Burn and underwent emergent EVAR.  PROCEDURE: 1. Left common femoral artery cannulation under ultrasound guidance 2. "Preclose" repair of left common femoral artery, i.e. proglide placement x 2 3. Endovascular aortic repair with main body with attached limb (Gore C3 28 mm x 14 mm x 12 cm) 4. Placement of ipsilateral iliac limb extension (Gore 12 mm x 10 cm)  (To be dictated by Dr. Edilia Bo) 5. Left common femoral artery cannulation under ultrasound guidance 6. "Preclose" repair of left common femoral artery, i.e. proglide placement x 2 7. Placement contralateral iliac limb (Gore 16 mm x 11.5 cm)  Patient is placed on IV hydration due to dye usage yesterday. She in on high dose of diuretics for her CHF.  Cardiology is asked for further management. Scr 1.95-->1.97  (it was 1.56 on discharge 9/26). Potassium 3.1-->2.8 (was 5.3 on 9/26). Discharge weight was 245lb. Recorded weight yesterday was 240 & 254lb. Breathing stable. The patient states that one of her new medication at discharge made her sick during follow up with PCP 11/21/16.   Past Medical History:  Diagnosis Date  . CHF (congestive heart failure) (HCC)   . Diabetes mellitus without complication (HCC)   . Hypertension   . Kidney disease   . Renal disorder   . Shingles     History reviewed. No pertinent surgical history.   Inpatient Medications: Scheduled Meds: . albuterol  3 mL Inhalation Daily  . aspirin EC  81 mg Oral Daily  . carvedilol  3.125 mg Oral BID  . furosemide  120 mg Oral BID  . heparin  5,000 Units Subcutaneous Q8H  . hydrALAZINE  25 mg Oral TID  . insulin aspart  0-15 Units Subcutaneous TID WC  . isosorbide mononitrate  30 mg Oral Daily  . pantoprazole  40 mg Oral Daily  . pioglitazone  45 mg Oral Daily  . potassium chloride  40 mEq Oral TID   Continuous Infusions: . sodium chloride    . sodium chloride    . sodium chloride 75 mL/hr at 11/25/16 0800  . cefUROXime (ZINACEF)  IV Stopped (11/24/16 2246)  . [START ON 11/26/2016] ciprofloxacin    . magnesium sulfate 1 - 4 g bolus IVPB     PRN Meds: sodium chloride, acetaminophen **OR** acetaminophen, benzonatate, bisacodyl, docusate sodium, guaiFENesin-dextromethorphan, hydrALAZINE, magnesium sulfate 1 -  4 g bolus IVPB, morphine injection, ondansetron, oxyCODONE-acetaminophen, phenol, polyethylene glycol  Allergies:    Allergies  Allergen Reactions  . Sulfa Antibiotics Other (See Comments)    unknown  . Coreg [Carvedilol] Rash    Hair loss  . Zinc Rash    Social History:   Social History   Social History  . Marital status: Widowed    Spouse name: N/A  . Number of children: N/A  . Years of education: N/A   Occupational History  . Not on file.     Social History Main Topics  . Smoking status: Current Every Day Smoker    Packs/day: 1.00    Types: Cigarettes  . Smokeless tobacco: Never Used  . Alcohol use No  . Drug use: No  . Sexual activity: Not on file   Other Topics Concern  . Not on file   Social History Narrative  . No narrative on file    Family History:    Family History  Problem Relation Age of Onset  . Hypertension Mother   . Hypertension Maternal Grandfather      ROS:  Please see the history of present illness.  ROS All other ROS reviewed and negative.     Physical Exam/Data:   Vitals:   11/25/16 0500 11/25/16 0600 11/25/16 0700 11/25/16 0800  BP: (!) 84/36 (!) 122/50 (!) 120/49 98/69  Pulse: 62 62 63 66  Resp: Temp:      TempSrc:      SpO2: 99% 98% 99% 99%  Weight:      Height:        Intake/Output Summary (Last 24 hours) at 11/25/16 0946 Last data filed at 11/25/16 0800  Gross per 24 hour  Intake             2395 ml  Output             1075 ml  Net             1320 ml   Filed Weights   11/24/16 0802 11/24/16 2100  Weight: 240 lb (108.9 kg) 254 lb 6.6 oz (115.4 kg)   Body mass index is 49.69 kg/m.  General:  Well nourished, well developed, in no acute distress HEENT: normal.  Lymph: no adenopathy Neck: no JVD Endocrine:  No thryomegaly Vascular: No carotid bruits; FA pulses 2+ bilaterally without bruits  Cardiac:  normal S1, S2; RRR; no murmur Lungs:  clear to auscultation bilaterally, no wheezing, rhonchi or rales  Abd: soft, nontender, no hepatomegaly  Ext: no edema with puffy skin Musculoskeletal:  No deformities, BUE and BLE strength normal and equal Skin: warm and dry  Neuro:  CNs 2-12 intact, no focal abnormalities noted Psych:  Normal affect   EKG:  The EKG was personally reviewed and demonstrates:  Atrial flutter at rate of 95 bpm  Telemetry:  Telemetry was personally reviewed and demonstrates:  Atrial flutter at rate of 60s  Relevant CV Studies: Echo  11/07/16 Study Conclusions  - Left ventricle: The cavity size was mildly dilated. Wall   thickness was normal. Systolic function was normal. The estimated   ejection fraction was in the range of 55% to 60%. Wall motion was   normal; there were no regional wall motion abnormalities. - Ventricular septum: The contour showed diastolic flattening and   systolic flattening. These changes are consistent with RV volume   and pressure overload. - Mitral valve: There was mild regurgitation. Valve area by  continuity equation (using LVOT flow): 2.43 cm^2. - Left atrium: The atrium was severely dilated. - Right ventricle: The cavity size was moderately dilated. Systolic   function was moderately reduced. - Right atrium: The atrium was severely dilated. - Tricuspid valve: There was malcoaptation of the valve leaflets.   There was moderate-severe regurgitation directed centrally.   Diastolic regurgitation was present. - Pulmonary arteries: Systolic pressure was moderately increased.   PA peak pressure: 58 mm Hg (S).  Laboratory Data:  Chemistry Recent Labs Lab 11/24/16 0802 11/24/16 1653 11/24/16 1830 11/25/16 0509  NA 132* 133* 133* 132*  K 3.2* 2.9* 3.1* 2.8*  CL 92*  --  95* 98*  CO2 28  --  27 29  GLUCOSE 196*  --  117* 79  BUN 55*  --  43* 39*  CREATININE 2.07*  --  1.95* 1.97*  CALCIUM 8.3*  --  7.9* 7.5*  GFRNONAA 22*  --  24* 23*  GFRAA 25*  --  27* 27*  ANIONGAP 12  --  11 5     Recent Labs Lab 11/24/16 0802  PROT 7.2  ALBUMIN 2.7*  AST 15  ALT 16  ALKPHOS 89  BILITOT 0.7   Hematology Recent Labs Lab 11/24/16 0802 11/24/16 1653 11/24/16 1830 11/25/16 0509  WBC 11.4*  --  12.5* 9.7  RBC 3.46*  --  3.04* 3.00*  HGB 9.2* 7.1* 8.2* 8.2*  HCT 29.5* 21.0* 26.0* 26.0*  MCV 85.3  --  85.5 86.7  MCH 26.6  --  27.0 27.3  MCHC 31.2  --  31.5 31.5  RDW 18.4*  --  18.4* 17.9*  PLT 245  --  209 201    Radiology/Studies:  Dg Lumbar Spine Complete  Addendum  Date: 11/24/2016   ADDENDUM REPORT: 11/24/2016 09:14 ADDENDUM: Study discussed by telephone with Dr. Loren Racer on 11/24/2016 at 0902 hours. Electronically Signed   By: Odessa Fleming M.D.   On: 11/24/2016 09:14   Result Date: 11/24/2016 CLINICAL DATA:  76 year old female with lumbar back pain for 2 days with no known injury. EXAM: LUMBAR SPINE - COMPLETE 4+ VIEW COMPARISON:  Abdomen radiographs 11/07/2016, and earlier chest radiographs. FINDINGS: Large rim calcified infrarenal abdominal aortic aneurysm, up to 77 mm diameter judging from the lateral radiographic appearance. Aortoiliac calcified atherosclerosis. Partially lumbarized S1 level suspected with full size ribs at T12. Otherwise normal lumbar segmentation. Mild grade 1 anterolisthesis of L5 on S1 with advanced disc and posterior element degeneration. Mild disc space loss and endplate spurring elsewhere. Degenerative appearing sclerotic inferior bilateral SI joints. No acute osseous abnormality identified. IMPRESSION: 1. Very large infrarenal abdominal aortic aneurysm. Recommend follow-up CT or CTA. Aortic Atherosclerosis (ICD10-I70.0). 2.  No acute osseous abnormality identified in the lumbar spine. Electronically Signed: By: Odessa Fleming M.D. On: 11/24/2016 08:58   Dg Chest Port 1 View  Result Date: 11/24/2016 CLINICAL DATA:  Status post AAA repair using bifurcation graft, central line placement EXAM: PORTABLE CHEST 1 VIEW COMPARISON:  11/15/2016 FINDINGS: Left-sided central venous catheter tip overlies the venous confluence. No pneumothorax is seen. Cardiomegaly with central vascular congestion. Mild diffuse interstitial opacities suspicious for mild edema. No large effusion. Aortic atherosclerosis. IMPRESSION: 1. Left-sided central venous catheter tip overlies the venous confluence. Negative for pneumothorax 2. Cardiomegaly with central congestion and mild pulmonary edema. Electronically Signed   By: Jasmine Pang M.D.   On: 11/24/2016 20:03   Ct Angio  Chest/abd/pel For Dissection W And/or W/wo  Result Date: 11/24/2016 CLINICAL  DATA:  76 year old female with a history of back pain. Prior plain film radiograph demonstrates abdominal aortic aneurysm. EXAM: CT ANGIOGRAPHY CHEST, ABDOMEN AND PELVIS TECHNIQUE: Multidetector CT imaging through the chest, abdomen and pelvis was performed using the standard protocol during bolus administration of intravenous contrast. Multiplanar reconstructed images and MIPs were obtained and reviewed to evaluate the vascular anatomy. CONTRAST:  100 cc Isovue 370 COMPARISON:  None. FINDINGS: CTA CHEST FINDINGS Cardiovascular: Heart: Cardiomegaly. No pericardial fluid/ thickening. Minimal calcifications of the right coronary artery. Calcifications of the aortic valve. Aorta: Ascending aorta measures 3.3 cm with no dissection. Mild calcifications of the aortic arch. Branch vessels are patent with calcifications of the innominate artery, left common carotid artery origin, left subclavian artery origin. Branch vessels patent. Proximal left and right common carotid arteries patent. Proximal left and right vertebral arteries patent. There is symmetric contrast within the left and right subclavian artery with no occlusion identified. Irregular soft plaque throughout the length of the descending thoracic aorta with no dissection identified. No aneurysm of the thoracic aorta. No periaortic fluid at the descending thoracic aorta. Pulmonary arteries: Main pulmonary artery measures 4.2 cm. The pulmonary arteries are enlarged at the hour 1/3 of lung, greater than the accompanying bronchi. No filling defects of the main pulmonary artery, lobar, segmental or proximal subsegmental pulmonary arteries. Mediastinum/Nodes: Fluid within the pericardial recesses. There are several mediastinal lymph nodes with calcifications. No enlarged mediastinal lymph nodes. Unremarkable thyroid and thoracic inlet. Lungs/Pleura: Respiratory motion somewhat limits  evaluation of the lungs. Atelectasis/scarring at the lung bases. No pleural effusion. No evidence of lobar pneumonia. No pneumothorax. Musculoskeletal: No displaced fracture. Degenerative changes of the spine. Review of the MIP images confirms the above findings. CTA ABDOMEN AND PELVIS FINDINGS VASCULAR Aorta: Infrarenal abdominal aortic aneurysm with the greatest diameter measuring approximately 6.1 cm. Irregular calcified and soft plaque along the length of the abdominal aorta. No significant circumferential thrombus at the aneurysm sac. There is circumferential edema/inflammatory change at the aneurysm sac with no periaortic fluid. No evidence of dissection or intramural hematoma. Celiac: Atherosclerotic changes at the origin of the celiac artery which remains patent. SMA: Atherosclerotic changes at the origin of the superior mesenteric artery which remains patent. Renals: Single renal arteries bilaterally with mild atherosclerotic changes at the origin. There may be 50% stenosis on the left. IMA: Inferior mesenteric arteries occluded at the origin. Reconstitution of left colic artery and superior rectal artery a via collateral flow. Right lower extremity: Patent right common iliac artery with calcified and soft plaque. Hypogastric artery is patent as well as the branch vessels into the pelvis. External iliac artery is patent. Common femoral artery patent with mild atherosclerotic changes. Proximal SFA and profunda femoris patent. Left lower extremity: Patent left common iliac artery with calcified and soft plaque. Hypogastric artery is patent. External iliac artery patent. Mild atherosclerotic changes of the common femoral artery. Profunda femoris and the proximal superficial femoral artery patent. Veins: Unremarkable appearance of the venous system. Review of the MIP images confirms the above findings. NON-VASCULAR Hepatobiliary: Cranial caudal span of the right liver margin measures 19.5 cm. Diffusely  low-density liver parenchyma. Unremarkable appearance of the gallbladder with no inflammatory changes. Pancreas: Unremarkable pancreas Spleen: Punctate calcifications within the spleen compatible with prior granulomatous disease. Adrenals/Urinary Tract: Unremarkable appearance of adrenal glands. Right: Renal cortical thinning. Low-density rounded cyst on the posterior cortex of the right kidney compatible of Bosniak 1 cyst. No hydronephrosis or nephrolithiasis. Unremarkable course of the right ureter. Left:  Symmetric perfusion of the left kidney to the right. Renal cortical thinning. No hydronephrosis or nephrolithiasis. Unremarkable course of the left ureter. Unremarkable appearance of the urinary bladder . Stomach/Bowel: Unremarkable appearance of the stomach. Unremarkable appearance of small bowel. No evidence of obstruction. Normal appendix. Colonic diverticula. No associated inflammatory changes. Lymphatic: No lymphadenopathy Mesenteric: No lymph nodes within the mesenteric. No free fluid or free air. Reproductive: Unremarkable uterus and adnexae Other: Small fat containing umbilical hernia Musculoskeletal: No acute fracture. Degenerative changes throughout the visualized thoracolumbar spine. No bony canal narrowing. Facet changes bilaterally of the lower lumbar spine. Vacuum disc phenomenon throughout the lumbar spine. IMPRESSION: Infrarenal abdominal aortic aneurysm measuring 6.1 cm. There are mild inflammatory changes/ edema surrounding the aneurysm sac. Although nonspecific, this could represent mycotic aneurysm or other nonspecific inflammatory changes. Extensive irregular plaque of the descending thoracic aorta and the suprarenal abdominal aorta with no dissection identified. These results were called by telephone at the time of interpretation on 11/24/2016 at 11:39 am to Dr. Lorre Nick , who verbally acknowledged these results. Mild bilateral iliac and common femoral artery disease. 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 Bilateral renal artery atherosclerosis, with possible stenosis on the left. Cardiomegaly with single vessel coronary artery disease. Evidence of pulmonary hypertension. Referral for pulmonary evaluation may be useful. Calcified mediastinal lymph nodes and spleen calcifications compatible with prior granulomatous disease. Hepatomegaly and liver steatosis. Diverticular disease without evidence of acute diverticulitis. Signed, Yvone Neu. Loreta Ave, DO [facility] Radiology Electronically Signed   By: Gilmer Mor D.O.   On: 11/24/2016 11:44    Assessment and Plan:   1. Chronic diastolic and R sided heart failure - 2D 9/17 Echo with normal LVEF however right sided HF w/ severely dilated RA, moderately dilated RV and moderately reduced systolic function. PA pressure 58 mm Hg.  - She did not received her diuretics yesterday and now on IV fluids for hydration due contrast load yesterday.  - Currently volume status stable. Watch for over hydration closely. Continue home dose of lasix  BID--> up titrate as needed. Diuretics was managed by nephrologist during last admission. Pending BNP.   2. Hypokalemia - Potassium 3.1-->2.8 (was 5.3 on 9/26). Give Kdur x 3 today. Reassess tomorrow. Will need low dose at discharge.   3. Persistent atrial flutter - At controlled variable rate. Continue Coreg. CHADSVASC of at least 6. She was treated with coumadin during last admission however discharge medication does not list it. Plan was to check INR by PCP. Start coumadin when ok with surgery per  pharmacy consult. Will make follow up in out clinic in few weeks.   4. HTN - BP relatively stable on current medications.   For questions or updates, please contact [facility] Please consult [email or web address] for contact info under Cardiology/STEMI.   Lorelei Pont, PA  11/25/2016 9:46 AM   Patient examined chart  reviewed. Exam with obese white female sitting in chair. Nausea. Lungs clear Large echymosis  In right lower quadrant pannus. Plus one bilateral edema distant heart sounds foley in place. She has chronic flutter with good rate control. On heparin. Hct 26 Would continue bid lasix for diastolic dysfunction and resume coumadin per pharmacy protocol when ok with vascular surgery. Supplement K   Charlton Haws

## 2016-11-25 NOTE — Op Note (Signed)
    NAME: Sue Roach    MRN: [id number] DOB: [birth year]    DATE OF OPERATION: 11/25/2016  PREOP DIAGNOSIS:    Symptomatic abdominal aortic aneurysm  POSTOP DIAGNOSIS:    Same  PROCEDURE:    Ultrasound guided cannulation of the left common femoral artery Pre-close repair of left common femoral artery Placement of contralateral limb (16 mm x 11.5 cm Gore device)  CO-SURGEON's: Di Kindle. Edilia Bo, MD, Leonides Sake, MD  ANESTHESIA: Gen.   EBL: 100 cc  INDICATIONS:    Sue Roach is a 76 y.o. female who presented with a symptomatic abdominal aortic aneurysm. She presents for emergent repair.  FINDINGS:    Completion arteriogram shows the graft and excellent position with no evidence of type I endoleak.  TECHNIQUE:    On the right side, under ultrasound guidance, the right common femoral artery was cannulated with a micropuncture needle and a micropuncture sheath introduced over a wire. A Benson wire was passed through the micropuncture sheath and an 8 [place] dilator was used to dilate the tract. The initial Perclose device was rotated 15 medially. The second Perclose device was rotated 15 laterally. Next the 8 French sheath was passed over the wire. I then exchanged the [facility] wire for an Amplatz wire using a Berenstein catheter. A 12 French dry seal sheath was placed over the Amplatz wire on the right side.  After the trunk ipsilateral component had been deployed we used a buddy wire system on the right and placed a Glidewire adjacent to the Amplatz wire leaving the Amplatz wire in place for support. The contralateral gate was cannulated using an angled Glidewire and a Berenstein 2 catheter. Intraluminal luminal position was confirmed by turning a Omni Flush catheter within the graft. The Amplatz wire was then positioned through the contralateral limb. The contralateral limb was a 16 mm x 11.5 cm device which was positioned into the contralateral gate and above  the level of the hypogastric artery which had been identified with a retrograde arteriogram. The contralateral limb was deployed without difficulty.  At the completion, the Perclose devices were secured one of the devices failed likely because of calcium within the arterial wall and a third device was placed. These were then secured with good hemostasis and the wire was removed. The catheter was then secured again with good hemostasis. The incision in the right groin was closed with a 4-0 Monocryl.  The patient tolerated the procedure well and was transferred to the recovery room in stable condition. All needle and sponge counts were correct.  Waverly Ferrari, MD, FACS [facility]  DATE OF DICTATION:   11/25/2016

## 2016-11-25 NOTE — Progress Notes (Signed)
[facility] Brief Note  Dr. Signe Colt was called from the OR last night to "be aware" of pt with CKD3 baseline creatinine 1.9 who underwent CT angio, then EVAR (10/4);  aortogram this AM.  Formal consult was not requested.  Creatinine is stable at 1.9, making urine (just received 120 mg lasix this AM) and K appropriately replaced by Dr. Imogene Burn.  No renal intervention appears needed at this time. Please call if formal renal evaluation is needed.  Camille Bal, MD [facility] [phone] Pager 11/25/2016, 12:05 PM

## 2016-11-25 NOTE — Evaluation (Signed)
Physical Therapy Evaluation Patient Details Name: Sue Roach MRN: [id number] DOB: 09/26/1940 Today's Date: 11/25/2016   History of Present Illness  76 y.o. female chief complaint: back pain since Tuesday.  The patient notes baseline chronic back pain, but notes this new pain is unlike her prior pain.  The pain is vague in character, persistent moderate to severe in intensity.  Patient thinks the pain has increased since Tuesday.  The patient's risk factors for AAA included: active smoking.  The patient was recently admitted with acute on chronic CHF. s/p EVAR 11/24/16 AAA PMH DM, HTN, CKD, and R sided CHF    Clinical Impression  Patient is s/p above surgery resulting in functional limitations due to the deficits listed below (see PT Problem List). Pt limited by generalized weakness and dizziness with positional change. Pt currently mod A for bed mobility, and minA for transfers.  Patient will benefit from skilled PT to increase their independence and safety with mobility to allow discharge to the venue listed below.       Follow Up Recommendations SNF    Equipment Recommendations  None recommended by PT    Recommendations for Other Services OT consult     Precautions / Restrictions Restrictions Weight Bearing Restrictions: No      Mobility  Bed Mobility Overal bed mobility: Needs Assistance Bed Mobility: Supine to Sit     Supine to sit: Mod assist;HOB elevated     General bed mobility comments: modA for LE management off bed and trunk to upright  Transfers Overall transfer level: Needs assistance Equipment used: None;1 person hand held assist Transfers: Sit to/from Stand Sit to Stand: Min assist         General transfer comment: minA for power up and steadying before reaching to RW for support  Ambulation/Gait             General Gait Details: pt dizziness and probably hypotension precluded ambulation at this time     Balance Overall balance  assessment: Needs assistance Sitting-balance support: Bilateral upper extremity supported;Feet supported Sitting balance-Leahy Scale: Poor Sitting balance - Comments: required either bilateral support or PT support to maintain seated balance on coming to upright eventually able to progress to single UE support   Standing balance support: Bilateral upper extremity supported Standing balance-Leahy Scale: Poor Standing balance comment: requires UE support and minA to maintain static standing balance                             Pertinent Vitals/Pain Pain Assessment: 0-10 Pain Score: 5  Pain Location:  (back (chronic)) Pain Descriptors / Indicators: Discomfort;Aching Pain Intervention(s): Monitored during session;Limited activity within patient's tolerance;Premedicated before session;Repositioned    Home Living Family/patient expects to be discharged to:: Private residence Living Arrangements: Alone Available Help at Discharge: Family;Available PRN/intermittently Type of Home: House Home Access: Level entry     Home Layout: Two level;Bed/bath upstairs;Other (Comment);1/2 bath on main level (sleeps in recliner downstairs, ) Home Equipment: Walker - 2 wheels;Cane - single point;Bedside commode;Tub bench Additional Comments: difficult to follow availability of help    Prior Function Level of Independence: Needs assistance   Gait / Transfers Assistance Needed: about to start HHPT 3x/wk, ambulates limited household distances with RW   ADL's / Homemaking Assistance Needed: bathing aide 2x week,   Comments: working on nursing aide 2 x week     Hand Dominance        Extremity/Trunk Assessment  Upper Extremity Assessment Upper Extremity Assessment: Generalized weakness    Lower Extremity Assessment Lower Extremity Assessment: Generalized weakness       Communication   Communication: No difficulties  Cognition Arousal/Alertness: Awake/alert Behavior During  Therapy: WFL for tasks assessed/performed Overall Cognitive Status: Within Functional Limits for tasks assessed                                        General Comments General comments (skin integrity, edema, etc.): Pt on 4L O2 via nasal cannula, SaO2 >95%O2 throughout session, BP in supine 98/69, seated EoB 92/69 w/ c/o dizziness, after 3 minutes BP 108/42 diminished dizziness, with transfer to chair 124/11 no dizziness , HR at rest 66bpm, max with movement 80bpm    Exercises     Assessment/Plan    PT Assessment Patient needs continued PT services  PT Problem List Decreased strength;Decreased activity tolerance;Decreased balance;Decreased mobility;Decreased safety awareness;Cardiopulmonary status limiting activity;Pain       PT Treatment Interventions DME instruction;Gait training;Functional mobility training;Stair training;Therapeutic activities;Balance training;Therapeutic exercise;Patient/family education    PT Goals (Current goals can be found in the Care Plan section)  Acute Rehab PT Goals Patient Stated Goal: go home PT Goal Formulation: With patient Time For Goal Achievement: 12/09/16 Potential to Achieve Goals: Fair    Frequency Min 3X/week   Barriers to discharge Decreased caregiver support alone with no good plans for assistance       AM-PAC PT "6 Clicks" Daily Activity  Outcome Measure Difficulty turning over in bed (including adjusting bedclothes, sheets and blankets)?: Unable Difficulty moving from lying on back to sitting on the side of the bed? : Unable Difficulty sitting down on and standing up from a chair with arms (e.g., wheelchair, bedside commode, etc,.)?: Unable Help needed moving to and from a bed to chair (including a wheelchair)?: A Lot Help needed walking in hospital room?: A Lot Help needed climbing 3-5 steps with a railing? : Total 6 Click Score: 8    End of Session Equipment Utilized During Treatment: Gait belt Activity  Tolerance: Patient limited by fatigue Patient left: in chair;with call bell/phone within reach Nurse Communication: Mobility status PT Visit Diagnosis: Dizziness and giddiness (R42);Unsteadiness on feet (R26.81);Other abnormalities of gait and mobility (R26.89);Muscle weakness (generalized) (M62.81)    Time: 1610-9604 PT Time Calculation (min) (ACUTE ONLY): 45 min   Charges:   PT Evaluation $PT Eval Moderate Complexity: 1 Mod PT Treatments $Therapeutic Activity: 23-37 mins   PT G Codes:        Mahreen Schewe B. Beverely Risen PT, DPT Acute Rehabilitation  [phone] Pager [phone]    Elon Alas Fleet 11/25/2016, 9:36 AM

## 2016-11-25 NOTE — Progress Notes (Signed)
ANTICOAGULATION CONSULT NOTE - Initial Consult  Pharmacy Consult for Warfarin Indication: a-flutter  Patient Measurements: Height: 5' (152.4 cm) Weight: 254 lb 6.6 oz (115.4 kg) IBW/kg (Calculated) : 45.5  Vital Signs: Temp: 98.2 F (36.8 C) (10/05 0300) Temp Source: Oral (10/05 0300) BP: 124/111 (10/05 0959) Pulse Rate: 63 (10/05 0959)  Labs:  Recent Labs  11/24/16 0802 11/24/16 1653 11/24/16 1830 11/25/16 0509  HGB 9.2* 7.1* 8.2* 8.2*  HCT 29.5* 21.0* 26.0* 26.0*  PLT 245  --  209 201  APTT 65*  --  46*  --   LABPROT 15.3*  --  17.5*  --   INR 1.22  --  1.45  --   CREATININE 2.07*  --  1.95* 1.97*   Estimated Creatinine Clearance: 27.7 mL/min (A) (by C-G formula based on SCr of 1.97 mg/dL (H)).  Assessment: 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 She has a-flutter and a CHADSVASc of 6 so anticoagulation with warfarin will be started. Hgb 8.2, PLTs 201, INR 1.45.  Goal of Therapy:  INR 2-3 Monitor platelets by anticoagulation protocol: Yes   Plan:  Warfarin  x1 Daily INR, CBC, monitor for s/sx of bleeding  Nolen Mu PharmD PGY1 Pharmacy Practice Resident 11/25/2016 11:32 AM Pager: [phone]

## 2016-11-25 NOTE — Progress Notes (Signed)
Patient had an episode of vomiting this morning. Was relieved by Zofran. Ambulated 271ft. tolerated ambulation very well.

## 2016-11-25 NOTE — Progress Notes (Signed)
   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 with hydration  CHF: Lasix 120 mg PO bid, draw BNP, get cardio to look pt over for optimization  HypoK: Kdur 40 meq PO BID x 2  D/C A-line  D/C foley  PT: evaluate for any home needs, per family pt has some deconditioning  Home tomorrow once fluid status optomize and 24 hour trend of H/H stable  UTI: continue Cipro 400 mg IV q12 hr for total of 3 days, continue IV as pt still nauseated  Txt to avoid any risk of bacteremia due to UTI given recent endograft placement  DDD: 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, will need follow up as outpatient, unclear if this is the etiology of this patient's pain   Subjective  - 1 Day Post-Op   Nausea better, back pain better but still present   Objective   Vitals:   11/25/16 0300 11/25/16 0400 11/25/16 0500 11/25/16 0600  BP: (!) 103/37 (!) 79/34 (!) 84/36 (!) 122/50  Pulse: 67 63 62 62  Resp: Temp: 98.2 F (36.8 C)     TempSrc: Oral     SpO2: 97% 97% 99% 98%  Weight:      Height:         Intake/Output Summary (Last 24 hours) at 11/25/16 0825 Last data filed at 11/25/16 0600  Gross per 24 hour  Intake             2245 ml  Output             1075 ml  Net             1170 ml    PULM  B rales (improved from postop)  CV  RRR  GI  soft, NTND  VASC B groins: some echymosis present, inc x 2 c/d/i, both feet viable and pink  NEURO Sensation intact in feet    Laboratory   CBC CBC Latest Ref Rng  & Units 11/25/2016 11/24/2016 11/24/2016  WBC 4.0 - 10.5 K/uL 9.7 12.5(H) 11.4(H)  Hemoglobin 12.0 - 15.0 g/dL 8.2(L) 8.2(L) 9.2(L)  Hematocrit 36.0 - 46.0 % 26.0(L) 26.0(L) 29.5(L)  Platelets 150 - 400 K/uL 201 209 245    BMET    Component Value Date/Time   NA 132 (L) 11/25/2016 0509   NA 140 02/11/2016   K 2.8 (L) 11/25/2016 0509   CL 98 (L) 11/25/2016 0509   CO2 29 11/25/2016 0509   GLUCOSE 79 11/25/2016 0509   BUN 39 (H) 11/25/2016 0509   BUN 55 (A) 02/11/2016   CREATININE 1.97 (H) 11/25/2016 0509   CALCIUM 7.5 (L) 11/25/2016 0509   GFRNONAA 23 (L) 11/25/2016 0509   GFRAA 27 (L) 11/25/2016 0509     Leonides Sake, MD, FACS [facility] Office: [phone] Pager: [phone]  11/25/2016, 8:25 AM

## 2016-11-26 DIAGNOSIS — I5032 Chronic diastolic (congestive) heart failure: Secondary | ICD-10-CM

## 2016-11-26 LAB — TYPE AND SCREEN
ABO/RH(D): A POS
ANTIBODY SCREEN: NEGATIVE
UNIT DIVISION: 0
UNIT DIVISION: 0

## 2016-11-26 LAB — BPAM RBC
BLOOD PRODUCT EXPIRATION DATE: 201810172359
BLOOD PRODUCT EXPIRATION DATE: 201810172359
ISSUE DATE / TIME: 201810041802
ISSUE DATE / TIME: 201810050015
UNIT TYPE AND RH: 6200
Unit Type and Rh: 6200

## 2016-11-26 LAB — GLUCOSE, CAPILLARY
GLUCOSE-CAPILLARY: 89 mg/dL (ref 65–99)
Glucose-Capillary: 135 mg/dL — ABNORMAL HIGH (ref 65–99)

## 2016-11-26 MED ORDER — WARFARIN SODIUM 7.5 MG PO TABS
7.5000 mg | ORAL_TABLET | Freq: Once | ORAL | Status: DC
  Filled 2016-11-26: qty 1

## 2016-11-26 NOTE — Progress Notes (Signed)
Subjective: Interval History: none.. No complaints. Is done very little walking since surgery. Continued with good urine output.  Objective: Vital signs in last 24 hours: Temp:  [97.6 F (36.4 C)-98.7 F (37.1 C)] 97.7 F (36.5 C) (10/06 0415) Pulse Rate:  [57-90] 57 (10/06 0808) Resp:  [14-30] 19 (10/06 0808) BP: (91-137)/(35-77) 116/69 (10/06 0808) SpO2:  [89 %-98 %] 96 % (10/06 0808) Arterial Line BP: (135-185)/(56-103) 146/56 (10/05 2000) Weight:  [258 lb 1.6 oz (117.1 kg)] 258 lb 1.6 oz (117.1 kg) (10/06 0415)  Intake/Output from previous day: 10/05 0701 - 10/06 0700 In: 1025 [P.O.:50; I.V.:975] Out: 825 [Urine:825] Intake/Output this shift: Total I/O In: 50 [P.O.:50] Out: -   Groins without hematoma. Abdomen obese soft and benign  Lab Results:  Recent Labs  11/24/16 1830 11/25/16 0509  WBC 12.5* 9.7  HGB 8.2* 8.2*  HCT 26.0* 26.0*  PLT 209 201   BMET  Recent Labs  11/24/16 1830 11/25/16 0509  NA 133* 132*  K 3.1* 2.8*  CL 95* 98*  CO2 27 29  GLUCOSE 117* 79  BUN 43* 39*  CREATININE 1.95* 1.97*  CALCIUM 7.9* 7.5*    Studies/Results: Dg Chest 2 View  Result Date: 11/15/2016 CLINICAL DATA:  Cough, shortness of breath, and chest congestion. History of CHF. EXAM: CHEST  2 VIEW COMPARISON:  PA and lateral chest x-ray of November 13, 2016 FINDINGS: The lungs are well-expanded. There is right middle lobe atelectasis which is more conspicuous today. The cardiac silhouette is enlarged. The pulmonary vascularity is mildly prominent centrally. The bony thorax exhibits no acute abnormality. IMPRESSION: Progressive right middle lobe atelectasis. Follow-up radiographs following anticipated antibiotic therapy are recommended to assure clearing. The findings may be secondary to a central obstructing lesion and ultimately chest CT scanning may be needed. Mild CHF, stable. Thoracic aortic atherosclerosis. Electronically Signed   By: David  [place] M.D.   On: 11/15/2016  12:51   Dg Chest 2 View  Result Date: 11/13/2016 CLINICAL DATA:  Hypoxia EXAM: CHEST  2 VIEW COMPARISON:  11/05/2016 FINDINGS: Cardiac silhouette is mildly enlarged. There is vascular congestion and bilateral interstitial thickening similar to the prior exam. Additional linear lung base opacities noted consistent with atelectasis, also similar to the prior study. No evidence of pneumonia. There are stable calcified granuloma in the right mid lung associated with right hilar calcified nodes. No pleural effusion.  No pneumothorax. Skeletal structures are demineralized but grossly intact. IMPRESSION: 1. Mild congestive heart failure. Electronically Signed   By: Amie Portland M.D.   On: 11/13/2016 17:20   Dg Chest 2 View  Result Date: 11/05/2016 CLINICAL DATA:  CHF. EXAM: CHEST  2 VIEW COMPARISON:  Chest x-ray dated November 04, 2016. FINDINGS: Stable moderate cardiomegaly. Mild pulmonary vascular congestion. Mild basal predominant interstitial thickening, similar to prior study. Bibasilar atelectasis. No focal consolidation, pleural effusion, or pneumothorax. No acute osseous abnormality. IMPRESSION: Stable cardiomegaly with mild interstitial edema. Electronically Signed   By: Obie Dredge M.D.   On: 11/05/2016 08:42   Dg Chest 2 View  Result Date: 11/04/2016 CLINICAL DATA:  76 year old female with a history of decreased oxygenation EXAM: CHEST  2 VIEW COMPARISON:  02/04/2016, 01/28/2016 FINDINGS: Cardiomediastinal silhouette again enlarged with cardiomegaly. Calcifications of the aortic arch. No pneumothorax. Interlobular septal thickening with coarsened interstitial markings. No large pleural effusion. No confluent airspace disease. Degenerative changes of the spine.  No displaced fracture IMPRESSION: Evidence of Rameen Gohlke edema with no large pleural effusion. Cardiomegaly. Electronically Signed  By: Gilmer Mor D.O.   On: 11/04/2016 13:50   Dg Lumbar Spine Complete  Addendum Date: 11/24/2016    ADDENDUM REPORT: 11/24/2016 09:14 ADDENDUM: Study discussed by telephone with Dr. Loren Racer on 11/24/2016 at 0902 hours. Electronically Signed   By: Odessa Fleming M.D.   On: 11/24/2016 09:14   Result Date: 11/24/2016 CLINICAL DATA:  76 year old female with lumbar back pain for 2 days with no known injury. EXAM: LUMBAR SPINE - COMPLETE 4+ VIEW COMPARISON:  Abdomen radiographs 11/07/2016, and earlier chest radiographs. FINDINGS: Large rim calcified infrarenal abdominal aortic aneurysm, up to 77 mm diameter judging from the lateral radiographic appearance. Aortoiliac calcified atherosclerosis. Partially lumbarized S1 level suspected with full size ribs at T12. Otherwise normal lumbar segmentation. Mild grade 1 anterolisthesis of L5 on S1 with advanced disc and posterior element degeneration. Mild disc space loss and endplate spurring elsewhere. Degenerative appearing sclerotic inferior bilateral SI joints. No acute osseous abnormality identified. IMPRESSION: 1. Very large infrarenal abdominal aortic aneurysm. Recommend follow-up CT or CTA. Aortic Atherosclerosis (ICD10-I70.0). 2.  No acute osseous abnormality identified in the lumbar spine. Electronically Signed: By: Odessa Fleming M.D. On: 11/24/2016 08:58   Dg Abd 1 View  Result Date: 11/07/2016 CLINICAL DATA:  Abdominal distention. EXAM: ABDOMEN - 1 VIEW COMPARISON:  None. FINDINGS: The bowel gas pattern is normal. No radio-opaque calculi or other significant radiographic abnormality are seen. No visible free air or free fluid. No discrete bone abnormality. IMPRESSION: Benign-appearing abdomen. Electronically Signed   By: Francene Boyers M.D.   On: 11/07/2016 13:56   Dg Chest Port 1 View  Result Date: 11/24/2016 CLINICAL DATA:  Status post AAA repair using bifurcation graft, central line placement EXAM: PORTABLE CHEST 1 VIEW COMPARISON:  11/15/2016 FINDINGS: Left-sided central venous catheter tip overlies the venous confluence. No pneumothorax is seen.  Cardiomegaly with central vascular congestion. Mild diffuse interstitial opacities suspicious for mild edema. No large effusion. Aortic atherosclerosis. IMPRESSION: 1. Left-sided central venous catheter tip overlies the venous confluence. Negative for pneumothorax 2. Cardiomegaly with central congestion and mild pulmonary edema. Electronically Signed   By: Jasmine Pang M.D.   On: 11/24/2016 20:03   Ct Angio Chest/abd/pel For Dissection W And/or W/wo  Result Date: 11/24/2016 CLINICAL DATA:  76 year old female with a history of back pain. Prior plain film radiograph demonstrates abdominal aortic aneurysm. EXAM: CT ANGIOGRAPHY CHEST, ABDOMEN AND PELVIS TECHNIQUE: Multidetector CT imaging through the chest, abdomen and pelvis was performed using the standard protocol during bolus administration of intravenous contrast. Multiplanar reconstructed images and MIPs were obtained and reviewed to evaluate the vascular anatomy. CONTRAST:  100 cc Isovue 370 COMPARISON:  None. FINDINGS: CTA CHEST FINDINGS Cardiovascular: Heart: Cardiomegaly. No pericardial fluid/ thickening. Minimal calcifications of the right coronary artery. Calcifications of the aortic valve. Aorta: Ascending aorta measures 3.3 cm with no dissection. Mild calcifications of the aortic arch. Branch vessels are patent with calcifications of the innominate artery, left common carotid artery origin, left subclavian artery origin. Branch vessels patent. Proximal left and right common carotid arteries patent. Proximal left and right vertebral arteries patent. There is symmetric contrast within the left and right subclavian artery with no occlusion identified. Irregular soft plaque throughout the length of the descending thoracic aorta with no dissection identified. No aneurysm of the thoracic aorta. No periaortic fluid at the descending thoracic aorta. Pulmonary arteries: Main pulmonary artery measures 4.2 cm. The pulmonary arteries are enlarged at the hour 1/3  of lung, greater than the accompanying bronchi.  No filling defects of the main pulmonary artery, lobar, segmental or proximal subsegmental pulmonary arteries. Mediastinum/Nodes: Fluid within the pericardial recesses. There are several mediastinal lymph nodes with calcifications. No enlarged mediastinal lymph nodes. Unremarkable thyroid and thoracic inlet. Lungs/Pleura: Respiratory motion somewhat limits evaluation of the lungs. Atelectasis/scarring at the lung bases. No pleural effusion. No evidence of lobar pneumonia. No pneumothorax. Musculoskeletal: No displaced fracture. Degenerative changes of the spine. Review of the MIP images confirms the above findings. CTA ABDOMEN AND PELVIS FINDINGS VASCULAR Aorta: Infrarenal abdominal aortic aneurysm with the greatest diameter measuring approximately 6.1 cm. Irregular calcified and soft plaque along the length of the abdominal aorta. No significant circumferential thrombus at the aneurysm sac. There is circumferential edema/inflammatory change at the aneurysm sac with no periaortic fluid. No evidence of dissection or intramural hematoma. Celiac: Atherosclerotic changes at the origin of the celiac artery which remains patent. SMA: Atherosclerotic changes at the origin of the superior mesenteric artery which remains patent. Renals: Single renal arteries bilaterally with mild atherosclerotic changes at the origin. There may be 50% stenosis on the left. IMA: Inferior mesenteric arteries occluded at the origin. Reconstitution of left colic artery and superior rectal artery a via collateral flow. Right lower extremity: Patent right common iliac artery with calcified and soft plaque. Hypogastric artery is patent as well as the branch vessels into the pelvis. External iliac artery is patent. Common femoral artery patent with mild atherosclerotic changes. Proximal SFA and profunda femoris patent. Left lower extremity: Patent left common iliac artery with calcified and soft  plaque. Hypogastric artery is patent. External iliac artery patent. Mild atherosclerotic changes of the common femoral artery. Profunda femoris and the proximal superficial femoral artery patent. Veins: Unremarkable appearance of the venous system. Review of the MIP images confirms the above findings. NON-VASCULAR Hepatobiliary: Cranial caudal span of the right liver margin measures 19.5 cm. Diffusely low-density liver parenchyma. Unremarkable appearance of the gallbladder with no inflammatory changes. Pancreas: Unremarkable pancreas Spleen: Punctate calcifications within the spleen compatible with prior granulomatous disease. Adrenals/Urinary Tract: Unremarkable appearance of adrenal glands. Right: Renal cortical thinning. Low-density rounded cyst on the posterior cortex of the right kidney compatible of Bosniak 1 cyst. No hydronephrosis or nephrolithiasis. Unremarkable course of the right ureter. Left: Symmetric perfusion of the left kidney to the right. Renal cortical thinning. No hydronephrosis or nephrolithiasis. Unremarkable course of the left ureter. Unremarkable appearance of the urinary bladder . Stomach/Bowel: Unremarkable appearance of the stomach. Unremarkable appearance of small bowel. No evidence of obstruction. Normal appendix. Colonic diverticula. No associated inflammatory changes. Lymphatic: No lymphadenopathy Mesenteric: No lymph nodes within the mesenteric. No free fluid or free air. Reproductive: Unremarkable uterus and adnexae Other: Small fat containing umbilical hernia Musculoskeletal: No acute fracture. Degenerative changes throughout the visualized thoracolumbar spine. No bony canal narrowing. Facet changes bilaterally of the lower lumbar spine. Vacuum disc phenomenon throughout the lumbar spine. IMPRESSION: Infrarenal abdominal aortic aneurysm measuring 6.1 cm. There are mild inflammatory changes/ edema surrounding the aneurysm sac. Although nonspecific, this could represent mycotic  aneurysm or other nonspecific inflammatory changes. Extensive irregular plaque of the descending thoracic aorta and the suprarenal abdominal aorta with no dissection identified. These results were called by telephone at the time of interpretation on 11/24/2016 at 11:39 am to Dr. Lorre Nick , who verbally acknowledged these results. Mild bilateral iliac and common femoral artery disease. 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 Cardiomegaly with single vessel  coronary artery disease. Evidence of pulmonary hypertension. Referral for pulmonary evaluation may be useful. Calcified mediastinal lymph nodes and spleen calcifications compatible with prior granulomatous disease. Hepatomegaly and liver steatosis. Diverticular disease without evidence of acute diverticulitis. Signed, Yvone Neu. Loreta Ave, DO [facility] Electronically Signed   By: Gilmer Mor D.O.   On: 11/24/2016 11:44   Anti-infectives: Anti-infectives    Start     Dose/Rate Route Frequency Ordered Stop   11/26/16 0300  ciprofloxacin (CIPRO) IVPB 400 mg    Comments:  Dr. Imogene Burn would like to have this in addition to surgical prophylaxis.   400 mg 200 mL/hr over 60 Minutes Intravenous Every 24 hours 11/25/16 0940 11/26/16 0337   11/25/16 0400  ciprofloxacin (CIPRO) IVPB 400 mg  Status:  Discontinued    Comments:  Dr. Imogene Burn would like to have this in addition to surgical prophylaxis.   400 mg 200 mL/hr over 60 Minutes Intravenous Every 12 hours 11/24/16 2056 11/25/16 0940   11/24/16 2200  cefUROXime (ZINACEF) 1.5 g in dextrose 5 % 50 mL IVPB     1.5 g 100 mL/hr over 30 Minutes Intravenous Every 12 hours 11/24/16 2056 11/25/16 1019   11/24/16 1615  ciprofloxacin (CIPRO) IVPB 400 mg     400 mg 200 mL/hr over 60 Minutes Intravenous To Surgery 11/24/16 1602 11/24/16 1710      Assessment/Plan: s/p  Procedure(s): ABDOMINAL AORTIC ENDOVASCULAR STENT GRAFT REPAIR (N/A) Stable postop day 2. Does not feel comfortable with discharge. Will continue to mobilize today with probable discharge tomorrow   LOS: 2 days   Sue Roach 11/26/2016, 10:44 AM

## 2016-11-26 NOTE — Progress Notes (Signed)
ANTICOAGULATION CONSULT NOTE  Pharmacy Consult for Warfarin Indication: a-flutter  Patient Measurements: Height: 5' (152.4 cm) Weight: 258 lb 1.6 oz (117.1 kg) IBW/kg (Calculated) : 45.5  Vital Signs: Temp: 97.7 F (36.5 C) (10/06 0415) Temp Source: Oral (10/06 0415) BP: 116/69 (10/06 0808) Pulse Rate: 57 (10/06 0808)  Labs:  Recent Labs  11/24/16 0802 11/24/16 1653 11/24/16 1830 11/25/16 0509  HGB 9.2* 7.1* 8.2* 8.2*  HCT 29.5* 21.0* 26.0* 26.0*  PLT 245  --  209 201  APTT 65*  --  46*  --   LABPROT 15.3*  --  17.5*  --   INR 1.22  --  1.45  --   CREATININE 2.07*  --  1.95* 1.97*   Estimated Creatinine Clearance: 28 mL/min (A) (by C-G formula based on SCr of 1.97 mg/dL (H)).  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 She has a-flutter w/ CHADSVASc of 6 and pharmacy consulted to dose coumadin. She was noted on coumadin at last admission and required up to  po daily.  Would anticipate need for at least 7.5mg  po daily  Goal of Therapy:  INR 2-3 Monitor platelets by anticoagulation protocol: Yes   Plan:  Warfarin 7.5mg  x1 Daily INR  Harland German, Pharm D 11/26/2016 1:21 PM

## 2016-11-26 NOTE — Progress Notes (Signed)
Progress Note  Patient Name: Sue Roach Date of Encounter: 11/26/2016  Primary Cardiologist: Herbie Baltimore  Subjective   Feels well no dyspnea   Inpatient Medications    Scheduled Meds: . aspirin EC  81 mg Oral Daily  . carvedilol  3.125 mg Oral BID  . furosemide  120 mg Oral BID  . heparin  5,000 Units Subcutaneous Q8H  . hydrALAZINE  25 mg Oral TID  . insulin aspart  0-15 Units Subcutaneous TID WC  . isosorbide mononitrate  30 mg Oral Daily  . pantoprazole  40 mg Oral Daily  . Warfarin - Pharmacist Dosing Inpatient   Does not apply q1800   Continuous Infusions: . sodium chloride    . sodium chloride    . sodium chloride Stopped (11/26/16 0811)  . magnesium sulfate 1 - 4 g bolus IVPB     PRN Meds: sodium chloride, acetaminophen **OR** acetaminophen, benzonatate, bisacodyl, docusate sodium, guaiFENesin-dextromethorphan, hydrALAZINE, magnesium sulfate 1 - 4 g bolus IVPB, morphine injection, ondansetron, oxyCODONE-acetaminophen, phenol, polyethylene glycol   Vital Signs    Vitals:   11/25/16 2000 11/25/16 2100 11/26/16 0415 11/26/16 0808  BP:  137/64 (!) 116/48 116/69  Pulse: 89   (!) 57  Resp: 19   19  Temp:  97.6 F (36.4 C) 97.7 F (36.5 C)   TempSrc:  Oral Oral   SpO2: 92%   96%  Weight:   258 lb 1.6 oz (117.1 kg)   Height:        Intake/Output Summary (Last 24 hours) at 11/26/16 1023 Last data filed at 11/26/16 0900  Gross per 24 hour  Intake              850 ml  Output              825 ml  Net               25 ml   Filed Weights   11/24/16 0802 11/24/16 2100 11/26/16 0415  Weight: 240 lb (108.9 kg) 254 lb 6.6 oz (115.4 kg) 258 lb 1.6 oz (117.1 kg)    Telemetry    Flutter rates 80 - Personally Reviewed  ECG    Flutter RBBB no acute changes  - Personally Reviewed  Physical Exam  Obese white female  GEN: No acute distress.   Neck: No JVD Cardiac: RRR, no murmurs, rubs, or gallops.  Respiratory: Clear to auscultation bilaterally. GI:  Soft, nontender, non-distended  MS: No edema; No deformity. Neuro:  Nonfocal  Psych: Normal affect  Echymosis RFA from EVAR Plus one bilateral edema with varicosities   Labs    Chemistry Recent Labs Lab 11/24/16 0802 11/24/16 1653 11/24/16 1830 11/25/16 0509  NA 132* 133* 133* 132*  K 3.2* 2.9* 3.1* 2.8*  CL 92*  --  95* 98*  CO2 28  --  27 29  GLUCOSE 196*  --  117* 79  BUN 55*  --  43* 39*  CREATININE 2.07*  --  1.95* 1.97*  CALCIUM 8.3*  --  7.9* 7.5*  PROT 7.2  --   --   --   ALBUMIN 2.7*  --   --   --   AST 15  --   --   --   ALT 16  --   --   --   ALKPHOS 89  --   --   --   BILITOT 0.7  --   --   --   GFRNONAA 22*  --  24* 23*  GFRAA 25*  --  27* 27*  ANIONGAP 12  --  11 5     Hematology Recent Labs Lab 11/24/16 0802 11/24/16 1653 11/24/16 1830 11/25/16 0509  WBC 11.4*  --  12.5* 9.7  RBC 3.46*  --  3.04* 3.00*  HGB 9.2* 7.1* 8.2* 8.2*  HCT 29.5* 21.0* 26.0* 26.0*  MCV 85.3  --  85.5 86.7  MCH 26.6  --  27.0 27.3  MCHC 31.2  --  31.5 31.5  RDW 18.4*  --  18.4* 17.9*  PLT 245  --  209 201    Cardiac EnzymesNo results for input(s): TROPONINI in the last 168 hours. No results for input(s): TROPIPOC in the last 168 hours.   BNP Recent Labs Lab 11/25/16 0500  BNP 348.0*     DDimer No results for input(s): DDIMER in the last 168 hours.   Radiology    Dg Chest Port 1 View  Result Date: 11/24/2016 CLINICAL DATA:  Status post AAA repair using bifurcation graft, central line placement EXAM: PORTABLE CHEST 1 VIEW COMPARISON:  11/15/2016 FINDINGS: Left-sided central venous catheter tip overlies the venous confluence. No pneumothorax is seen. Cardiomegaly with central vascular congestion. Mild diffuse interstitial opacities suspicious for mild edema. No large effusion. Aortic atherosclerosis. IMPRESSION: 1. Left-sided central venous catheter tip overlies the venous confluence. Negative for pneumothorax 2. Cardiomegaly with central congestion and mild  pulmonary edema. Electronically Signed   By: Jasmine Pang M.D.   On: 11/24/2016 20:03   Ct Angio Chest/abd/pel For Dissection W And/or W/wo  Result Date: 11/24/2016 CLINICAL DATA:  76 year old female with a history of back pain. Prior plain film radiograph demonstrates abdominal aortic aneurysm. EXAM: CT ANGIOGRAPHY CHEST, ABDOMEN AND PELVIS TECHNIQUE: Multidetector CT imaging through the chest, abdomen and pelvis was performed using the standard protocol during bolus administration of intravenous contrast. Multiplanar reconstructed images and MIPs were obtained and reviewed to evaluate the vascular anatomy. CONTRAST:  100 cc Isovue 370 COMPARISON:  None. FINDINGS: CTA CHEST FINDINGS Cardiovascular: Heart: Cardiomegaly. No pericardial fluid/ thickening. Minimal calcifications of the right coronary artery. Calcifications of the aortic valve. Aorta: Ascending aorta measures 3.3 cm with no dissection. Mild calcifications of the aortic arch. Branch vessels are patent with calcifications of the innominate artery, left common carotid artery origin, left subclavian artery origin. Branch vessels patent. Proximal left and right common carotid arteries patent. Proximal left and right vertebral arteries patent. There is symmetric contrast within the left and right subclavian artery with no occlusion identified. Irregular soft plaque throughout the length of the descending thoracic aorta with no dissection identified. No aneurysm of the thoracic aorta. No periaortic fluid at the descending thoracic aorta. Pulmonary arteries: Main pulmonary artery measures 4.2 cm. The pulmonary arteries are enlarged at the hour 1/3 of lung, greater than the accompanying bronchi. No filling defects of the main pulmonary artery, lobar, segmental or proximal subsegmental pulmonary arteries. Mediastinum/Nodes: Fluid within the pericardial recesses. There are several mediastinal lymph nodes with calcifications. No enlarged mediastinal lymph  nodes. Unremarkable thyroid and thoracic inlet. Lungs/Pleura: Respiratory motion somewhat limits evaluation of the lungs. Atelectasis/scarring at the lung bases. No pleural effusion. No evidence of lobar pneumonia. No pneumothorax. Musculoskeletal: No displaced fracture. Degenerative changes of the spine. Review of the MIP images confirms the above findings. CTA ABDOMEN AND PELVIS FINDINGS VASCULAR Aorta: Infrarenal abdominal aortic aneurysm with the greatest diameter measuring approximately 6.1 cm. Irregular calcified and soft plaque along the length of the abdominal aorta. No  significant circumferential thrombus at the aneurysm sac. There is circumferential edema/inflammatory change at the aneurysm sac with no periaortic fluid. No evidence of dissection or intramural hematoma. Celiac: Atherosclerotic changes at the origin of the celiac artery which remains patent. SMA: Atherosclerotic changes at the origin of the superior mesenteric artery which remains patent. Renals: Single renal arteries bilaterally with mild atherosclerotic changes at the origin. There may be 50% stenosis on the left. IMA: Inferior mesenteric arteries occluded at the origin. Reconstitution of left colic artery and superior rectal artery a via collateral flow. Right lower extremity: Patent right common iliac artery with calcified and soft plaque. Hypogastric artery is patent as well as the branch vessels into the pelvis. External iliac artery is patent. Common femoral artery patent with mild atherosclerotic changes. Proximal SFA and profunda femoris patent. Left lower extremity: Patent left common iliac artery with calcified and soft plaque. Hypogastric artery is patent. External iliac artery patent. Mild atherosclerotic changes of the common femoral artery. Profunda femoris and the proximal superficial femoral artery patent. Veins: Unremarkable appearance of the venous system. Review of the MIP images confirms the above findings. NON-VASCULAR  Hepatobiliary: Cranial caudal span of the right liver margin measures 19.5 cm. Diffusely low-density liver parenchyma. Unremarkable appearance of the gallbladder with no inflammatory changes. Pancreas: Unremarkable pancreas Spleen: Punctate calcifications within the spleen compatible with prior granulomatous disease. Adrenals/Urinary Tract: Unremarkable appearance of adrenal glands. Right: Renal cortical thinning. Low-density rounded cyst on the posterior cortex of the right kidney compatible of Bosniak 1 cyst. No hydronephrosis or nephrolithiasis. Unremarkable course of the right ureter. Left: Symmetric perfusion of the left kidney to the right. Renal cortical thinning. No hydronephrosis or nephrolithiasis. Unremarkable course of the left ureter. Unremarkable appearance of the urinary bladder . Stomach/Bowel: Unremarkable appearance of the stomach. Unremarkable appearance of small bowel. No evidence of obstruction. Normal appendix. Colonic diverticula. No associated inflammatory changes. Lymphatic: No lymphadenopathy Mesenteric: No lymph nodes within the mesenteric. No free fluid or free air. Reproductive: Unremarkable uterus and adnexae Other: Small fat containing umbilical hernia Musculoskeletal: No acute fracture. Degenerative changes throughout the visualized thoracolumbar spine. No bony canal narrowing. Facet changes bilaterally of the lower lumbar spine. Vacuum disc phenomenon throughout the lumbar spine. IMPRESSION: Infrarenal abdominal aortic aneurysm measuring 6.1 cm. There are mild inflammatory changes/ edema surrounding the aneurysm sac. Although nonspecific, this could represent mycotic aneurysm or other nonspecific inflammatory changes. Extensive irregular plaque of the descending thoracic aorta and the suprarenal abdominal aorta with no dissection identified. These results were called by telephone at the time of interpretation on 11/24/2016 at 11:39 am to Dr. Lorre Nick , who verbally acknowledged  these results. Mild bilateral iliac and common femoral artery disease. 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 Cardiomegaly with single vessel coronary artery disease. Evidence of pulmonary hypertension. Referral for pulmonary evaluation may be useful. Calcified mediastinal lymph nodes and spleen calcifications compatible with prior granulomatous disease. Hepatomegaly and liver steatosis. Diverticular disease without evidence of acute diverticulitis. Signed, Yvone Neu. Loreta Ave, DO [facility] Electronically Signed   By: Gilmer Mor D.O.   On: 11/24/2016 11:44    Cardiac Studies  Echo 11/07/16 EF 55-60% pulmonary HTN estimated PA 58 mmHg Mild MR RV moderately dilated   Patient Profile     76 y.o. female chronic diastolic CHF , COPD with pulmonary HTN DM CKD history of PAF. Admitted with acute AAA post  EVAR repair   Assessment & Plan    1) Diastolic CHF :  Continue high dose lasix 120 bid to keep I/O's negative or even.  2) Flutter: rate control is fine resume coumadin per pharmacy  For questions or updates, please contact [facility] Please consult [email or web address] for contact info under Cardiology/STEMI.      Signed, Charlton Haws, MD  11/26/2016, 10:23 AM

## 2016-11-26 NOTE — Progress Notes (Signed)
Pt assisted to wash up in the room. Pt ambulated 50 ft in the room. Declined ambulation in the hall.   Pt politely refuses warfarin. States she wants to check with her PCP before restarting. States that she needs to be able to eat greens so she is eating enough. Educated patient that eating a consistent amount of greens was important for dosing of coumadin. Pt states she does not like the constant blood checks. Educated patient on importance of blood thinner. Will continue to re-educate. Recommended pt speak with the MD in the morning regarding the warfarin.  Leonidas Romberg, RN

## 2016-11-27 DIAGNOSIS — I714 Abdominal aortic aneurysm, without rupture: Principal | ICD-10-CM

## 2016-11-27 LAB — BASIC METABOLIC PANEL
Anion gap: 11 (ref 5–15)
Anion gap: 11 (ref 5–15)
BUN: 43 mg/dL — AB (ref 6–20)
BUN: 44 mg/dL — ABNORMAL HIGH (ref 6–20)
CALCIUM: 8 mg/dL — AB (ref 8.9–10.3)
CALCIUM: 8.5 mg/dL — AB (ref 8.9–10.3)
CO2: 22 mmol/L (ref 22–32)
CO2: 21 mmol/L — ABNORMAL LOW (ref 22–32)
CREATININE: 2.71 mg/dL — AB (ref 0.44–1.00)
CREATININE: 2.69 mg/dL — AB (ref 0.44–1.00)
Chloride: 98 mmol/L — ABNORMAL LOW (ref 101–111)
Chloride: 101 mmol/L (ref 101–111)
GFR, EST AFRICAN AMERICAN: 18 mL/min — AB (ref >60)
GFR, EST AFRICAN AMERICAN: 19 mL/min — AB (ref >60)
GFR, EST NON AFRICAN AMERICAN: 16 mL/min — AB (ref >60)
GFR, EST NON AFRICAN AMERICAN: 16 mL/min — AB (ref >60)
GLUCOSE: 154 mg/dL — AB (ref 65–99)
Glucose, Bld: 105 mg/dL — ABNORMAL HIGH (ref 65–99)
Potassium: 4.6 mmol/L (ref 3.5–5.1)
Potassium: 4.8 mmol/L (ref 3.5–5.1)
SODIUM: 133 mmol/L — AB (ref 135–145)
Sodium: 131 mmol/L — ABNORMAL LOW (ref 135–145)

## 2016-11-27 LAB — GLUCOSE, CAPILLARY: GLUCOSE-CAPILLARY: 134 mg/dL — AB (ref 65–99)

## 2016-11-27 LAB — PROTIME-INR
INR: 1.55
PROTHROMBIN TIME: 18.4 s — AB (ref 11.4–15.2)

## 2016-11-27 MED ORDER — WARFARIN SODIUM 7.5 MG PO TABS: 7.5000 mg | ORAL_TABLET | Freq: Once | ORAL | Status: DC

## 2016-11-27 NOTE — Progress Notes (Signed)
Progress Note  Patient Name: Sue Roach Date of Encounter: 11/27/2016  Primary Cardiologist: Herbie Baltimore  Subjective   Feels well no dyspnea   Inpatient Medications    Scheduled Meds: . aspirin EC  81 mg Oral Daily  . carvedilol  3.125 mg Oral BID  . furosemide  120 mg Oral BID  . heparin  5,000 Units Subcutaneous Q8H  . hydrALAZINE  25 mg Oral TID  . insulin aspart  0-15 Units Subcutaneous TID WC  . isosorbide mononitrate  30 mg Oral Daily  . pantoprazole  40 mg Oral Daily  . warfarin  7.5 mg Oral ONCE-1800  . Warfarin - Pharmacist Dosing Inpatient   Does not apply q1800   Continuous Infusions: . sodium chloride    . sodium chloride    . sodium chloride Stopped (11/26/16 0811)  . magnesium sulfate 1 - 4 g bolus IVPB     PRN Meds: sodium chloride, acetaminophen **OR** acetaminophen, benzonatate, bisacodyl, docusate sodium, guaiFENesin-dextromethorphan, hydrALAZINE, magnesium sulfate 1 - 4 g bolus IVPB, morphine injection, ondansetron, oxyCODONE-acetaminophen, phenol, polyethylene glycol   Vital Signs    Vitals:   11/26/16 0808 11/26/16 1300 11/26/16 1945 11/27/16 0356  BP: 116/69 (!) 124/55 132/63 121/73  Pulse: (!) 57 60 87 84  Resp: Temp:  97.6 F (36.4 C) 97.8 F (36.6 C) 97.8 F (36.6 C)  TempSrc:  Oral Oral Oral  SpO2: 96% 96% 95% 90%  Weight:      Height:        Intake/Output Summary (Last 24 hours) at 11/27/16 0803 Last data filed at 11/27/16 0359  Gross per 24 hour  Intake              410 ml  Output              400 ml  Net               10 ml   Filed Weights   11/24/16 0802 11/24/16 2100 11/26/16 0415  Weight: 240 lb (108.9 kg) 254 lb 6.6 oz (115.4 kg) 258 lb 1.6 oz (117.1 kg)    Telemetry    Flutter rates 80 - Personally Reviewed  ECG    Flutter RBBB no acute changes  - Personally Reviewed  Physical Exam  Obese white female  Affect appropriate Healthy:  appears stated age HEENT: normal Neck supple with no  adenopathy JVP normal no bruits no thyromegaly Lungs clear with no wheezing and good diaphragmatic motion Heart:  S1/S2 no murmur, no rub, gallop or click PMI normal Abdomen: benighn, BS positve, no tenderness, no AAA no bruit.  No HSM or HJR Distal pulses intact with no bruits Neuro non-focal Skin warm and dry No muscular weakness  Plus 2 bilateral edema with varicosities   Labs    Chemistry Recent Labs Lab 11/24/16 0802  11/24/16 1830 11/25/16 0509 11/27/16 0319  NA 132*  < > 133* 132* 131*  K 3.2*  < > 3.1* 2.8* 4.6  CL 92*  --  95* 98* 98*  CO2 28  --  GLUCOSE 196*  --  117* 79 154*  BUN 55*  --  43* 39* 43*  CREATININE 2.07*  --  1.95* 1.97* 2.71*  CALCIUM 8.3*  --  7.9* 7.5* 8.0*  PROT 7.2  --   --   --   --   ALBUMIN 2.7*  --   --   --   --  AST 15  --   --   --   --   ALT 16  --   --   --   --   ALKPHOS 89  --   --   --   --   BILITOT 0.7  --   --   --   --   GFRNONAA 22*  --  24* 23* 16*  GFRAA 25*  --  27* 27* 18*  ANIONGAP 12  --  < > = values in this interval not displayed.   Hematology  Recent Labs Lab 11/24/16 0802 11/24/16 1653 11/24/16 1830 11/25/16 0509  WBC 11.4*  --  12.5* 9.7  RBC 3.46*  --  3.04* 3.00*  HGB 9.2* 7.1* 8.2* 8.2*  HCT 29.5* 21.0* 26.0* 26.0*  MCV 85.3  --  85.5 86.7  MCH 26.6  --  27.0 27.3  MCHC 31.2  --  31.5 31.5  RDW 18.4*  --  18.4* 17.9*  PLT 245  --  209 201    Cardiac EnzymesNo results for input(s): TROPONINI in the last 168 hours. No results for input(s): TROPIPOC in the last 168 hours.   BNP  Recent Labs Lab 11/25/16 0500  BNP 348.0*     DDimer No results for input(s): DDIMER in the last 168 hours.   Radiology    No results found.  Cardiac Studies  Echo 11/07/16 EF 55-60% pulmonary HTN estimated PA 58 mmHg Mild MR RV moderately dilated   Patient Profile     76 y.o. female chronic diastolic CHF , COPD with pulmonary HTN DM CKD history of PAF. Admitted with acute AAA post  EVAR repair   Assessment & Plan    1) Diastolic CHF :  Would hold lasix today and f/u BMET in am make sure  Cr is closer to baseline around 2.0 She is on her normal oral dose but Cr has jumped up this am  2) Flutter: rate control is fine resume coumadin per pharmacy  For questions or updates, please contact [facility] Please consult [email or web address] for contact info under Cardiology/STEMI.      Signed, Charlton Haws, MD  11/27/2016, 8:03 AM

## 2016-11-27 NOTE — Clinical Social Work Note (Addendum)
Clinical Social Work Assessment  Patient Details  Name: Sue Roach MRN: [id number] Date of Birth: [birth year]  Date of referral:  11/27/16               Reason for consult:  Discharge Planning, Facility Placement                Permission sought to share information with:  Family Supports Permission granted to share information::  Yes, Verbal Permission Granted  Name::     Riki Altes  Agency::     Relationship::  relative  Contact Information:  [phone]  Housing/Transportation Living arrangements for the past 2 months:  [place] Point of Information:  Patient Patient Interpreter Needed:  None Criminal Activity/Legal Involvement Pertinent to Current Situation/Hospitalization:  No - Comment as needed Significant Relationships:  Adult Children, Neighbor Lives with:  Self Do you feel safe going back to the place where you live?  No Need for family participation in patient care:  Yes (Comment)  Care giving concerns: No family at bedside. Patient stated she lives at home by herself. Patient stated she has support from neighbors and her adult children  Social Worker assessment / plan:  Holiday representative met patient at bedside to discuss disposition plan. Patient stated she does not want to go to a SNF after discharge. Patient stated SNF's are a horrible place and she would rather not go. Patient stated she wants to go home and has arranged for PT, RN and an aide to come into her home 1-2 a week. Patient stated he aide will assist with her ADLs. Patient stated her daughter and neighbor will check in on her from time to time if she has needs. Patient stated she would like a bedside commode for her home. CSW will let RNCM aware of patient request.  CSW signing off as patient no longer has social work needs.  Employment status:  Retired Nurse, adult PT Recommendations:  [place] / Referral to community  resources:  [place]  Patient/Family's Response to care:  Patient appreciate CSW role in care. Patient stated she is happy to be discharging home and feels safe being their versus SNF  Patient/Family's Understanding of and Emotional Response to Diagnosis, Current Treatment, and Prognosis:  Patient stated she has never felt pain like this before and is extremely happy she called EMS when she did. Patient seems to be in a better mood than before. Patient verbalized understanding of current hospitalization and limitations  Emotional Assessment Appearance:  Appears stated age Attitude/Demeanor/Rapport:  Other (appropriate) Affect (typically observed):  Pleasant, Appropriate Orientation:  Oriented to Situation, Oriented to  Time, Oriented to Place, Oriented to Self Alcohol / Substance use:  Not Applicable Psych involvement (Current and /or in the community):  No (Comment)  Discharge Needs  Concerns to be addressed:  No discharge needs identified Readmission within the last 30 days:  No Current discharge risk:  None Barriers to Discharge:  No Barriers Identified   Wende Neighbors, LCSW 11/27/2016, 3:11 PM

## 2016-11-27 NOTE — Discharge Instructions (Addendum)
Information on my medicine - Coumadin®   (Warfarin) ° °This medication education was reviewed with me or my healthcare representative as part of my discharge preparation.   ° °Why was Coumadin prescribed for you? °Coumadin was prescribed for you because you have a blood clot or a medical condition that can cause an increased risk of forming blood clots. Blood clots can cause serious health problems by blocking the flow of blood to the heart, lung, or brain. Coumadin can prevent harmful blood clots from forming. °As a reminder your indication for Coumadin is:   Stroke Prevention Because Of Atrial Fibrillation ° °What test will check on my response to Coumadin? °While on Coumadin (warfarin) you will need to have an INR test regularly to ensure that your dose is keeping you in the desired range. The INR (international normalized ratio) number is calculated from the result of the laboratory test called prothrombin time (PT). ° °If an INR APPOINTMENT HAS NOT ALREADY BEEN MADE FOR YOU please schedule an appointment to have this lab work done by your health care provider within 7 days. °Your INR goal is usually a number between:  2 to 3 or your provider may give you a more narrow range like 2-2.5.  Ask your health care provider during an office visit what your goal INR is. ° °What  do you need to  know  About  COUMADIN? °Take Coumadin (warfarin) exactly as prescribed by your healthcare provider about the same time each day.  DO NOT stop taking without talking to the doctor who prescribed the medication.  Stopping without other blood clot prevention medication to take the place of Coumadin may increase your risk of developing a new clot or stroke.  Get refills before you run out. ° °What do you do if you miss a dose? °If you miss a dose, take it as soon as you remember on the same day then continue your regularly scheduled regimen the next day.  Do not take two doses of Coumadin at the same time. ° °Important Safety  Information °A possible side effect of Coumadin (Warfarin) is an increased risk of bleeding. You should call your healthcare provider right away if you experience any of the following: °? Bleeding from an injury or your nose that does not stop. °? Unusual colored urine (red or dark brown) or unusual colored stools (red or black). °? Unusual bruising for unknown reasons. °? A serious fall or if you hit your head (even if there is no bleeding). ° °Some foods or medicines interact with Coumadin® (warfarin) and might alter your response to warfarin. To help avoid this: °? Eat a balanced diet, maintaining a consistent amount of Vitamin K. °? Notify your provider about major diet changes you plan to make. °? Avoid alcohol or limit your intake to 1 drink for women and 2 drinks for men per day. °(1 drink is 5 oz. wine, 12 oz. beer, or 1.5 oz. liquor.) ° °Make sure that ANY health care provider who prescribes medication for you knows that you are taking Coumadin (warfarin).  Also make sure the healthcare provider who is monitoring your Coumadin knows when you have started a new medication including herbals and non-prescription products. ° °Coumadin® (Warfarin)  Major Drug Interactions  °Increased Warfarin Effect Decreased Warfarin Effect  °Alcohol (large quantities) °Antibiotics (esp. Septra/Bactrim, Flagyl, Cipro) °Amiodarone (Cordarone) °Aspirin (ASA) °Cimetidine (Tagamet) °Megestrol (Megace) °NSAIDs (ibuprofen, naproxen, etc.) °Piroxicam (Feldene) °Propafenone (Rythmol SR) °Propranolol (Inderal) °Isoniazid (INH) °Posaconazole (Noxafil) Barbiturates (Phenobarbital) °  Carbamazepine (Tegretol) Chlordiazepoxide (Librium) Cholestyramine (Questran) Griseofulvin Oral Contraceptives Rifampin Sucralfate (Carafate) Vitamin K   Coumadin (Warfarin) Major Herbal Interactions  Increased Warfarin Effect Decreased Warfarin Effect  Garlic Ginseng Ginkgo biloba Coenzyme Q10 Green tea St. Johns wort    Coumadin (Warfarin)  FOOD Interactions  Eat a consistent number of servings per week of foods HIGH in Vitamin K (1 serving =  cup)  Collards (cooked, or boiled & drained) Kale (cooked, or boiled & drained) Mustard greens (cooked, or boiled & drained) Parsley *serving size only =  cup Spinach (cooked, or boiled & drained) Swiss chard (cooked, or boiled & drained) Turnip greens (cooked, or boiled & drained)  Eat a consistent number of servings per week of foods MEDIUM-HIGH in Vitamin K (1 serving = 1 cup)  Asparagus (cooked, or boiled & drained) Broccoli (cooked, boiled & drained, or raw & chopped) Brussel sprouts (cooked, or boiled & drained) *serving size only =  cup Lettuce, raw (green leaf, endive, romaine) Spinach, raw Turnip greens, raw & chopped   These websites have more information on Coumadin (warfarin):  http://www.king-russell.com/; https://www.hines.net/;    [facility]   Discharge Instructions  Endovascular Aortic Aneurysm Repair  Please refer to the following instructions for your post-procedure care. Your surgeon or Physician Assistant will discuss any changes with you.  Activity  You are encouraged to walk as much as you can. You can slowly return to normal activities but must avoid strenuous activity and heavy lifting until your doctor tells you it's OK. Avoid activities such as vacuuming or swinging a gold club. It is normal to feel tired for several weeks after your surgery. Do not drive until your doctor gives the OK and you are no longer taking prescription pain medications. It is also normal to have difficulty with sleep habits, eating, and bowel movements after surgery. These will go away with time.  Bathing/Showering  You may shower after you go home. If you have an incision, do not soak in a bathtub, hot tub, or swim until the incision heals completely.  If you have incisions in your groin, sash the groin wounds with soap and water daily  and pat dry. (No tub bath-only shower)  Then put a dry gauze or washcloth there to keep this area dry to help prevent wound infection daily and as needed.  Do not use Vaseline or neosporin on your incisions.  Only use soap and water on your incisions and then protect and keep dry.  Incision Care  Shower every day. Clean your incision with mild soap and water. Pat the area dry with a clean towel. You do not need a bandage unless otherwise instructed. Do not apply any ointments or creams to your incision. If you clothing is irritating, you may cover your incision with a dry gauze pad.  Wash the groin wound with soap and water daily and pat dry. (No tub bath-only shower)  Then put a dry gauze or washcloth there to keep this area dry daily and as needed.  Do not use Vaseline or neosporin on your incisions.  Only use soap and water on your incisions and then protect and keep dry.   Diet  Resume your normal diet. There are no special food restrictions following this procedure. A low fat/low cholesterol diet is recommended for all patients with vascular disease. In order to heal from your surgery, it is CRITICAL to get adequate nutrition. Your body requires vitamins, minerals, and protein. Vegetables are  the best source of vitamins and minerals. Vegetables also provide the perfect balance of protein. Processed food has little nutritional value, so try to avoid this.  Medications  Resume taking all of your medications unless your doctor or nurse practitioner tells you not to. If your incision is causing pain, you may take over-the-counter pain relievers such as acetaminophen (Tylenol). If you were prescribed a stronger pain medication, please be aware these medications can cause nausea and constipation. Prevent nausea by taking the medication with a snack or meal. Avoid constipation by drinking plenty of fluids and eating foods with a high amount of fiber, such as fruits, vegetables, and grains. Do not take  Tylenol if you are taking prescription pain medications.   Follow up  Our office will schedule a follow-up appointment with a C.T. scan 3-4 weeks after your surgery.  Please call us immediately for any of the following conditions  Severe or worsening pain in your legs or feet or in your abdomen back or chest. Increased pain, redness, drainage (pus) from your incision sit. Increased abdominal pain, bloating, nausea, vomiting or persistent diarrhea. Fever of 101 degrees or higher. Swelling in your leg (s),  Reduce your risk of vascular disease  Stop smoking. If you would like help call [facility] at 1-800-QUIT-NOW (6090490228) or [place] at [phone]. Manage your cholesterol Maintain a desired weight Control your diabetes Keep your blood pressure down  If you have questions, please call the office at [phone].

## 2016-11-27 NOTE — Progress Notes (Signed)
Pt ambulating around the room throughout the day. Politely declines to ambulate in the hallway. Complaints of stomach discomfort - PRN Miralax given. Recommended increased mobility. Pt is independent with Jozsef Wescoat in the room. Will continue to monitor.   Leonidas Romberg, RN

## 2016-11-27 NOTE — Progress Notes (Addendum)
ANTICOAGULATION CONSULT NOTE  Pharmacy Consult for Warfarin Indication: a-flutter  Patient Measurements: Height: 5' (152.4 cm) Weight: 258 lb 1.6 oz (117.1 kg) IBW/kg (Calculated) : 45.5  Vital Signs: Temp: 98.3 F (36.8 C) (10/07 0815) Temp Source: Oral (10/07 0815) BP: 124/65 (10/07 0815) Pulse Rate: 84 (10/07 0356)  Labs:  Recent Labs  11/24/16 1653  11/24/16 1830 11/25/16 0509 11/27/16 0319 11/27/16 0820  HGB 7.1*  --  8.2* 8.2*  --   --   HCT 21.0*  --  26.0* 26.0*  --   --   PLT  --   --  209 201  --   --   APTT  --   --  46*  --   --   --   LABPROT  --   --  17.5*  --  18.4*  --   INR  --   --  1.45  --  1.55  --   CREATININE  --   < > 1.95* 1.97* 2.71* 2.69*  < > = values in this interval not displayed. Estimated Creatinine Clearance: 20.5 mL/min (A) (by C-G formula based on SCr of 2.69 mg/dL (H)).  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 She has a-flutter w/ CHADSVASc of 6 and pharmacy consulted to dose coumadin. She was noted on coumadin at last admission and required up to  po daily.  Would anticipate need for at least 7.5mg  po daily -INR= 1.55 w/ trend up -She refused her coumadin dose on 10/6  Goal of Therapy:  INR 2-3 Monitor platelets by anticoagulation protocol: Yes   Plan:  Warfarin 7.5mg  x1 Daily INR Will provide anticoagulant education  Harland German, Pharm D 11/27/2016 12:09 PM

## 2016-11-27 NOTE — Progress Notes (Addendum)
Subjective: Interval History: none.. Comfortable overall. Back to her baseline functional status  Objective: Vital signs in last 24 hours: Temp:  [97.6 F (36.4 C)-98.3 F (36.8 C)] 98.3 F (36.8 C) (10/07 0815) Pulse Rate:  [60-87] 84 (10/07 0356) Resp:  [14-18] 18 (10/07 0815) BP: (121-132)/(55-73) 124/65 (10/07 0815) SpO2:  [90 %-96 %] 91 % (10/07 0815)  Intake/Output from previous day: 10/06 0701 - 10/07 0700 In: 410 [P.O.:410] Out: 400 [Urine:400] Intake/Output this shift: No intake/output data recorded.  Groin wounds without infection  Lab Results:  Recent Labs  11/24/16 1830 11/25/16 0509  WBC 12.5* 9.7  HGB 8.2* 8.2*  HCT 26.0* 26.0*  PLT 209 201   BMET  Recent Labs  11/27/16 0319 11/27/16 0820  NA 131* 133*  K 4.6 4.8  CL 98* 101  CO2 22 21*  GLUCOSE 154* 105*  BUN 43* 44*  CREATININE 2.71* 2.69*  CALCIUM 8.0* 8.5*    Studies/Results: Dg Chest 2 View  Result Date: 11/15/2016 CLINICAL DATA:  Cough, shortness of breath, and chest congestion. History of CHF. EXAM: CHEST  2 VIEW COMPARISON:  PA and lateral chest x-ray of November 13, 2016 FINDINGS: The lungs are well-expanded. There is right middle lobe atelectasis which is more conspicuous today. The cardiac silhouette is enlarged. The pulmonary vascularity is mildly prominent centrally. The bony thorax exhibits no acute abnormality. IMPRESSION: Progressive right middle lobe atelectasis. Follow-up radiographs following anticipated antibiotic therapy are recommended to assure clearing. The findings may be secondary to a central obstructing lesion and ultimately chest CT scanning may be needed. Mild CHF, stable. Thoracic aortic atherosclerosis. Electronically Signed   By: David  [place] M.D.   On: 11/15/2016 12:51   Dg Chest 2 View  Result Date: 11/13/2016 CLINICAL DATA:  Hypoxia EXAM: CHEST  2 VIEW COMPARISON:  11/05/2016 FINDINGS: Cardiac silhouette is mildly enlarged. There is vascular congestion and  bilateral interstitial thickening similar to the prior exam. Additional linear lung base opacities noted consistent with atelectasis, also similar to the prior study. No evidence of pneumonia. There are stable calcified granuloma in the right mid lung associated with right hilar calcified nodes. No pleural effusion.  No pneumothorax. Skeletal structures are demineralized but grossly intact. IMPRESSION: 1. Mild congestive heart failure. Electronically Signed   By: Amie Portland M.D.   On: 11/13/2016 17:20   Dg Chest 2 View  Result Date: 11/05/2016 CLINICAL DATA:  CHF. EXAM: CHEST  2 VIEW COMPARISON:  Chest x-ray dated November 04, 2016. FINDINGS: Stable moderate cardiomegaly. Mild pulmonary vascular congestion. Mild basal predominant interstitial thickening, similar to prior study. Bibasilar atelectasis. No focal consolidation, pleural effusion, or pneumothorax. No acute osseous abnormality. IMPRESSION: Stable cardiomegaly with mild interstitial edema. Electronically Signed   By: Obie Dredge M.D.   On: 11/05/2016 08:42   Dg Chest 2 View  Result Date: 11/04/2016 CLINICAL DATA:  76 year old female with a history of decreased oxygenation EXAM: CHEST  2 VIEW COMPARISON:  02/04/2016, 01/28/2016 FINDINGS: Cardiomediastinal silhouette again enlarged with cardiomegaly. Calcifications of the aortic arch. No pneumothorax. Interlobular septal thickening with coarsened interstitial markings. No large pleural effusion. No confluent airspace disease. Degenerative changes of the spine.  No displaced fracture IMPRESSION: Evidence of Mamadou Breon edema with no large pleural effusion. Cardiomegaly. Electronically Signed   By: Gilmer Mor D.O.   On: 11/04/2016 13:50   Dg Lumbar Spine Complete  Addendum Date: 11/24/2016   ADDENDUM REPORT: 11/24/2016 09:14 ADDENDUM: Study discussed by telephone with Dr. Loren Racer on 11/24/2016 at  0902 hours. Electronically Signed   By: Odessa Fleming M.D.   On: 11/24/2016 09:14   Result Date:  11/24/2016 CLINICAL DATA:  76 year old female with lumbar back pain for 2 days with no known injury. EXAM: LUMBAR SPINE - COMPLETE 4+ VIEW COMPARISON:  Abdomen radiographs 11/07/2016, and earlier chest radiographs. FINDINGS: Large rim calcified infrarenal abdominal aortic aneurysm, up to 77 mm diameter judging from the lateral radiographic appearance. Aortoiliac calcified atherosclerosis. Partially lumbarized S1 level suspected with full size ribs at T12. Otherwise normal lumbar segmentation. Mild grade 1 anterolisthesis of L5 on S1 with advanced disc and posterior element degeneration. Mild disc space loss and endplate spurring elsewhere. Degenerative appearing sclerotic inferior bilateral SI joints. No acute osseous abnormality identified. IMPRESSION: 1. Very large infrarenal abdominal aortic aneurysm. Recommend follow-up CT or CTA. Aortic Atherosclerosis (ICD10-I70.0). 2.  No acute osseous abnormality identified in the lumbar spine. Electronically Signed: By: Odessa Fleming M.D. On: 11/24/2016 08:58   Dg Abd 1 View  Result Date: 11/07/2016 CLINICAL DATA:  Abdominal distention. EXAM: ABDOMEN - 1 VIEW COMPARISON:  None. FINDINGS: The bowel gas pattern is normal. No radio-opaque calculi or other significant radiographic abnormality are seen. No visible free air or free fluid. No discrete bone abnormality. IMPRESSION: Benign-appearing abdomen. Electronically Signed   By: Francene Boyers M.D.   On: 11/07/2016 13:56   Dg Chest Port 1 View  Result Date: 11/24/2016 CLINICAL DATA:  Status post AAA repair using bifurcation graft, central line placement EXAM: PORTABLE CHEST 1 VIEW COMPARISON:  11/15/2016 FINDINGS: Left-sided central venous catheter tip overlies the venous confluence. No pneumothorax is seen. Cardiomegaly with central vascular congestion. Mild diffuse interstitial opacities suspicious for mild edema. No large effusion. Aortic atherosclerosis. IMPRESSION: 1. Left-sided central venous catheter tip overlies the  venous confluence. Negative for pneumothorax 2. Cardiomegaly with central congestion and mild pulmonary edema. Electronically Signed   By: Jasmine Pang M.D.   On: 11/24/2016 20:03   Ct Angio Chest/abd/pel For Dissection W And/or W/wo  Result Date: 11/24/2016 CLINICAL DATA:  76 year old female with a history of back pain. Prior plain film radiograph demonstrates abdominal aortic aneurysm. EXAM: CT ANGIOGRAPHY CHEST, ABDOMEN AND PELVIS TECHNIQUE: Multidetector CT imaging through the chest, abdomen and pelvis was performed using the standard protocol during bolus administration of intravenous contrast. Multiplanar reconstructed images and MIPs were obtained and reviewed to evaluate the vascular anatomy. CONTRAST:  100 cc Isovue 370 COMPARISON:  None. FINDINGS: CTA CHEST FINDINGS Cardiovascular: Heart: Cardiomegaly. No pericardial fluid/ thickening. Minimal calcifications of the right coronary artery. Calcifications of the aortic valve. Aorta: Ascending aorta measures 3.3 cm with no dissection. Mild calcifications of the aortic arch. Branch vessels are patent with calcifications of the innominate artery, left common carotid artery origin, left subclavian artery origin. Branch vessels patent. Proximal left and right common carotid arteries patent. Proximal left and right vertebral arteries patent. There is symmetric contrast within the left and right subclavian artery with no occlusion identified. Irregular soft plaque throughout the length of the descending thoracic aorta with no dissection identified. No aneurysm of the thoracic aorta. No periaortic fluid at the descending thoracic aorta. Pulmonary arteries: Main pulmonary artery measures 4.2 cm. The pulmonary arteries are enlarged at the hour 1/3 of lung, greater than the accompanying bronchi. No filling defects of the main pulmonary artery, lobar, segmental or proximal subsegmental pulmonary arteries. Mediastinum/Nodes: Fluid within the pericardial recesses.  There are several mediastinal lymph nodes with calcifications. No enlarged mediastinal lymph nodes. Unremarkable thyroid and thoracic  inlet. Lungs/Pleura: Respiratory motion somewhat limits evaluation of the lungs. Atelectasis/scarring at the lung bases. No pleural effusion. No evidence of lobar pneumonia. No pneumothorax. Musculoskeletal: No displaced fracture. Degenerative changes of the spine. Review of the MIP images confirms the above findings. CTA ABDOMEN AND PELVIS FINDINGS VASCULAR Aorta: Infrarenal abdominal aortic aneurysm with the greatest diameter measuring approximately 6.1 cm. Irregular calcified and soft plaque along the length of the abdominal aorta. No significant circumferential thrombus at the aneurysm sac. There is circumferential edema/inflammatory change at the aneurysm sac with no periaortic fluid. No evidence of dissection or intramural hematoma. Celiac: Atherosclerotic changes at the origin of the celiac artery which remains patent. SMA: Atherosclerotic changes at the origin of the superior mesenteric artery which remains patent. Renals: Single renal arteries bilaterally with mild atherosclerotic changes at the origin. There may be 50% stenosis on the left. IMA: Inferior mesenteric arteries occluded at the origin. Reconstitution of left colic artery and superior rectal artery a via collateral flow. Right lower extremity: Patent right common iliac artery with calcified and soft plaque. Hypogastric artery is patent as well as the branch vessels into the pelvis. External iliac artery is patent. Common femoral artery patent with mild atherosclerotic changes. Proximal SFA and profunda femoris patent. Left lower extremity: Patent left common iliac artery with calcified and soft plaque. Hypogastric artery is patent. External iliac artery patent. Mild atherosclerotic changes of the common femoral artery. Profunda femoris and the proximal superficial femoral artery patent. Veins: Unremarkable  appearance of the venous system. Review of the MIP images confirms the above findings. NON-VASCULAR Hepatobiliary: Cranial caudal span of the right liver margin measures 19.5 cm. Diffusely low-density liver parenchyma. Unremarkable appearance of the gallbladder with no inflammatory changes. Pancreas: Unremarkable pancreas Spleen: Punctate calcifications within the spleen compatible with prior granulomatous disease. Adrenals/Urinary Tract: Unremarkable appearance of adrenal glands. Right: Renal cortical thinning. Low-density rounded cyst on the posterior cortex of the right kidney compatible of Bosniak 1 cyst. No hydronephrosis or nephrolithiasis. Unremarkable course of the right ureter. Left: Symmetric perfusion of the left kidney to the right. Renal cortical thinning. No hydronephrosis or nephrolithiasis. Unremarkable course of the left ureter. Unremarkable appearance of the urinary bladder . Stomach/Bowel: Unremarkable appearance of the stomach. Unremarkable appearance of small bowel. No evidence of obstruction. Normal appendix. Colonic diverticula. No associated inflammatory changes. Lymphatic: No lymphadenopathy Mesenteric: No lymph nodes within the mesenteric. No free fluid or free air. Reproductive: Unremarkable uterus and adnexae Other: Small fat containing umbilical hernia Musculoskeletal: No acute fracture. Degenerative changes throughout the visualized thoracolumbar spine. No bony canal narrowing. Facet changes bilaterally of the lower lumbar spine. Vacuum disc phenomenon throughout the lumbar spine. IMPRESSION: Infrarenal abdominal aortic aneurysm measuring 6.1 cm. There are mild inflammatory changes/ edema surrounding the aneurysm sac. Although nonspecific, this could represent mycotic aneurysm or other nonspecific inflammatory changes. Extensive irregular plaque of the descending thoracic aorta and the suprarenal abdominal aorta with no dissection identified. These results were called by telephone at  the time of interpretation on 11/24/2016 at 11:39 am to Dr. Lorre Nick , who verbally acknowledged these results. Mild bilateral iliac and common femoral artery disease. 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 Cardiomegaly with single vessel coronary artery disease. Evidence of pulmonary hypertension. Referral for pulmonary evaluation may be useful. Calcified mediastinal lymph nodes and spleen calcifications compatible with prior granulomatous disease. Hepatomegaly and liver steatosis. Diverticular disease without evidence of acute diverticulitis. Signed,  Yvone Neu. Loreta Ave, DO [facility] Radiology Electronically Signed   By: Gilmer Mor D.O.   On: 11/24/2016 11:44   Anti-infectives: Anti-infectives    Start     Dose/Rate Route Frequency Ordered Stop   11/26/16 0300  ciprofloxacin (CIPRO) IVPB 400 mg    Comments:  Dr. Imogene Burn would like to have this in addition to surgical prophylaxis.   400 mg 200 mL/hr over 60 Minutes Intravenous Every 24 hours 11/25/16 0940 11/26/16 0337   11/25/16 0400  ciprofloxacin (CIPRO) IVPB 400 mg  Status:  Discontinued    Comments:  Dr. Imogene Burn would like to have this in addition to surgical prophylaxis.   400 mg 200 mL/hr over 60 Minutes Intravenous Every 12 hours 11/24/16 2056 11/25/16 0940   11/24/16 2200  cefUROXime (ZINACEF) 1.5 g in dextrose 5 % 50 mL IVPB     1.5 g 100 mL/hr over 30 Minutes Intravenous Every 12 hours 11/24/16 2056 11/25/16 1019   11/24/16 1615  ciprofloxacin (CIPRO) IVPB 400 mg     400 mg 200 mL/hr over 60 Minutes Intravenous To Surgery 11/24/16 1602 11/24/16 1710      Assessment/Plan: s/p Procedure(s): ABDOMINAL AORTIC ENDOVASCULAR STENT GRAFT REPAIR (N/A) Feels that she is back to her baseline. Will hold discharge until tomorrow to assure that outpatient Coumadin bridge is successful. Also is being  diuresed and will determine this. Probable discharge tomorrow   LOS: 3 days   Harold Mattes 11/27/2016, 11:30 AM

## 2016-11-28 DIAGNOSIS — Z9119 Patient's noncompliance with other medical treatment and regimen: Secondary | ICD-10-CM

## 2016-11-28 LAB — BASIC METABOLIC PANEL
Anion gap: 12 (ref 5–15)
BUN: 50 mg/dL — AB (ref 6–20)
CALCIUM: 8.2 mg/dL — AB (ref 8.9–10.3)
CO2: 21 mmol/L — AB (ref 22–32)
Chloride: 99 mmol/L — ABNORMAL LOW (ref 101–111)
Creatinine, Ser: 2.47 mg/dL — ABNORMAL HIGH (ref 0.44–1.00)
GFR calc Af Amer: 21 mL/min — ABNORMAL LOW (ref >60)
GFR, EST NON AFRICAN AMERICAN: 18 mL/min — AB (ref >60)
GLUCOSE: 139 mg/dL — AB (ref 65–99)
Potassium: 4.8 mmol/L (ref 3.5–5.1)
Sodium: 132 mmol/L — ABNORMAL LOW (ref 135–145)

## 2016-11-28 LAB — PROTIME-INR
INR: 1.31
PROTHROMBIN TIME: 16.1 s — AB (ref 11.4–15.2)

## 2016-11-28 LAB — GLUCOSE, CAPILLARY
Glucose-Capillary: 120 mg/dL — ABNORMAL HIGH (ref 65–99)
Glucose-Capillary: 124 mg/dL — ABNORMAL HIGH (ref 65–99)
Glucose-Capillary: 109 mg/dL — ABNORMAL HIGH (ref 65–99)

## 2016-11-28 MED ORDER — WARFARIN SODIUM 7.5 MG PO TABS: 7.5000 mg | ORAL_TABLET | Freq: Once | ORAL | Status: DC

## 2016-11-28 MED ORDER — OXYCODONE-ACETAMINOPHEN 5-325 MG PO TABS: 1.0000 | ORAL_TABLET | Freq: Four times a day (QID) | ORAL | 0 refills | Status: DC | PRN

## 2016-11-28 MED ORDER — WARFARIN SODIUM 5 MG PO TABS: 5.0000 mg | ORAL_TABLET | Freq: Every day | ORAL | 3 refills | Status: AC

## 2016-11-28 MED ORDER — FUROSEMIDE 80 MG PO TABS
120.0000 mg | ORAL_TABLET | Freq: Two times a day (BID) | ORAL | Status: DC
  Administered 2016-11-28: 12:00:00 120 mg via ORAL
  Filled 2016-11-28: qty 1

## 2016-11-28 NOTE — Progress Notes (Signed)
ANTICOAGULATION CONSULT NOTE  Pharmacy Consult for Warfarin Indication: a-flutter  Patient Measurements: Height: 5' (152.4 cm) Weight: 258 lb 1.6 oz (117.1 kg) IBW/kg (Calculated) : 45.5  Vital Signs: Temp: 97.4 F (36.3 C) (10/08 0403) Temp Source: Oral (10/08 0403) BP: 121/83 (10/08 0403) Pulse Rate: 77 (10/08 0403)  Labs:  Recent Labs  11/27/16 0319 11/27/16 0820 11/28/16 0244  LABPROT 18.4*  --  16.1*  INR 1.55  --  1.31  CREATININE 2.71* 2.69* 2.47*   Estimated Creatinine Clearance: 22.3 mL/min (A) (by C-G formula based on SCr of 2.47 mg/dL (H)).  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 She has a-flutter w/ CHADSVASc of 6 and pharmacy consulted to dose coumadin due to cost concerns with alternative agent, apixaban, during previous admission. During last admission when started on warfarin, she required up to  po daily.   INR changed from 1.55 to 1.31 today. Last CBC was stable- will order lab for tomorrow. On concurrent subQ heparin for prophylaxis while INR is subtherapeutic. No signs/symptoms of bleeding. -She refused her coumadin dose on 10/6 and 10/7.   Goal of Therapy:  INR 2-3 Monitor platelets by anticoagulation protocol: Yes   Plan:  Warfarin 7.5mg  x1 Daily INR CBC ordered for 10/9  Girard Cooter, PharmD Clinical Pharmacist  Phone: [phone]  11/28/2016 8:57 AM

## 2016-11-28 NOTE — Progress Notes (Signed)
Patient to be discharged to home. [facility] delivered a 3in1 commode and an oxygen tank to bedside. PIV x2 removed. Telemetry removed; CCMD notified. RN offered to give patient her PM dose of coumadin and lasix before she was discharged. Patient stated she had those medications at home and would take them once she got there. Then while reviewing her discharge paperwork she stated she did not have coumadin at home and would not be able to take it until she saw her PCP. When RN questioned her about just previously stating she had it at home she stated, "Well, I guess I lied then." Patient continued to refuse to take her coumadin and lasix prior to discharge. [employer] cab services called and picked patient up for transport home.   Leanna Battles, RN

## 2016-11-28 NOTE — Care Management Note (Signed)
Case Management Note Donn Pierini RN, BSN Unit 4E-Case Manager [phone]  Patient Details  Name: Sue Roach MRN: [id number] Date of Birth: 04-Jul-1940  Subjective/Objective:    Pt admitted with Abdominal aortic aneurysm (AAA)- s/p repair 11/25/16               Action/Plan: PTA pt lived at home- was active with [facility] for HHRN/aide- spoke with Corrie Dandy with [facility] to confirm- per PT -recommendation for SNF- pt has refused and wants to return home with [facility] services- HHPT has been added- pt also wants 3n1- which has been ordered- notified Jermaine with [facility] for DME needs 3n1 has been delivered to room. Pt states she need transportation assistance home- will need taxi- pt states she will pay for cab as she lives in [facility]- pt has home 02 with [facility]- will need portable tank for transport home. Jermaine with [facility] to bring 02 tank to home for transport will also have someone go to home to check home 02 and make sure it is working properly.- [facility] services with [facility] confirmed with Corrie Dandy for resumption of services- Mary aware of pt's discharge for today-   Expected Discharge Date:     11/28/16             Expected Discharge Plan:  Home w Home Health Services  In-House Referral:     Discharge planning Services  CM Consult  Post Acute Care Choice:  Durable Medical Equipment, Home Health, Resumption of Svcs/PTA Provider Choice offered to:  Patient  DME Arranged:  3-N-1 DME Agency:  [facility]  HH Arranged:  RN, PT, Nurse's Aide HH Agency:  [facility] ([facility]  Status of Service:  Completed, signed off  If discussed at [employer] of Stay Meetings, dates discussed:    Discharge Disposition: home/home health   Additional Comments:  Darrold Span, RN 11/28/2016, 11:43 AM

## 2016-11-28 NOTE — Discharge Summary (Signed)
EVAR Discharge Summary   Sue Roach 1940-02-23 76 y.o. female  MRN: [id number]  Admission Date: 11/24/2016  Discharge Date: 11/28/16  Physician: Fransisco Hertz, MD  Admission Diagnosis: Abdominal aortic aneurysm (AAA) greater than 5.0 cm in diameter in female [facility]) [I71.4]   HPI:   This is a 76 y.o. female with CKD, CHF, COPD and new onset aflutter who presents with chief complaint: back pain since Tuesday.  The patient notes baseline chronic back pain, but notes this new pain is unlike her prior pain.  The pain is vague in character, persistent moderate to severe in intensity.  Patient thinks the pain has increased since Tuesday.  The patient's risk factors for AAA included: active smoking.  The patient was recently admitted with acute on chronic CHF.  Hospital Course:  The patient was admitted to the hospital and taken to the operating room on 11/24/2016 and underwent: 1. Left common femoral artery cannulation under ultrasound guidance 2. "Preclose" repair of left common femoral artery, i.e. proglide placement x 2 3. Endovascular aortic repair with main body with attached limb (Gore C3 28 mm x 14 mm x 12 cm) 4. Placement of ipsilateral iliac limb extension (Gore 12 mm x 10 cm)  (To be dictated by Dr. Edilia Bo) 5. Left common femoral artery cannulation under ultrasound guidance 6. "Preclose" repair of left common femoral artery, i.e. proglide placement x 2 Placement contralateral iliac limb (Gore 16 mm x 11.5 cm)    Intraoperative findings as follows: FINDING(S): 1. Successful exclusion of aneurysm at end of case  2.  Endoleaks: small type 2 endoleak proximally 3. Bilateral patent renal arteries and internal iliac arteries 4.  Left renal artery stenosis >70% 5. Distal pedal signals: left dorsalis pedis artery dopplerable, right anterior tibial artery and posterior tibial artery dopplerable  The pt tolerated the procedure well and was transported to the PACU in  good condition.   Post operatively, her feet are viable with doppler signals bilaterally.  Her UOP was good and she was hemodynamically stable.     On POD 1, she did have a soft BP while sleeping.  She had no clinical signs of active bleeding.  Not certain etiology of H/H drop: hemodilution vs true bleed ? There no evidence of bleeding from AAA on aortogram ? Completion aortogram demonstrates complete exclusion of AAA H/H is not appropriate for two unit transfusion: hydration and lack of Lasix yesterday might be partially responsible.  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  Coumadin per pharmacy. From vascular standpoint, she had not ambulated much and did not feel comfortable with discharge.     On POD 3, lasix was held and creatinine on POD 4, has improved to 2.47, which is trending downward.  She felt that she was back to baseline, but discharge was held to assure the outpatient coumadin bridge is successful.     On POD 4, from a vascular standpoint, pt is stable to discharge.    This patient has  baseline significant co-morbidities which are still active.  Pt has been somewhat reluctant to cooperate with prescribed care: refuses SNF placement, refuses coumadin  Pt can continue her CHF titration and aflutter anticoagulation as outpatient. D/C on home services arranged.    Discussed with Dr. Imogene Burn and will have her resume her home dose of Lasix, Rx for coumadin and get INR checked this week with PCP if she takes her  coumadin.  She will need an appointment this week with PCP as she wants to talk to him prior to taking coumadin.    Per Cardiology on 11/28/16:    1. Persistent atrial flutter - Rate controlled. CHADSVASC of at least 6. She was not discharged on anticoagulation last admission as she had refused it. This admission she is refusing again. Had at length discussion (greather than 30 minutes) regarding need of anticoagulation and risk of stoke.  Nurse was at beside during my conversation. Also spoke with pharmacist and vascular PA. However she is continue to refuse stating "will talk with primary care first". Coumadin rx witting by vascular PA.  Refills per PCP.  The remainder of the hospital course consisted of increasing mobilization and increasing intake of solids without difficulty.  CBC    Component Value Date/Time   WBC 9.7 11/25/2016 0509   RBC 3.00 (L) 11/25/2016 0509   HGB 8.2 (L) 11/25/2016 0509   HCT 26.0 (L) 11/25/2016 0509   PLT 201 11/25/2016 0509   MCV 86.7 11/25/2016 0509   MCH 27.3 11/25/2016 0509   MCHC 31.5 11/25/2016 0509   RDW 17.9 (H) 11/25/2016 0509   LYMPHSABS 0.5 (L) 11/09/2016 0231   MONOABS 0.4 11/09/2016 0231   EOSABS 0.0 11/09/2016 0231   BASOSABS 0.0 11/09/2016 0231    BMET    Component Value Date/Time   NA 132 (L) 11/28/2016 0244   NA 140 02/11/2016   K 4.8 11/28/2016 0244   CL 99 (L) 11/28/2016 0244   CO2 21 (L) 11/28/2016 0244   GLUCOSE 139 (H) 11/28/2016 0244   BUN 50 (H) 11/28/2016 0244   BUN 55 (A) 02/11/2016   CREATININE 2.47 (H) 11/28/2016 0244   CALCIUM 8.2 (L) 11/28/2016 0244   GFRNONAA 18 (L) 11/28/2016 0244   GFRAA 21 (L) 11/28/2016 0244         Discharge Diagnosis:  Abdominal aortic aneurysm (AAA) greater than 5.0 cm in diameter in female [facility]) [I71.4]  Secondary Diagnosis: Patient Active Problem List   Diagnosis Date Noted  . AAA (abdominal aortic aneurysm) (HCC) 11/24/2016  . Acute on chronic right-sided congestive heart  failure (HCC) 11/04/2016  . Diabetes mellitus without complication (HCC) 11/04/2016  . Hypertension, uncontrolled 11/04/2016  . Hypothyroidism, adult 11/04/2016  . Acute respiratory failure with hypoxemia (HCC) 11/04/2016  . CKD (chronic kidney disease) stage 4, GFR 15-29 ml/min (HCC) 11/04/2016  . Chronic anemia 11/04/2016  . Atrial flutter (HCC) 11/04/2016  . Acute on chronic respiratory failure with hypoxemia (HCC) 02/15/2016  . 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 Hypothyroidism 02/15/2016  . Atrial flutter, paroxysmal (HCC) 02/15/2016  . Shingles   . Kidney disease   . CHF exacerbation (HCC)   . Advance care planning   . Palliative care by specialist   . Goals of care, counseling/discussion   . COPD exacerbation (HCC)   . Obstructive sleep apnea   . Obesity hypoventilation syndrome (HCC)   . Chronic systolic CHF (congestive heart failure) (HCC)   . Dilated cardiomyopathy (HCC)   .  Pulmonary hypertension (HCC)   . Controlled diabetes mellitus type 2 with complications (HCC)   . Acute renal failure with acute tubular necrosis superimposed on stage 2 chronic kidney disease (HCC)   . CHF (congestive heart failure) (HCC) 01/27/2016  . Acute respiratory failure with hypoxia (HCC) 01/27/2016  . Chronic kidney disease 01/27/2016  . Diabetes mellitus, type II (HCC) 01/27/2016  . Leg wound, left 01/27/2016  . Anemia of chronic disease 01/27/2016  . Tobacco abuse 01/27/2016  . Diabetes mellitus with complication (HCC)   . Impetigo 05/29/2015  . Morbid obesity due to excess calories (HCC) 05/29/2015  . Acute on chronic diastolic congestive heart failure (HCC) 05/29/2015   Past Medical History:  Diagnosis Date  . CHF (congestive heart failure) (HCC)   . Diabetes mellitus without complication (HCC)   . Hypertension   . Kidney disease   . Renal disorder   . Shingles      Allergies as of 11/28/2016      Reactions   Sulfa Antibiotics  Other (See Comments)   unknown   Coreg [carvedilol] Rash   Hair loss   Zinc Rash      Medication List    TAKE these medications   acetaminophen 325 MG tablet Commonly known as:  TYLENOL Take 2 tablets (650 mg total) by mouth every 4 (four) hours as needed for mild pain, moderate pain, fever or headache.   aspirin EC 81 MG tablet Take 81 mg by mouth daily.   benzonatate 100 MG capsule Commonly known as:  TESSALON Take 1 capsule (100 mg total) by mouth 3 (three) times daily as needed for cough.   carvedilol 3.125 MG tablet Commonly known as:  COREG Take 3.125 mg by mouth 2 (two) times daily.   docusate sodium 100 MG capsule Commonly known as:  COLACE Take 100 mg by mouth 2 (two) times daily as needed for constipation.   furosemide 40 MG tablet Commonly known as:  LASIX Take 3 tablets (120 mg total) by mouth 2 (two) times daily.   glimepiride 4 MG tablet Commonly known as:  AMARYL Take 4 mg by mouth daily.   hydrALAZINE 25 MG tablet Commonly known as:  APRESOLINE Take 25 mg by mouth 3 (three) times daily.   isosorbide mononitrate 30 MG 24 hr tablet Commonly known as:  IMDUR Take 1 tablet (30 mg total) by mouth daily.   oxyCODONE-acetaminophen 5-325 MG tablet Commonly known as:  PERCOCET/ROXICET Take 1 tablet by mouth every 6 (six) hours as needed for moderate pain.   pioglitazone 45 MG tablet Commonly known as:  ACTOS Take 45 mg by mouth daily.   VENTOLIN HFA 108 (90 Base) MCG/ACT inhaler Generic drug:  albuterol Inhale 2 puffs into the lungs daily.   warfarin 5 MG tablet Commonly known as:  COUMADIN Take 1 tablet (5 mg total) by mouth daily.            Durable Medical Equipment        Start     Ordered   11/28/16 0728  For home use only DME 3 n 1  Once     11/28/16 0727      Discharge Instructions:  [facility]  Discharge Instructions Endovascular Aortic Aneurysm Repair  Please refer to the following  instructions for your post-procedure care. Your surgeon or Physician Assistant will discuss any changes with you.  Activity  You are encouraged to walk as much as you can. You can slowly return  to normal activities but must avoid strenuous activity and heavy lifting until your doctor tells you it's OK. Avoid activities such as vacuuming or swinging a gold club. It is normal to feel tired for several weeks after your surgery. Do not drive until your doctor gives the OK and you are no longer taking prescription pain medications. It is also normal to have difficulty with sleep habits, eating, and bowel movements after surgery. These will go away with time.  Bathing/Showering  You may shower after you go home. If you have an incision, do not soak in a bathtub, hot tub, or swim until the incision heals completely.  Incision Care  Shower every day. Clean your incision with mild soap and water. Pat the area dry with a clean towel. You do not need a bandage unless otherwise instructed. Do not apply any ointments or creams to your incision. If you clothing is irritating, you may cover your incision with a dry gauze pad.  Wash the groin wound with soap and water daily and pat dry. (No tub bath-only shower)  Then put a dry gauze or washcloth there to keep this area dry daily and as needed.  Do not use Vaseline or neosporin on your incisions.  Only use soap and water on your incisions and then protect and keep dry.  Diet  Resume your normal diet. There are no special food restrictions following this procedure. A low fat/low cholesterol diet is recommended for all patients with vascular disease. In order to heal from your surgery, it is CRITICAL to get adequate nutrition. Your body requires vitamins, minerals, and protein. Vegetables are the best source of vitamins and minerals. Vegetables also provide the perfect balance of protein. Processed food has little nutritional value, so try to avoid  this.  Medications  Resume taking all of your medications unless your doctor or Physician Assistnat tells you not to. If your incision is causing pain, you may take over-the-counter pain relievers such as acetaminophen (Tylenol). If you were prescribed a stronger pain medication, please be aware these medications can cause nausea and constipation. Prevent nausea by taking the medication with a snack or meal. Avoid constipation by drinking plenty of fluids and eating foods with a high amount of fiber, such as fruits, vegetables, and grains. Do not take Tylenol if you are taking prescription pain medications. Spoke with Dr. Duke Salvia and pt will resume home dose of Lasix on 11/29/16.   Follow up  Our office will schedule a follow-up appointment with a C.T. scan 3-4 weeks after your surgery.  Please call us immediately for any of the following conditions  Severe or worsening pain in your legs or feet or in your abdomen back or chest. Increased pain, redness, drainage (pus) from your incision sit. Increased abdominal pain, bloating, nausea, vomiting or persistent diarrhea. Fever of 101 degrees or higher. Swelling in your leg (s),  Reduce your risk of vascular disease  .Stop smoking. If you would like help call [facility] at 1-800-QUIT-NOW ((657)610-4762) or [place] at [phone]. .Manage your cholesterol .Maintain a desired weight .Control your diabetes .Keep your blood pressure down  If you have questions, please call the office at [phone].  -------------------------------------------------------------------------------  Information on my medicine - Coumadin   (Warfarin)  This medication education was reviewed with me or my healthcare representative as part of my discharge preparation.    Why was Coumadin prescribed for you? Coumadin was prescribed for you because you have a blood clot or a medical condition  that can cause an increased risk of forming blood clots. Blood  clots can cause serious health problems by blocking the flow of blood to the heart, lung, or brain. Coumadin can prevent harmful blood clots from forming. As a reminder your indication for Coumadin is:   Stroke Prevention Because Of Atrial Fibrillation  What test will check on my response to Coumadin? While on Coumadin (warfarin) you will need to have an INR test regularly to ensure that your dose is keeping you in the desired range. The INR (international normalized ratio) number is calculated from the result of the laboratory test called prothrombin time (PT).  If an INR APPOINTMENT HAS NOT ALREADY BEEN MADE FOR YOU please schedule an appointment to have this lab work done by your health care provider within 7 days. Your INR goal is usually a number between:  2 to 3 or your provider may give you a more narrow range like 2-2.5.  Ask your health care provider during an office visit what your goal INR is.  What  do you need to  know  About  COUMADIN? Take Coumadin (warfarin) exactly as prescribed by your healthcare provider about the same time each day.  DO NOT stop taking without talking to the doctor who prescribed the medication.  Stopping without other blood clot prevention medication to take the place of Coumadin may increase your risk of developing a new clot or stroke.  Get refills before you run out.  What do you do if you miss a dose? If you miss a dose, take it as soon as you remember on the same day then continue your regularly scheduled regimen the next day.  Do not take two doses of Coumadin at the same time.  Important Safety Information A possible side effect of Coumadin (Warfarin) is an increased risk of bleeding. You should call your healthcare provider right away if you experience any of the following: ? Bleeding from an injury or your nose that does not stop. ? Unusual colored urine (red or dark brown) or unusual colored stools (red or black). ? Unusual bruising for unknown  reasons. ? A serious fall or if you hit your head (even if there is no bleeding).  Some foods or medicines interact with Coumadin (warfarin) and might alter your response to warfarin. To help avoid this: ? Eat a balanced diet, maintaining a consistent amount of Vitamin K. ? Notify your provider about major diet changes you plan to make. ? Avoid alcohol or limit your intake to 1 drink for women and 2 drinks for men per day. (1 drink is 5 oz. wine, 12 oz. beer, or 1.5 oz. liquor.)  Make sure that ANY health care provider who prescribes medication for you knows that you are taking Coumadin (warfarin).  Also make sure the healthcare provider who is monitoring your Coumadin knows when you have started a new medication including herbals and non-prescription products.  Coumadin (Warfarin)  Major Drug Interactions  Increased Warfarin Effect Decreased Warfarin Effect  Alcohol (large quantities) Antibiotics (esp. Septra/Bactrim, Flagyl, Cipro) Amiodarone (Cordarone) Aspirin (ASA) Cimetidine (Tagamet) Megestrol (Megace) NSAIDs (ibuprofen, naproxen, etc.) Piroxicam (Feldene) Propafenone (Rythmol SR) Propranolol (Inderal) Isoniazid (INH) Posaconazole (Noxafil) Barbiturates (Phenobarbital) Carbamazepine (Tegretol) Chlordiazepoxide (Librium) Cholestyramine (Questran) Griseofulvin Oral Contraceptives Rifampin Sucralfate (Carafate) Vitamin K   Coumadin (Warfarin) Major Herbal Interactions  Increased Warfarin Effect Decreased Warfarin Effect  Garlic Ginseng Ginkgo biloba Coenzyme Q10 Green tea St. John's wort    Coumadin (Warfarin) FOOD Interactions  Eat a  consistent number of servings per week of foods HIGH in Vitamin K (1 serving =  cup)  Collards (cooked, or boiled & drained) Kale (cooked, or boiled & drained) Mustard greens (cooked, or boiled & drained) Parsley *serving size only =  cup Spinach (cooked, or boiled & drained) Swiss chard (cooked, or boiled & drained) Turnip  greens (cooked, or boiled & drained)  Eat a consistent number of servings per week of foods MEDIUM-HIGH in Vitamin K (1 serving = 1 cup)  Asparagus (cooked, or boiled & drained) Broccoli (cooked, boiled & drained, or raw & chopped) Brussel sprouts (cooked, or boiled & drained) *serving size only =  cup Lettuce, raw (green leaf, endive, romaine) Spinach, raw Turnip greens, raw & chopped   These websites have more information on Coumadin (warfarin):  http://www.king-russell.com/; https://www.hines.net/;    Prescriptions given: 1.  Roxicet #8 No Refill 2.  Coumadin 5mg  daily #30 3RF (further refills per PCP)--pt refusing to take until she speaks with PCP.    Disposition: home (refuses SNF)  Patient's condition: is Fair  Follow up: 1. Dr. Imogene Burn in 4 weeks with CTA protocol 2. Dr. Jeannetta Nap 11/29/16 at noon (spoke with Wilkie Aye to make appt) to discuss importance of starting coumadin.  Will also need BMP drawn and set up appt for INR checks, which will be managed by Dr. Jeannetta Nap.   Doreatha Massed, PA-C [facility] [phone] 11/28/2016  7:34 AM   Addendum  This patient had an urgent EVAR completed for sx inflammatory large AAA. This patient had significant co-morbidities including acute on chronic CHF, chronic kidney disease stage 3-4, and COPD prior to her procedure.  I expected that these issue would delay her discharge.  Her recovery from EVAR was as expected.  The EVAR did NOT resolve her back pain, suggest another etiology for that pain.  Her hospitalization was extended by her poor functional status and CHF.   PT recommended SNF but the patient refused.  Cardiology titrated her CHF mgmt and recommended anticoagulation for aflutter but patient refused to take coumadin as ordered.  By discharge, the patient is clinically stable and can be discharge for outpatient mgmt of her chronic active medical co-morbidities.  She will follow up with me in 4 weeks with a CTA  abd/pelvis to reimage the EVAR.  Leonides Sake, MD, FACS [facility] [place] Office: [phone] Pager: [phone]  11/28/2016, 11:09 AM    - For VQI Registry use - Post-op:  Time to Extubation: [x]  In OR, [ ]  < 12 hrs, [ ]  12-24 hrs, [ ]  >=24 hrs Vasopressors Req. Post-op: No MI: No., [ ]  Troponin only, [ ]  EKG or Clinical New Arrhythmia: No CHF: No ICU Stay: 0 day in ICU (has been on telemetry since operation) Transfusion: Yes     If yes, 2 units given  Complications: Resp failure: No., [ ]  Pneumonia, [ ]  Ventilator Chg in renal function: Yes.  , [x ] Inc. Cr > 0.5 (improved), [ ]  Temp. Dialysis,  [ ]  Permanent dialysis Leg ischemia: No., no Surgery needed, [ ]  Yes, Surgery needed,  [ ]  Amputation Bowel ischemia: No., [ ]  Medical Rx, [ ]  Surgical Rx Wound complication: No., [ ]  Superficial separation/infection, [ ]  Return to OR Return to OR: No  Return to OR for bleeding: No Stroke: No., [ ]  Minor, [ ]  Major  Discharge medications: Statin use:  No  ASA use:  Yes  Plavix use:  No  Beta blocker use:  Yes  ARB use:  No ACEI use:  No CCB use:  No Coumadin:  Discharged with Rx, but has refused to take here in the hospital.  Wants to talk to her PCP first.  Appointment made for 11/29/16 at noon to discuss coumadin as well as check labs.

## 2016-11-28 NOTE — Progress Notes (Signed)
Progress Note  Patient Name: Sue Roach Date of Encounter: 11/28/2016  Primary Cardiologist: Dr. Herbie Baltimore   Subjective   No chest pain and shortness of breath. Patient is refusing coumadin.   Inpatient Medications    Scheduled Meds: . aspirin EC  81 mg Oral Daily  . carvedilol  3.125 mg Oral BID  . heparin  5,000 Units Subcutaneous Q8H  . hydrALAZINE  25 mg Oral TID  . insulin aspart  0-15 Units Subcutaneous TID WC  . isosorbide mononitrate  30 mg Oral Daily  . pantoprazole  40 mg Oral Daily  . warfarin  7.5 mg Oral ONCE-1800  . Warfarin - Pharmacist Dosing Inpatient   Does not apply q1800   Continuous Infusions: . sodium chloride    . sodium chloride    . sodium chloride Stopped (11/26/16 0811)  . magnesium sulfate 1 - 4 g bolus IVPB     PRN Meds: sodium chloride, acetaminophen **OR** acetaminophen, benzonatate, bisacodyl, docusate sodium, guaiFENesin-dextromethorphan, hydrALAZINE, magnesium sulfate 1 - 4 g bolus IVPB, morphine injection, ondansetron, oxyCODONE-acetaminophen, phenol, polyethylene glycol   Vital Signs    Vitals:   11/27/16 0815 11/27/16 1643 11/27/16 1935 11/28/16 0403  BP: 124/65 138/67 (!) 124/58 121/83  Pulse:   67 77  Resp: Temp: 98.3 F (36.8 C) 97.8 F (36.6 C) 98.1 F (36.7 C) (!) 97.4 F (36.3 C)  TempSrc: Oral Oral Oral Oral  SpO2: 91% 98% 98% 98%  Weight:      Height:        Intake/Output Summary (Last 24 hours) at 11/28/16 0926 Last data filed at 11/28/16 0405  Gross per 24 hour  Intake              480 ml  Output              200 ml  Net              280 ml   Filed Weights   11/24/16 0802 11/24/16 2100 11/26/16 0415  Weight: 240 lb (108.9 kg) 254 lb 6.6 oz (115.4 kg) 258 lb 1.6 oz (117.1 kg)    Telemetry    Atrial flutter at rate of 60-70s - Personally Reviewed  ECG    N/A - Personally Reviewed  Physical Exam   GEN: No acute distress.   Neck: No JVD Cardiac: RRR, no murmurs, rubs, or  gallops.  Respiratory: Clear to auscultation bilaterally. GI: Soft, nontender, non-distended  MS: No edema; No deformity. Neuro:  Nonfocal  Psych: Normal affect   Labs    Chemistry Recent Labs Lab 11/24/16 0802  11/27/16 0319 11/27/16 0820 11/28/16 0244  NA 132*  < > 131* 133* 132*  K 3.2*  < > 4.6 4.8 4.8  CL 92*  < > 98* 101 99*  CO2 28  < > 22 21* 21*  GLUCOSE 196*  < > 154* 105* 139*  BUN 55*  < > 43* 44* 50*  CREATININE 2.07*  < > 2.71* 2.69* 2.47*  CALCIUM 8.3*  < > 8.0* 8.5* 8.2*  PROT 7.2  --   --   --   --   ALBUMIN 2.7*  --   --   --   --   AST 15  --   --   --   --   ALT 16  --   --   --   --   ALKPHOS 89  --   --   --   --  BILITOT 0.7  --   --   --   --   GFRNONAA 22*  < > 16* 16* 18*  GFRAA 25*  < > 18* 19* 21*  ANIONGAP 12  < > < > = values in this interval not displayed.   Hematology Recent Labs Lab 11/24/16 0802 11/24/16 1653 11/24/16 1830 11/25/16 0509  WBC 11.4*  --  12.5* 9.7  RBC 3.46*  --  3.04* 3.00*  HGB 9.2* 7.1* 8.2* 8.2*  HCT 29.5* 21.0* 26.0* 26.0*  MCV 85.3  --  85.5 86.7  MCH 26.6  --  27.0 27.3  MCHC 31.2  --  31.5 31.5  RDW 18.4*  --  18.4* 17.9*  PLT 245  --  209 201    Cardiac EnzymesNo results for input(s): TROPONINI in the last 168 hours. No results for input(s): TROPIPOC in the last 168 hours.   BNP Recent Labs Lab 11/25/16 0500  BNP 348.0*     DDimer No results for input(s): DDIMER in the last 168 hours.   Radiology    No results found.  Cardiac Studies   None this admisison  Patient Profile     Sue Roach is a 76 y.o. female with a hx of chronic diastolic HF, COPD, Pulmonary HTN, DM, essential HTN, CKD IV and recent long admission for CHF and new onset atrial flutter (Treated with coumadin for anticoagulation however discharge medication does not listed.  Patient refused multiple lab drawn and medication adjustments) admitted for acute AAA s/p EVAR repair.   Assessment & Plan    1. Persistent atrial flutter - Rate controlled. CHADSVASC of at least 6. She was not discharged on anticoagulation last admission as she had refused it. This admission she is refusing again. Had at length discussion (greather than 30 minutes) regarding need of anticoagulation and risk of stoke.  Nurse was at beside during my conversation. Also spoke with pharmacist and vascular PA. However she is continue to refuse stating "will talk with primary care first". Coumadin rx witting by vascular PA.   2. Acute on chronic diastolic CHF - Creatinine improving with holding lasix. Timing to resume with dose per MD.   For questions or updates, please contact [facility] Please consult [email or web address] for contact info under Cardiology/STEMI.      Signed, Manson Passey, PA  11/28/2016, 9:26 AM

## 2016-11-28 NOTE — Care Management Important Message (Signed)
Important Message  Patient Details  Name: Sue Roach MRN: [id number] Date of Birth: January 02, 1941   Medicare Important Message Given:  Yes    Kyla Balzarine 11/28/2016, 8:28 AM

## 2016-11-28 NOTE — Progress Notes (Signed)
Physical Therapy Treatment Patient Details Name: Sue Roach MRN: [id number] DOB: 29-Oct-1940 Today's Date: 11/28/2016    History of Present Illness 76 y.o. female chief complaint: back pain since Tuesday.  The patient notes baseline chronic back pain, but notes this new pain is unlike her prior pain.  The pain is vague in character, persistent moderate to severe in intensity.  Patient thinks the pain has increased since Tuesday.  The patient's risk factors for AAA included: active smoking.  The patient was recently admitted with acute on chronic CHF. s/p EVAR 11/24/16 AAA PMH DM, HTN, CKD, and R sided CHF      PT Comments    Pt has refused SNF placement but is willing to participate in HHPT to improve her strength and endurance to be able to safely navigate in her home. Pt is currently supervision for transfers and min guard for ambulation of 40 feet with RW. Pt continues to be limited in her mobility by her back pain.   Follow Up Recommendations  Home health PT;Supervision - Intermittent     Equipment Recommendations  None recommended by PT    Recommendations for Other Services OT consult     Precautions / Restrictions Restrictions Weight Bearing Restrictions: No    Mobility  Bed Mobility               General bed mobility comments: pt in recliner at entry  Transfers Overall transfer level: Needs assistance Equipment used: Rolling walker (2 wheeled) Transfers: Sit to/from Stand Sit to Stand: Supervision         General transfer comment: supervision for safety, good power up and steadying with RW  Ambulation/Gait Ambulation/Gait assistance: Min guard Ambulation Distance (Feet): 40 Feet Assistive device: Rolling walker (2 wheeled) Gait Pattern/deviations: Step-through pattern;Trunk flexed;Antalgic Gait velocity: decreased Gait velocity interpretation: Below normal speed for age/gender General Gait Details: min guard for safety, vc for upright posture and  not leaning forearms on walker to walk, pt reports that she needs to lean over due to back pain,        Balance Overall balance assessment: Needs assistance Sitting-balance support: Bilateral upper extremity supported;Feet supported Sitting balance-Leahy Scale: Poor Sitting balance - Comments: required either bilateral support or PT support to maintain seated balance on coming to upright eventually able to progress to single UE support   Standing balance support: Bilateral upper extremity supported Standing balance-Leahy Scale: Poor Standing balance comment: requires UE support and minA to maintain static standing balance                            Cognition Arousal/Alertness: Awake/alert Behavior During Therapy: WFL for tasks assessed/performed Overall Cognitive Status: Within Functional Limits for tasks assessed                                           General Comments General comments (skin integrity, edema, etc.): Pt on 2L O2 via nasal cannula, SaO2 remained above 95%O2 throughout session. other VSS      Pertinent Vitals/Pain Pain Assessment: Faces Faces Pain Scale: Hurts even more Pain Location:  (back (chronic)) Pain Descriptors / Indicators: Discomfort;Aching Pain Intervention(s): Monitored during session;Limited activity within patient's tolerance           PT Goals (current goals can now be found in the care plan section) Acute Rehab PT Goals Patient  Stated Goal: go home PT Goal Formulation: With patient Time For Goal Achievement: 12/09/16 Potential to Achieve Goals: Fair Progress towards PT goals: Progressing toward goals    Frequency    Min 3X/week      PT Plan Discharge plan needs to be updated       AM-PAC PT "6 Clicks" Daily Activity  Outcome Measure  Difficulty turning over in bed (including adjusting bedclothes, sheets and blankets)?: A Lot Difficulty moving from lying on back to sitting on the side of the bed?  : Unable Difficulty sitting down on and standing up from a chair with arms (e.g., wheelchair, bedside commode, etc,.)?: A Little Help needed moving to and from a bed to chair (including a wheelchair)?: A Lot Help needed walking in hospital room?: A Lot Help needed climbing 3-5 steps with a railing? : Total 6 Click Score: 11    End of Session Equipment Utilized During Treatment: Gait belt Activity Tolerance: Patient limited by fatigue Patient left: in chair;with call bell/phone within reach Nurse Communication: Mobility status PT Visit Diagnosis: Unsteadiness on feet (R26.81);Other abnormalities of gait and mobility (R26.89);Muscle weakness (generalized) (M62.81)     Time: 1410-1435 PT Time Calculation (min) (ACUTE ONLY): 25 min  Charges:  $Gait Training: 8-22 mins $Therapeutic Activity: 8-22 mins                    G Codes:       Shanik Brookshire B. Beverely Risen PT, DPT Acute Rehabilitation  [phone] Pager [phone]     Elon Alas Fleet 11/28/2016, 2:52 PM

## 2016-11-28 NOTE — Progress Notes (Addendum)
   Daily Progress Note   Assessment/Planning:   POD #4 s/p EVAR, Baseline CHF, CKD, COPD   From vascular viewpoint, pt stable to discharge.  This patient has baseline significant co-morbidities which are still active.  Pt has been somewhat reluctant to cooperate with prescribed care: refuses SNF placement, refuses coumadin  Pt can continue her CHF titration and aflutter anticoagulation as outpatient   D/C once home services arranged.   Subjective  - 4 Days Post-Op   Sleeping in chair   Objective   Vitals:   11/27/16 0815 11/27/16 1643 11/27/16 1935 11/28/16 0403  BP: 124/65 138/67 (!) 124/58 121/83  Pulse:   67 77  Resp: Temp: 98.3 F (36.8 C) 97.8 F (36.6 C) 98.1 F (36.7 C) (!) 97.4 F (36.3 C)  TempSrc: Oral Oral Oral Oral  SpO2: 91% 98% 98% 98%  Weight:      Height:         Intake/Output Summary (Last 24 hours) at 11/28/16 0718 Last data filed at 11/28/16 0405  Gross per 24 hour  Intake              720 ml  Output              200 ml  Net              520 ml    PULM  CTAB  CV  RRR  GI  soft, NTND  VASC B groins echymotic, inc c/d/i, viable feet    Laboratory   CBC CBC Latest Ref Rng & Units 11/25/2016 11/24/2016 11/24/2016  WBC 4.0 - 10.5 K/uL 9.7 12.5(H) -  Hemoglobin 12.0 - 15.0 g/dL 8.2(L) 8.2(L) 7.1(L)  Hematocrit 36.0 - 46.0 % 26.0(L) 26.0(L) 21.0(L)  Platelets 150 - 400 K/uL 201 209 -    BMET    Component Value Date/Time   NA 132 (L) 11/28/2016 0244   NA 140 02/11/2016   K 4.8 11/28/2016 0244   CL 99 (L) 11/28/2016 0244   CO2 21 (L) 11/28/2016 0244   GLUCOSE 139 (H) 11/28/2016 0244   BUN 50 (H) 11/28/2016 0244   BUN 55 (A) 02/11/2016   CREATININE 2.47 (H) 11/28/2016 0244   CALCIUM 8.2 (L) 11/28/2016 0244   GFRNONAA 18 (L) 11/28/2016 0244   GFRAA 21 (L) 11/28/2016 0244     Leonides Sake, MD, FACS [facility] Office: [phone] Pager: [phone]  11/28/2016, 7:18 AM

## 2016-11-30 ENCOUNTER — Other Ambulatory Visit: Payer: Self-pay

## 2016-11-30 DIAGNOSIS — I714 Abdominal aortic aneurysm, without rupture, unspecified: Secondary | ICD-10-CM

## 2016-12-01 ENCOUNTER — Telehealth: Payer: Self-pay | Admitting: Vascular Surgery

## 2016-12-01 NOTE — Telephone Encounter (Signed)
Jessica with [facility] called.  Says patient keeps refusing to take her Coumadin and wants to talk to her Pcp about alternatives. She had and appt yesterday with Pcp but did not go. Shanda Bumps wants to know if there is anything else Dr. Imogene Burn would like Home Health to do?  Her assessment did not show any drainage from her surgical sites.   Message sent to Dr. Imogene Burn for Advise.  Ernst Spell., LPN

## 2016-12-05 ENCOUNTER — Telehealth: Payer: Self-pay | Admitting: Vascular Surgery

## 2016-12-05 NOTE — Telephone Encounter (Signed)
-----   Message from Sharee Pimple, RN sent at 11/28/2016  8:36 AM EDT ----- Regarding: 4 weeks w/ CTA abd / pelvis   ----- Message ----- From: Dara Lords, PA-C Sent: 11/28/2016   7:22 AM To: Vvs Charge Pool  S/p EVAR.  F/u with Dr. Imogene Burn in 4 weeks with CTA protocol.  Thanks

## 2016-12-05 NOTE — Telephone Encounter (Signed)
Sched appt 12/06/16.CTA at 9:00 at [facility]. MD at 10:15. Lm on cell#.

## 2016-12-12 ENCOUNTER — Encounter (HOSPITAL_COMMUNITY): Payer: Self-pay | Admitting: Vascular Surgery

## 2016-12-31 NOTE — Progress Notes (Deleted)
    Post-operative EVAR   History of Present Illness   [name] is a 76 y.o. female who presents post-op s/p EVAR for sx AAA (11/24/16).  Her intra-operative studies also demonstrated L RAS >70%  Most recent EVAR duplex (***) demonstrates: *** endoleak and *** sac size.  Most recent CTA (***) demonstrates: *** endoleak and *** sac size.  The patient has *** had back or abdominal pain.  For VQI Use Only   PRE-ADM LIVING: {VQI Pre-admission Living:20973}  AMB STATUS: {VQI Ambulatory Status:20974}   Physical Examination  ***There were no vitals filed for this visit.  Vascular Vessel Right Left  Aorta Not palpable N/A  Femoral {Palpable:19197::"Not palpable","Faintly palpable","Palpable"} {Palpable:19197::"Not palpable","Faintly palpable","Palpable"}    Gastrointestinal soft, {Distension:19197::"distended","non-distended"}, {TTP:19197::"TTP in *** quadrant","appropriate tenderness to palpation","non-tender to palpation"}, {Guarding:19197::"Guarding and rebound in *** quadrant","No guarding or rebound"}, {HSM:19197::"HSM present","no HSM"}, {Masses:19197::"Mass present: ***","no masses"}, {Flank:19197::"CVAT on ***","Flank bruit present on ***","no CVAT B"}, {AAA:19197::"Palpable prominent aortic pulse","No palpable prominent aortic pulse"}, {Surgical incision:19197::"Surg incisions: ***","Surgical incisions well healed"," "}     Radiology   CTA Abd & Pelvis (***)  AAA sac size: *** cm x *** cm  *** endoleak detected   Medical Decision Making   [name] is a 76 y.o. female who presents s/p EVAR.     Pt is ***symptomatic with *** sac size.  I discussed with the patient the importance of surveillance of the endograft.  The next endograft duplex will be scheduled for *** months.  The next CTA will be scheduled for *** months.  The patient will follow up at the above intervals.  Thank you for allowing us to participate in this patient's  care.   Leonides SakeBrian Cozy Veale, MD, FACS [facility] [place] Office: [phone] Pager: [phone]

## 2017-01-06 ENCOUNTER — Other Ambulatory Visit: Payer: Self-pay

## 2017-01-06 ENCOUNTER — Encounter: Payer: Self-pay | Admitting: Vascular Surgery

## 2017-01-17 ENCOUNTER — Encounter (HOSPITAL_COMMUNITY): Payer: Self-pay | Admitting: Vascular Surgery

## 2017-01-20 ENCOUNTER — Other Ambulatory Visit: Payer: Self-pay

## 2017-02-24 ENCOUNTER — Other Ambulatory Visit: Payer: Self-pay

## 2017-02-24 ENCOUNTER — Encounter: Payer: Medicare Other | Admitting: Vascular Surgery

## 2017-03-14 NOTE — Progress Notes (Deleted)
    Post-operative EVAR   History of Present Illness   Sue MacleodCarolyn Roach is a 77 y.o. female who presents post-op s/p EVAR (11/24/16) for sx AAA.  Most recent EVAR duplex (***) demonstrates: *** endoleak and *** sac size.  Most recent CTA (***) demonstrates: *** endoleak and *** sac size.  The patient has *** had back or abdominal pain.  Pt is non-compliant: did not show up for post-op CTA, did not follow up as scheduled.  Past Medical History, Past Surgical History, Social History, Family History, Medications, Allergies, and Review of Systems are unchanged from previous evaluation on 11/28/16.   For VQI Use Only   PRE-ADM LIVING: {VQI Pre-admission Living:20973}  AMB STATUS: {VQI Ambulatory Status:20974}   Physical Examination  ***There were no vitals filed for this visit.  Vascular Vessel Right Left  Aorta Not palpable N/A  Femoral {Palpable:19197::"Not palpable","Faintly palpable","Palpable"} {Palpable:19197::"Not palpable","Faintly palpable","Palpable"}    Gastrointestinal soft, {Distension:19197::"distended","non-distended"}, {TTP:19197::"TTP in *** quadrant","appropriate tenderness to palpation","non-tender to palpation"}, {Guarding:19197::"Guarding and rebound in *** quadrant","No guarding or rebound"}, {HSM:19197::"HSM present","no HSM"}, {Masses:19197::"Mass present: ***","no masses"}, {Flank:19197::"CVAT on ***","Flank bruit present on ***","no CVAT B"}, {AAA:19197::"Palpable prominent aortic pulse","No palpable prominent aortic pulse"}, {Surgical incision:19197::"Surg incisions: ***","Surgical incisions well healed"," "}     Radiology   CTA Abd & Pelvis (***)  AAA sac size: *** cm x *** cm  *** endoleak detected   Medical Decision Making   Sue MacleodCarolyn Roach is a 77 y.o. female who presents s/p EVAR.     Pt is ***symptomatic with *** sac size.  I discussed with the patient the importance of surveillance of the endograft.  The next endograft duplex  will be scheduled for *** months.  The next CTA will be scheduled for *** months.  The patient will follow up at the above intervals.  Thank you for allowing us to participate in this patient's care.   Leonides SakeBrian Saron Tweed, MD, FACS [facility] [place] Office: [phone] Pager: [phone]

## 2017-03-17 ENCOUNTER — Encounter: Payer: Medicare Other | Admitting: Vascular Surgery

## 2017-07-22 DEATH — deceased

## 2019-09-14 IMAGING — CR DG LUMBAR SPINE COMPLETE 4+V
5 series · 5 of 5 positions shown · non-contrast
Comparison: Abdomen radiographs 11/07/2016, and earlier chest
radiographs.

ADDENDUM:
Study discussed by telephone with Dr. HAFIA LADO on 11/24/2016
at 4149 hours.
CLINICAL DATA: 76-year-old female with lumbar back pain for 2 days
with no known injury.

EXAM:
LUMBAR SPINE - COMPLETE 4+ VIEW

[t l-spine a.p.]
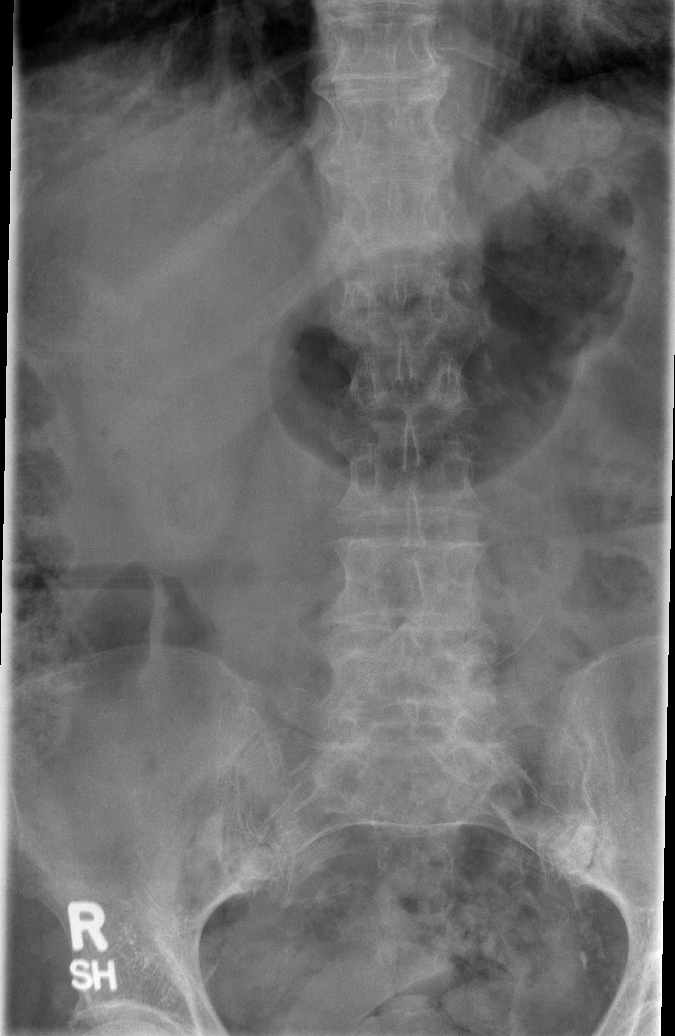

[t l-spine oblique exposure *]
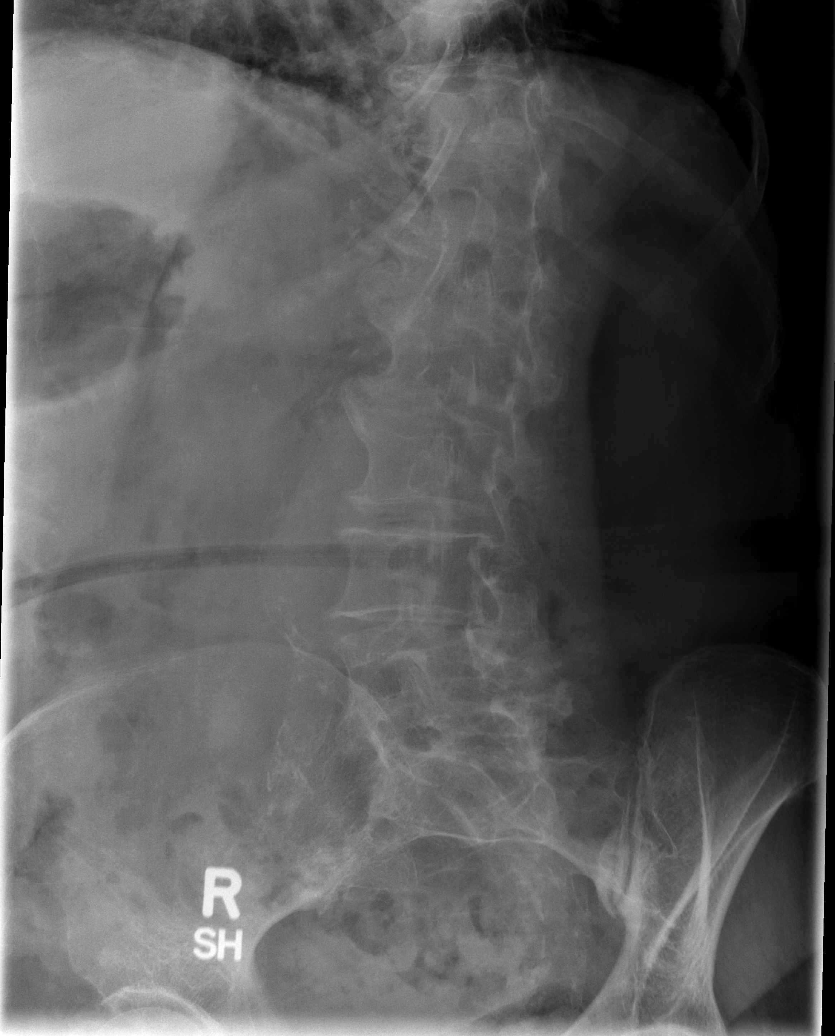

[t l-spine oblique exposure]
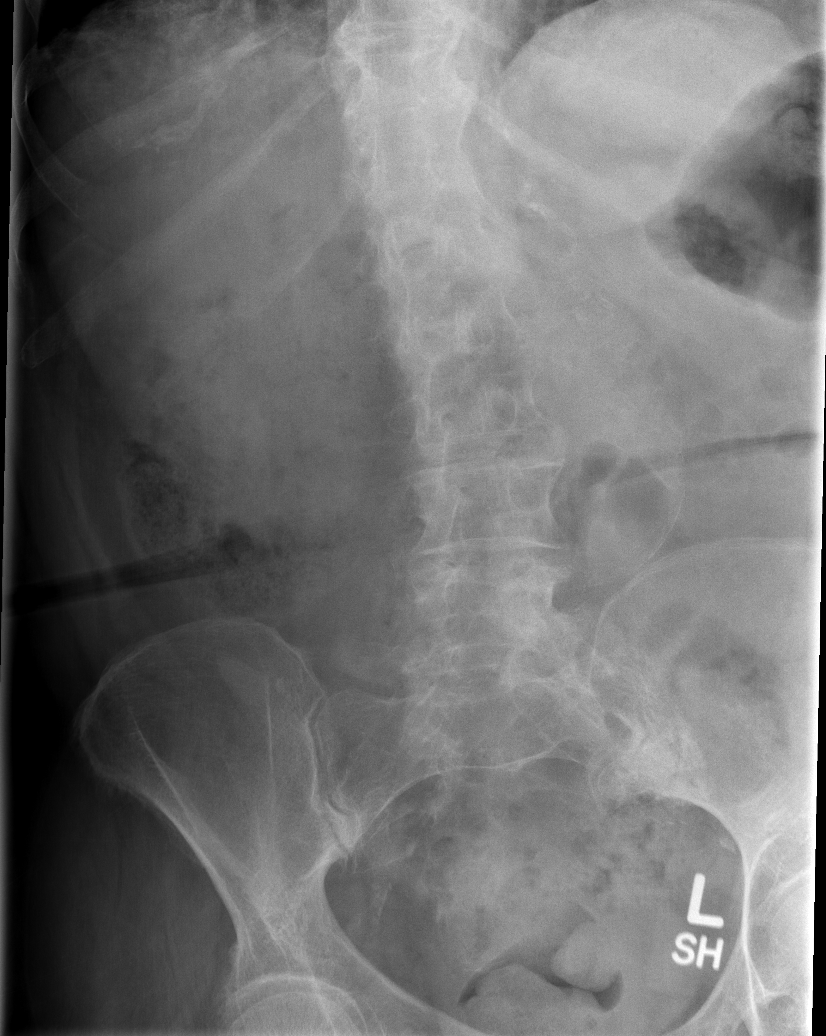

[t l-spine lat]
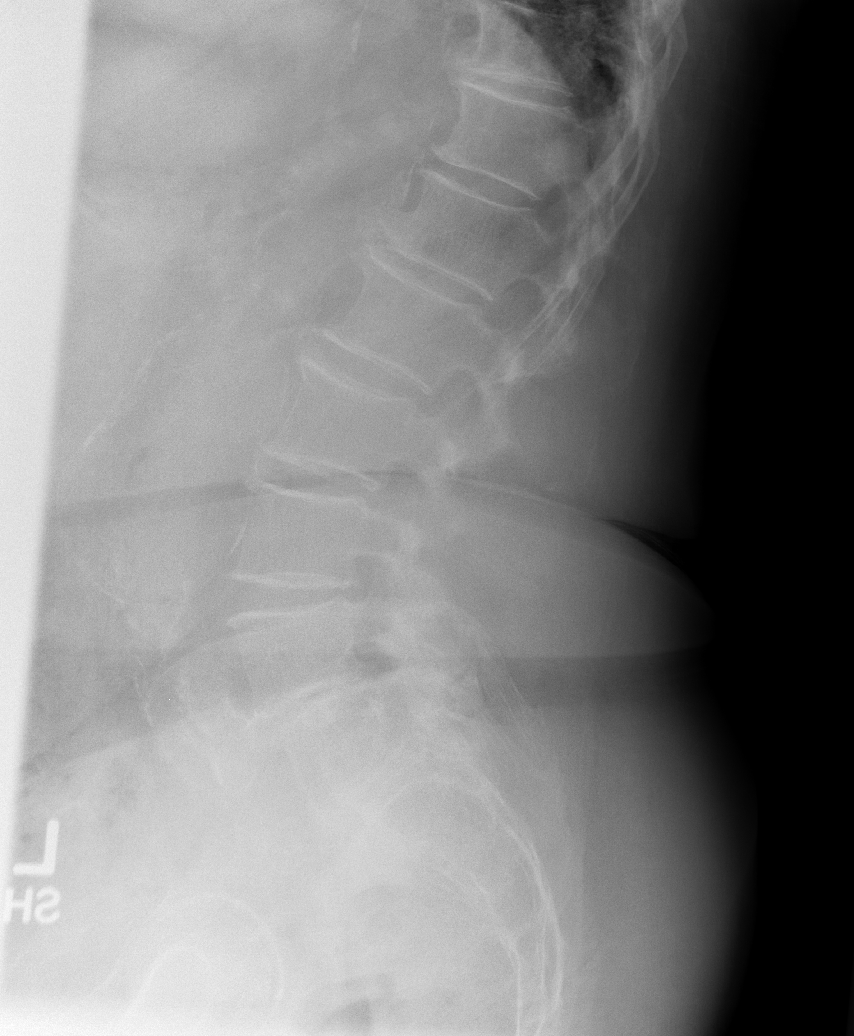

[t l-spine l5-s1 spot *]
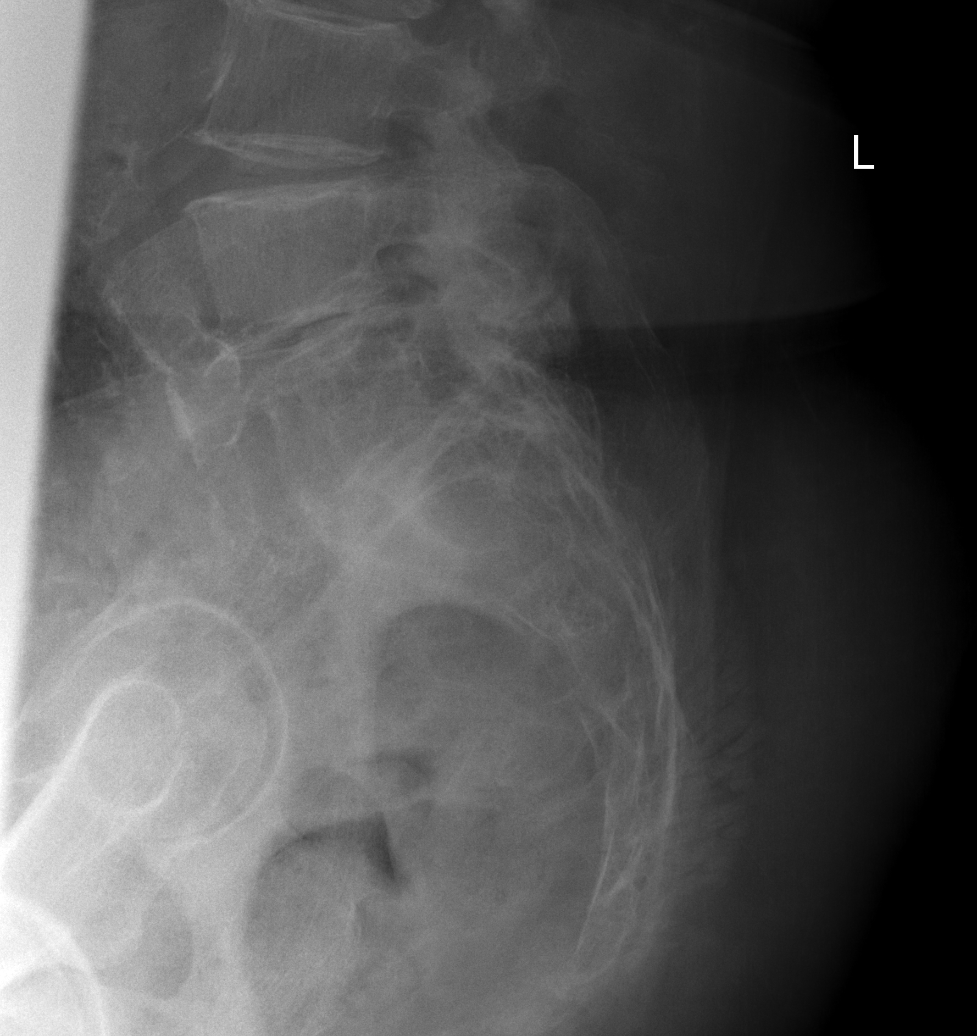

[5 of 5 positions shown; findings below may reference images not displayed]

FINDINGS: Large rim calcified infrarenal abdominal aortic aneurysm, up to 77
mm diameter judging from the lateral radiographic appearance.
Aortoiliac calcified atherosclerosis.

Partially lumbarized S1 level suspected with full size ribs at T12.
Otherwise normal lumbar segmentation. Mild grade 1 anterolisthesis
of L5 on S1 with advanced disc and posterior element degeneration.
Mild disc space loss and endplate spurring elsewhere. Degenerative
appearing sclerotic inferior bilateral SI joints. No acute osseous
abnormality identified.
IMPRESSION: 1. Very large infrarenal abdominal aortic aneurysm. Recommend
follow-up CT or CTA. Aortic Atherosclerosis ([id number]).
2.  No acute osseous abnormality identified in the lumbar spine.
